# Patient Record
Sex: Female | Born: 1950
Health system: Southern US, Community
[De-identification: ages and names within clinical notes are randomized; demographics above are authoritative.]

## PROBLEM LIST (undated history)

## (undated) DIAGNOSIS — Z8679 Personal history of other diseases of the circulatory system: Secondary | ICD-10-CM

## (undated) DIAGNOSIS — S92909A Unspecified fracture of unspecified foot, initial encounter for closed fracture: Secondary | ICD-10-CM

## (undated) DIAGNOSIS — F419 Anxiety disorder, unspecified: Secondary | ICD-10-CM

## (undated) DIAGNOSIS — S42309A Unspecified fracture of shaft of humerus, unspecified arm, initial encounter for closed fracture: Secondary | ICD-10-CM

## (undated) DIAGNOSIS — S62609A Fracture of unspecified phalanx of unspecified finger, initial encounter for closed fracture: Secondary | ICD-10-CM

## (undated) DIAGNOSIS — K5792 Diverticulitis of intestine, part unspecified, without perforation or abscess without bleeding: Secondary | ICD-10-CM

## (undated) DIAGNOSIS — S065X9A Traumatic subdural hemorrhage with loss of consciousness of unspecified duration, initial encounter: Secondary | ICD-10-CM

## (undated) DIAGNOSIS — R011 Cardiac murmur, unspecified: Secondary | ICD-10-CM

## (undated) DIAGNOSIS — H269 Unspecified cataract: Secondary | ICD-10-CM

## (undated) DIAGNOSIS — I10 Essential (primary) hypertension: Secondary | ICD-10-CM

## (undated) DIAGNOSIS — M1990 Unspecified osteoarthritis, unspecified site: Secondary | ICD-10-CM

## (undated) DIAGNOSIS — G4700 Insomnia, unspecified: Secondary | ICD-10-CM

## (undated) HISTORY — PX: EYE SURGERY: SHX253

## (undated) HISTORY — DX: Insomnia, unspecified: G47.00

## (undated) HISTORY — DX: Diverticulitis of intestine, part unspecified, without perforation or abscess without bleeding: K57.92

## (undated) HISTORY — DX: Personal history of other diseases of the circulatory system: Z86.79

## (undated) HISTORY — PX: TONSILLECTOMY: SUR1361

## (undated) HISTORY — DX: Unspecified fracture of shaft of humerus, unspecified arm, initial encounter for closed fracture: S42.309A

## (undated) HISTORY — DX: Unspecified cataract: H26.9

## (undated) HISTORY — DX: Unspecified osteoarthritis, unspecified site: M19.90

## (undated) HISTORY — DX: Fracture of unspecified phalanx of unspecified finger, initial encounter for closed fracture: S62.609A

## (undated) HISTORY — DX: Essential (primary) hypertension: I10

## (undated) HISTORY — PX: COLON SURGERY: SHX602

## (undated) HISTORY — DX: Unspecified fracture of unspecified foot, initial encounter for closed fracture: S92.909A

## (undated) HISTORY — PX: COLONOSCOPY: SHX174

---

## 1998-03-30 ENCOUNTER — Emergency Department (HOSPITAL_COMMUNITY): Admission: EM | Admit: 1998-03-30 | Discharge: 1998-03-30 | Payer: Self-pay | Admitting: Emergency Medicine

## 1998-04-08 ENCOUNTER — Other Ambulatory Visit: Admission: RE | Admit: 1998-04-08 | Discharge: 1998-04-08 | Payer: Self-pay | Admitting: *Deleted

## 1999-05-06 ENCOUNTER — Other Ambulatory Visit: Admission: RE | Admit: 1999-05-06 | Discharge: 1999-05-06 | Payer: Self-pay | Admitting: *Deleted

## 2000-06-01 ENCOUNTER — Other Ambulatory Visit: Admission: RE | Admit: 2000-06-01 | Discharge: 2000-06-01 | Payer: Self-pay | Admitting: *Deleted

## 2001-05-22 ENCOUNTER — Other Ambulatory Visit: Admission: RE | Admit: 2001-05-22 | Discharge: 2001-05-22 | Payer: Self-pay | Admitting: *Deleted

## 2001-05-24 ENCOUNTER — Encounter: Payer: Self-pay | Admitting: *Deleted

## 2001-05-24 ENCOUNTER — Encounter: Admission: RE | Admit: 2001-05-24 | Discharge: 2001-05-24 | Payer: Self-pay | Admitting: *Deleted

## 2001-07-13 ENCOUNTER — Other Ambulatory Visit: Admission: RE | Admit: 2001-07-13 | Discharge: 2001-07-13 | Payer: Self-pay | Admitting: *Deleted

## 2001-11-23 ENCOUNTER — Encounter: Payer: Self-pay | Admitting: Surgery

## 2001-11-23 ENCOUNTER — Encounter: Admission: RE | Admit: 2001-11-23 | Discharge: 2001-11-23 | Payer: Self-pay | Admitting: Surgery

## 2002-01-09 ENCOUNTER — Other Ambulatory Visit: Admission: RE | Admit: 2002-01-09 | Discharge: 2002-01-09 | Payer: Self-pay | Admitting: *Deleted

## 2002-06-05 ENCOUNTER — Other Ambulatory Visit: Admission: RE | Admit: 2002-06-05 | Discharge: 2002-06-05 | Payer: Self-pay | Admitting: Obstetrics and Gynecology

## 2002-12-05 ENCOUNTER — Encounter: Admission: RE | Admit: 2002-12-05 | Discharge: 2002-12-05 | Payer: Self-pay | Admitting: Obstetrics and Gynecology

## 2002-12-05 ENCOUNTER — Encounter: Payer: Self-pay | Admitting: Obstetrics and Gynecology

## 2003-06-24 ENCOUNTER — Other Ambulatory Visit: Admission: RE | Admit: 2003-06-24 | Discharge: 2003-06-24 | Payer: Self-pay | Admitting: Obstetrics and Gynecology

## 2003-08-12 ENCOUNTER — Ambulatory Visit (HOSPITAL_COMMUNITY): Admission: RE | Admit: 2003-08-12 | Discharge: 2003-08-12 | Payer: Self-pay | Admitting: Orthopedic Surgery

## 2003-12-09 ENCOUNTER — Encounter: Admission: RE | Admit: 2003-12-09 | Discharge: 2003-12-09 | Payer: Self-pay | Admitting: Obstetrics and Gynecology

## 2004-11-27 ENCOUNTER — Ambulatory Visit: Payer: Self-pay | Admitting: Internal Medicine

## 2004-12-03 ENCOUNTER — Ambulatory Visit: Payer: Self-pay | Admitting: Internal Medicine

## 2005-01-14 ENCOUNTER — Encounter: Admission: RE | Admit: 2005-01-14 | Discharge: 2005-01-14 | Payer: Self-pay | Admitting: Internal Medicine

## 2006-01-17 ENCOUNTER — Encounter: Admission: RE | Admit: 2006-01-17 | Discharge: 2006-01-17 | Payer: Self-pay | Admitting: Obstetrics and Gynecology

## 2006-09-08 ENCOUNTER — Ambulatory Visit: Payer: Self-pay | Admitting: Internal Medicine

## 2006-09-08 LAB — CONVERTED CEMR LAB
AST: 23 units/L (ref 0–37)
Alkaline Phosphatase: 43 units/L (ref 39–117)
Chol/HDL Ratio, serum: 2.7
Creatinine, Ser: 0.9 mg/dL (ref 0.4–1.2)
Eosinophil percent: 3.4 % (ref 0.0–5.0)
Glomerular Filtration Rate, Af Am: 83 mL/min/{1.73_m2}
HCT: 42.9 % (ref 36.0–46.0)
HDL: 74.1 mg/dL (ref >39.0)
LDL DIRECT: 121.9 mg/dL
Leukocytes, UA: NEGATIVE
MCV: 90.2 fL (ref 78.0–100.0)
Neutro Abs: 2.8 10*3/uL (ref 1.4–7.7)
Neutrophils Relative %: 48.8 % (ref 43.0–77.0)
Nitrite: NEGATIVE
Potassium: 3.9 meq/L (ref 3.5–5.1)
RBC: 4.75 M/uL (ref 3.87–5.11)
RDW: 12.8 % (ref 11.5–14.6)
Sodium: 140 meq/L (ref 135–145)
Urobilinogen, UA: 0.2 (ref 0.0–1.0)
WBC: 5.8 10*3/uL (ref 4.5–10.5)

## 2006-09-15 ENCOUNTER — Ambulatory Visit: Payer: Self-pay | Admitting: Internal Medicine

## 2006-09-16 ENCOUNTER — Ambulatory Visit: Payer: Self-pay

## 2006-09-16 ENCOUNTER — Encounter: Payer: Self-pay | Admitting: Cardiology

## 2007-01-25 ENCOUNTER — Encounter: Admission: RE | Admit: 2007-01-25 | Discharge: 2007-01-25 | Payer: Self-pay | Admitting: Obstetrics and Gynecology

## 2007-11-07 ENCOUNTER — Telehealth: Payer: Self-pay | Admitting: Internal Medicine

## 2007-11-10 ENCOUNTER — Ambulatory Visit: Payer: Self-pay | Admitting: Internal Medicine

## 2007-11-10 LAB — CONVERTED CEMR LAB
ALT: 21 units/L (ref 0–35)
AST: 25 units/L (ref 0–37)
BUN: 16 mg/dL (ref 6–23)
Basophils Absolute: 0 10*3/uL (ref 0.0–0.1)
Bilirubin Urine: NEGATIVE
Bilirubin, Direct: 0.1 mg/dL (ref 0.0–0.3)
CO2: 31 meq/L (ref 19–32)
CRP, High Sensitivity: 1 (ref 0.00–5.00)
Calcium: 9.4 mg/dL (ref 8.4–10.5)
Creatinine, Ser: 0.7 mg/dL (ref 0.4–1.2)
Eosinophils Absolute: 0.3 10*3/uL (ref 0.0–0.6)
Glucose, Bld: 93 mg/dL (ref 70–99)
HDL: 72.9 mg/dL (ref >39.0)
LDL Cholesterol: 112 mg/dL — ABNORMAL HIGH (ref 0–99)
MCHC: 33.1 g/dL (ref 30.0–36.0)
Monocytes Relative: 9 % (ref 3.0–11.0)
Neutro Abs: 2.6 10*3/uL (ref 1.4–7.7)
Neutrophils Relative %: 46.6 % (ref 43.0–77.0)
Nitrite: NEGATIVE
Sodium: 141 meq/L (ref 135–145)
TSH: 2.1 microintl units/mL (ref 0.35–5.50)
Total Bilirubin: 1 mg/dL (ref 0.3–1.2)
Total Protein: 6.5 g/dL (ref 6.0–8.3)
Triglycerides: 65 mg/dL (ref 0–149)
Urobilinogen, UA: 0.2 (ref 0.0–1.0)
VLDL: 13 mg/dL (ref 0–40)
Vit D, 1,25-Dihydroxy: 41 (ref 30–89)
WBC: 5.6 10*3/uL (ref 4.5–10.5)

## 2007-11-15 ENCOUNTER — Encounter: Payer: Self-pay | Admitting: *Deleted

## 2007-11-15 DIAGNOSIS — S92909A Unspecified fracture of unspecified foot, initial encounter for closed fracture: Secondary | ICD-10-CM

## 2007-11-15 DIAGNOSIS — S42309A Unspecified fracture of shaft of humerus, unspecified arm, initial encounter for closed fracture: Secondary | ICD-10-CM | POA: Insufficient documentation

## 2007-11-15 DIAGNOSIS — M1990 Unspecified osteoarthritis, unspecified site: Secondary | ICD-10-CM | POA: Insufficient documentation

## 2007-11-15 DIAGNOSIS — S62609A Fracture of unspecified phalanx of unspecified finger, initial encounter for closed fracture: Secondary | ICD-10-CM | POA: Insufficient documentation

## 2007-11-15 DIAGNOSIS — Z9089 Acquired absence of other organs: Secondary | ICD-10-CM | POA: Insufficient documentation

## 2007-11-15 DIAGNOSIS — S2239XA Fracture of one rib, unspecified side, initial encounter for closed fracture: Secondary | ICD-10-CM | POA: Insufficient documentation

## 2007-11-15 DIAGNOSIS — Z8679 Personal history of other diseases of the circulatory system: Secondary | ICD-10-CM

## 2007-11-15 DIAGNOSIS — I10 Essential (primary) hypertension: Secondary | ICD-10-CM

## 2007-11-15 DIAGNOSIS — A389 Scarlet fever, uncomplicated: Secondary | ICD-10-CM | POA: Insufficient documentation

## 2007-11-15 HISTORY — DX: Unspecified fracture of shaft of humerus, unspecified arm, initial encounter for closed fracture: S42.309A

## 2007-11-15 HISTORY — DX: Personal history of other diseases of the circulatory system: Z86.79

## 2007-11-15 HISTORY — DX: Unspecified fracture of unspecified foot, initial encounter for closed fracture: S92.909A

## 2007-11-15 HISTORY — DX: Fracture of unspecified phalanx of unspecified finger, initial encounter for closed fracture: S62.609A

## 2007-11-15 HISTORY — DX: Essential (primary) hypertension: I10

## 2007-11-15 HISTORY — DX: Unspecified osteoarthritis, unspecified site: M19.90

## 2007-11-27 ENCOUNTER — Ambulatory Visit: Payer: Self-pay | Admitting: Internal Medicine

## 2008-01-09 ENCOUNTER — Telehealth: Payer: Self-pay | Admitting: Internal Medicine

## 2008-02-06 ENCOUNTER — Encounter: Admission: RE | Admit: 2008-02-06 | Discharge: 2008-02-06 | Payer: Self-pay | Admitting: Obstetrics and Gynecology

## 2008-06-06 ENCOUNTER — Telehealth: Payer: Self-pay | Admitting: Internal Medicine

## 2008-07-26 ENCOUNTER — Telehealth: Payer: Self-pay | Admitting: Internal Medicine

## 2008-09-13 LAB — HM DEXA SCAN

## 2008-12-10 ENCOUNTER — Encounter (INDEPENDENT_AMBULATORY_CARE_PROVIDER_SITE_OTHER): Payer: Self-pay | Admitting: *Deleted

## 2009-01-28 ENCOUNTER — Ambulatory Visit: Payer: Self-pay | Admitting: Internal Medicine

## 2009-02-06 ENCOUNTER — Encounter: Admission: RE | Admit: 2009-02-06 | Discharge: 2009-02-06 | Payer: Self-pay | Admitting: Obstetrics and Gynecology

## 2009-02-06 ENCOUNTER — Telehealth: Payer: Self-pay | Admitting: Internal Medicine

## 2009-02-11 ENCOUNTER — Ambulatory Visit: Payer: Self-pay | Admitting: Internal Medicine

## 2009-03-31 ENCOUNTER — Telehealth: Payer: Self-pay | Admitting: Internal Medicine

## 2009-05-05 ENCOUNTER — Telehealth: Payer: Self-pay | Admitting: Internal Medicine

## 2009-05-26 ENCOUNTER — Telehealth: Payer: Self-pay | Admitting: Internal Medicine

## 2009-06-10 ENCOUNTER — Ambulatory Visit: Payer: Self-pay | Admitting: Internal Medicine

## 2009-06-10 LAB — CONVERTED CEMR LAB
Bilirubin, Direct: 0 mg/dL (ref 0.0–0.3)
Cholesterol: 209 mg/dL — ABNORMAL HIGH (ref 0–200)
Eosinophils Relative: 3.6 % (ref 0.0–5.0)
Glucose, Bld: 89 mg/dL (ref 70–99)
HCT: 43.1 % (ref 36.0–46.0)
Leukocytes, UA: NEGATIVE
Lymphs Abs: 1.7 10*3/uL (ref 0.7–4.0)
MCHC: 34.3 g/dL (ref 30.0–36.0)
MCV: 92.4 fL (ref 78.0–100.0)
Monocytes Relative: 10.5 % (ref 3.0–12.0)
Nitrite: NEGATIVE
Potassium: 4.3 meq/L (ref 3.5–5.1)
RBC: 4.66 M/uL (ref 3.87–5.11)
RDW: 13 % (ref 11.5–14.6)
Sodium: 142 meq/L (ref 135–145)
Specific Gravity, Urine: 1.02 (ref 1.000–1.030)
TSH: 1.19 microintl units/mL (ref 0.35–5.50)
Triglycerides: 38 mg/dL (ref 0.0–149.0)
Urine Glucose: NEGATIVE mg/dL
Urobilinogen, UA: 0.2 (ref 0.0–1.0)
VLDL: 7.6 mg/dL (ref 0.0–40.0)
pH: 6 (ref 5.0–8.0)

## 2009-06-17 ENCOUNTER — Ambulatory Visit: Payer: Self-pay | Admitting: Internal Medicine

## 2009-06-17 DIAGNOSIS — G4700 Insomnia, unspecified: Secondary | ICD-10-CM | POA: Insufficient documentation

## 2009-06-17 HISTORY — DX: Insomnia, unspecified: G47.00

## 2009-08-21 ENCOUNTER — Telehealth: Payer: Self-pay | Admitting: Internal Medicine

## 2009-09-01 ENCOUNTER — Telehealth: Payer: Self-pay | Admitting: Internal Medicine

## 2009-10-28 ENCOUNTER — Telehealth: Payer: Self-pay | Admitting: Internal Medicine

## 2009-12-01 ENCOUNTER — Telehealth: Payer: Self-pay | Admitting: Internal Medicine

## 2010-02-18 ENCOUNTER — Encounter: Admission: RE | Admit: 2010-02-18 | Discharge: 2010-02-18 | Payer: Self-pay | Admitting: Obstetrics and Gynecology

## 2010-04-22 ENCOUNTER — Telehealth (INDEPENDENT_AMBULATORY_CARE_PROVIDER_SITE_OTHER): Payer: Self-pay | Admitting: *Deleted

## 2010-04-24 ENCOUNTER — Telehealth: Payer: Self-pay | Admitting: Internal Medicine

## 2010-04-27 ENCOUNTER — Telehealth: Payer: Self-pay | Admitting: Internal Medicine

## 2010-05-08 ENCOUNTER — Telehealth (INDEPENDENT_AMBULATORY_CARE_PROVIDER_SITE_OTHER): Payer: Self-pay | Admitting: *Deleted

## 2010-06-22 ENCOUNTER — Telehealth: Payer: Self-pay | Admitting: Internal Medicine

## 2010-10-15 NOTE — Progress Notes (Signed)
  Phone Note Refill Request Message from:  Fax from Pharmacy on June 22, 2010 10:00 AM  Refills Requested: Medication #1:  AMBIEN 10 MG TABS Take 1 tab by mouth at bedtime as needed   Last Refilled: 03/18/2010 Last ov was 06/17/2009, please Advise refill for [facility] [address]  Initial call taken by: Ami Bullins CMA,  June 22, 2010 10:01 AM  Follow-up for Phone Call        ok for refill with 5 add'l Follow-up by: Jacques Navy MD,  June 22, 2010 12:26 PM    Prescriptions: AMBIEN 10 MG TABS (ZOLPIDEM TARTRATE) Take 1 tab by mouth at bedtime as needed  #30 x 5   Entered by:   Ami Bullins CMA   Authorized by:   Jacques Navy MD   Signed by:   Bill Salinas CMA on 06/22/2010   Method used:   Telephoned to ...       [facility] [address] [phone]* (retail)       [address]       [place], [place]  [address]       Ph: [phone] or [phone]       Fax: [phone]   RxID:   [id number]

## 2010-10-15 NOTE — Progress Notes (Signed)
Summary: CALL  Phone Note Call from Patient   Caller: [phone]  Summary of Call: Patient is requesting a call back.  Initial call taken by: Lamar Sprinkles, CMA,  April 27, 2010 9:36 AM  Follow-up for Phone Call        Spoke w/pt. 1. She is [employer]. Med records needed to be sent, I see [facility] is taking care of this as of 04/22/10 phone note. Pt informed 2. She is staying out of town for short period of time. Pt will call directly if needs any refills and provide local pharm info.  Follow-up by: Lamar Sprinkles, CMA,  April 27, 2010 10:11 AM

## 2010-10-15 NOTE — Progress Notes (Signed)
Summary: Sue [name] refill  Phone Note Refill Request Message from:  Fax from Pharmacy on December 01, 2009 4:30 PM  Refills Requested: Medication #1:  AMBIEN 10 MG TABS Take 1 tab by mouth at bedtime as needed   Last Refilled: 10/28/2009 Is this ok to refill  Initial call taken by: Rock Nephew CMA,  December 01, 2009 4:30 PM  Follow-up for Phone Call        ok x 12 Follow-up by: Jacques Navy MD,  December 02, 2009 1:10 PM    Prescriptions: AMBIEN 10 MG TABS (ZOLPIDEM TARTRATE) Take 1 tab by mouth at bedtime as needed  #30 x 6   Entered by:   Lamar Sprinkles, CMA   Authorized by:   Jacques Navy MD   Signed by:   Lamar Sprinkles, CMA on 12/02/2009   Method used:   Telephoned to ...       [facility]  [address] [phone]* (retail)       [address]       [place], [place]  [address]       Ph: [phone] or [phone]       Fax: [phone]   RxID:   [id number]

## 2010-10-15 NOTE — Progress Notes (Signed)
  Phone Note Other Incoming   Request: Send information Summary of Call: Request for records received from [facility]. Request forwarded to [employer].     

## 2010-10-15 NOTE — Progress Notes (Signed)
Summary: Xanax refill  Phone Note Refill Request Message from:  Fax from Pharmacy on October 28, 2009 2:46 PM  Refills Requested: Medication #1:  XANAX 0.25 MG  TABS 1 by mouth at bedtime as needed   Dosage confirmed as above?Dosage Confirmed Next Appointment Scheduled: none Initial call taken by: Lucious Groves,  October 28, 2009 2:46 PM  Follow-up for Phone Call        OK for refill x 5 Follow-up by: Jacques Navy MD,  October 28, 2009 6:36 PM    Prescriptions: Prudy Feeler 0.25 MG  TABS (ALPRAZOLAM) 1 by mouth at bedtime as needed  #30 x 5   Entered by:   Lucious Groves   Authorized by:   Jacques Navy MD   Signed by:   Lucious Groves on 10/29/2009   Method used:   Telephoned to ...       [facility] [address] [phone]* (retail)       [address]       [place], [place]  [address]       Ph: [phone] or [phone]       Fax: [phone]   RxID:   [id number]

## 2010-10-15 NOTE — Progress Notes (Signed)
  Phone Note Other Incoming   Request: Send information Summary of Call: Request for records received from [facility]. Request forwarded to [employer].

## 2010-10-15 NOTE — Progress Notes (Signed)
Summary: Xanax refill  Phone Note Refill Request Message from:  Fax from Pharmacy on April 24, 2010 4:11 PM  Refills Requested: Medication #1:  XANAX 0.25 MG  TABS 1 by mouth at bedtime as needed Next Appointment Scheduled: none Initial call taken by: Lucious Groves CMA,  April 24, 2010 4:11 PM  Follow-up for Phone Call        ok fro refill x 5 Follow-up by: Jacques Navy MD,  April 24, 2010 4:37 PM    Prescriptions: Prudy Feeler 0.25 MG  TABS (ALPRAZOLAM) 1 by mouth at bedtime as needed  #30 x 5   Entered by:   Lucious Groves CMA   Authorized by:   Jacques Navy MD   Signed by:   Lucious Groves CMA on 04/24/2010   Method used:   Telephoned to ...       [facility] [address] [phone]* (retail)       [address]       [place], [place]  [address]       Ph: [phone] or [phone]       Fax: [phone]   RxID:   [id number]

## 2010-10-30 ENCOUNTER — Telehealth: Payer: Self-pay | Admitting: Internal Medicine

## 2010-11-02 ENCOUNTER — Telehealth: Payer: Self-pay | Admitting: Internal Medicine

## 2010-11-03 ENCOUNTER — Telehealth (INDEPENDENT_AMBULATORY_CARE_PROVIDER_SITE_OTHER): Payer: Self-pay | Admitting: *Deleted

## 2010-11-10 NOTE — Progress Notes (Signed)
Summary: PA-Celebrex  Phone Note From Pharmacy   Summary of Call: PA-Celebrex, called [facility] @ [phone], awaiting form. Dagoberto Reef  November 03, 2010 4:01 PM Form to Dr Debby Bud to complete. Dagoberto Reef  November 03, 2010 4:05 PM   Follow-up for Phone Call        PA is required on Celebrex. I do not see that pt has tried alt rx's. What do you advise I put on PA form - it is req supporting info as to why this pt needs this rx and what they have tried in the past.  Follow-up by: Lamar Sprinkles, CMA,  November 04, 2010 1:26 PM  Additional Follow-up for Phone Call Additional follow up Details #1::        [facility] drug of choice - call and offer her a trial of meloxicam 7.5mg  once daily or she may need to pay for the celebrex since we will not get PA with her current record. Additional Follow-up by: Jacques Navy MD,  November 04, 2010 5:02 PM    Additional Follow-up for Phone Call Additional follow up Details #2::    No answer.........................Marland KitchenLamar Sprinkles, CMA  November 04, 2010 5:58 PM   Additional Follow-up for Phone Call Additional follow up Details #3:: Details for Additional Follow-up Action Taken: spoke with pt and informed her. She states that she will pay out of pocket if medication is not too expensive. I informed pt that I would send in refill and if medication was too expensive she will call to see if we have samples Additional Follow-up by: Ami Bullins CMA,  November 06, 2010 8:48 AM  Prescriptions: CELEBREX 200 MG  CAPS (CELECOXIB) Take as needed and as directed.- pt overdur for office visit  #30 Capsule x 1   Entered by:   Ami Bullins CMA   Authorized by:   Jacques Navy MD   Signed by:   Bill Salinas CMA on 11/06/2010   Method used:   Electronically to        [facility] [address] [phone]* (retail)       [address]       [place], [place]  [address]       Ph: [phone] or [phone]       Fax: [phone]   RxID:    [id number]

## 2010-11-10 NOTE — Progress Notes (Signed)
Summary: Ambien  Phone Note From Pharmacy   Caller: [facility] [address] [phone]* Summary of Call: per pharm pt has been taking 1/2 and pharm is unable to get a scored tablet. They are requesting to change  to Ambien 5mg  take up to 2 at bedtime as needed # 60 with 4Rf that were left on file. Initial call taken by: Lanier Prude, [facility]),  November 02, 2010 3:09 PM    New/Updated Medications: AMBIEN 5 MG TABS (ZOLPIDEM TARTRATE) take up to 2 at bedtime as needed Prescriptions: AMBIEN 5 MG TABS (ZOLPIDEM TARTRATE) take up to 2 at bedtime as needed  #60 x 4   Entered by:   Lanier Prude, [facility])   Authorized by:   Jacques Navy MD   Signed by:   Lanier Prude, CMA(AAMA) on 11/02/2010   Method used:   Historical   RxID:   [id number]

## 2010-11-10 NOTE — Progress Notes (Signed)
Summary: RF  Phone Note Refill Request Message from:  Pharmacy  Refills Requested: Medication #1:  XANAX 0.25 MG  TABS 1 by mouth at bedtime as needed [facility] [phone]  Initial call taken by: Lamar Sprinkles, CMA,  October 30, 2010 5:53 PM  Follow-up for Phone Call        ok to refill x 5 Follow-up by: Jacques Navy MD,  October 30, 2010 5:58 PM    Prescriptions: Prudy Feeler 0.25 MG  TABS (ALPRAZOLAM) 1 by mouth at bedtime as needed  #30 x 5   Entered by:   Lamar Sprinkles, CMA   Authorized by:   Jacques Navy MD   Signed by:   Lamar Sprinkles, CMA on 11/02/2010   Method used:   Telephoned to ...       [facility]  [facility] [address] [phone]* (retail)       [address]       [place], [place]  [address]       Ph: [phone] or [phone]       Fax: [phone]   RxID:   [id number]

## 2010-11-10 NOTE — Progress Notes (Signed)
Summary: Refill request  Phone Note From Pharmacy   Caller: [facility]  [facility] [address] [phone]* Call For: Alprazolam 0.25mg   Summary of Call: Pharmacy is requesting Alprazolam 0.25mg  tablet WGN:FAOZ 1 tablet QHs  as needed Disp: 30 w/3 refills/sls,cma Initial call taken by: Burnard Leigh [facility]),  November 02, 2010 2:33 PM  Follow-up for Phone Call        DONE - see other phone note Follow-up by: Lamar Sprinkles, CMA,  November 02, 2010 6:39 PM

## 2011-01-11 ENCOUNTER — Other Ambulatory Visit: Payer: Self-pay | Admitting: Obstetrics & Gynecology

## 2011-01-11 DIAGNOSIS — Z1231 Encounter for screening mammogram for malignant neoplasm of breast: Secondary | ICD-10-CM

## 2011-01-29 NOTE — Assessment & Plan Note (Signed)
[facility]                           PRIMARY CARE OFFICE NOTE   GIFT, RUECKERT                    MRN:          [id number]  DATE:09/15/2006                            DOB:          [birth year]    Sue [name] is a delightful 60 year old woman who presents today for  followup evaluation and exam. She was last seen in the office December 03, 2004, please see that complete dictation for past medical history and  family history.   The patient reports that she is feeling well and doing well with no  active medical complaints.   INTERVAL SOCIAL HISTORY:  The patient continues in a 6-year relationship  which is rewarding and satisfying. She does report she continues to go  to [facility] [place] on an annual basis for rejuvenation, which is a  solo trip. 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   CURRENT MEDICATIONS:  1. HCTZ 12.5 mg daily.  2. Potassium daily.  3. Multivitamin.  4. Calcium.  5. Toprol XL 25 mg 1/2-tablet daily.  6. Celebrex 200 mg daily p.r.n.  7. Menest estrogen 1/2 tablet daily.  8. Glucosamine daily.  9. Xanax 0.25 1/2-tablet q.h.s. p.r.n.  10.Ambien 10 mg, a fraction of a table q.h.s. p.r.n.   HEALTH MAINTENANCE:  The patient is current and up-to-date with Dr.  Floyde Parkins with her last pelvic and Pap in May 2007, last  mammogram in May 2007 which was a normal study. The patient's last  colonoscopy was June 2000 by Dr. Lina Sar and she would be considered  due for followup study in 2010.   REVIEW OF SYSTEMS:  The patient has had no constitutional problems and  reports her sleep habit is better. She has had an eye exam in the last  12 months which was normal. No ENT or dental problems. She has  occasional mild flutter with no history of mitral valve prolapse with  her last 2-D echo from January 1992 which revealed mild redundancy of  the anterior leaflet with mild  prolapse of both the anterior and  posterior leaflets and otherwise a normal study. She has had no  respiratory, GI, GU or musculoskeletal complaints.   PHYSICAL EXAMINATION:  VITAL SIGNS:  Temperature was 98.1, blood  pressure 125/77, pulse 53, weight 132.  GENERAL:  This is a well-nourished, well-developed woman looking younger  than her stated chronologic age in no acute distress.  HEENT:  Normocephalic, atraumatic. EACs and TMs were unremarkable.  Oropharynx with native dentition in good repair. No buccal or palatal  lesions were noted. The posterior pharynx was clear. Conjunctiva and  sclera was clear. PERRLA, EOMI. Funduscopic exam was unremarkable.  NECK:  Supple without thyromegaly. No adenopathy was noted in the  cervical or supraclavicular regions.  CHEST:  No CVA tenderness.  LUNGS:  Clear with no rales, wheezes or rhonchi.  BREASTS:  Deferred to gynecology.  CARDIOVASCULAR:  2+ radial pulse, no JVD or carotid bruits. She had a  quiet precordium with a regular rate and rhythm without murmurs, rubs or  gallops.  ABDOMEN:  Soft, no guarding, no rebound, no organosplenomegaly was  noted.  PELVIC/RECTAL:  Deferred to gynecology and GI.  EXTREMITIES:  Without clubbing, cyanosis, edema or deformity.  NEUROLOGIC:  Nonfocal.   DATABASE:  Hemoglobin was 15 grams, white count was 5800 with a normal  differential. Chemistries were normal with a blood sugar of 95. Kidney  function normal with a creatinine of 0.9 and GFR of 69, liver functions  were normal. Cholesterol was 201, triglycerides 47, HDL was 74.1. LDL  was 121.9. Thyroid function was normal with a TSH of 2.01, urinalysis  was negative.   ASSESSMENT/PLAN:  1. Hypertension. Excellent control on minimal medications. The patient      will continue on this regimen.  2. Osteoarthritis with known cervical spurs. The patient is actually      doing very well, remaining very active. She has no limitations in      her daily  activities and gets good relief with Celebrex when needed      and glucosamine.  3. Health maintenance. The patient is current and up-to-date with her      gynecologist and is on hormone replacement therapy. I believe she      has had a bone density study in the last 2 years which was      excellent which is concordant with her physical appearance and      level of activity. The patient did have a stress study in 2000      which was normal and unremarkable. The patient would be a candidate      for followup 2-D echo to evaluate and follow her mitral valve      prolapse and this is scheduled for her at [facility] for      Friday January 4.   The patient is given a tetanus booster at today's visit which is  necessary and appropriate given her equestrian hobbies and the fact that  she is at risk for soil contaminated injury.   In summary, this is a delightful patient who is medically fit and in  good condition with a normal examination and normal labs. She is asked  to return to see me for a general physical examination in 2 years. She  should see me otherwise on an as needed basis.     Sue Gess Norins, MD  Electronically Signed   MEN/MedQ  DD: 09/16/2006  DT: 09/16/2006  Job #: [id number]   cc:   Elisabeth Most A. Rosalio Macadamia, M.D.

## 2011-02-04 ENCOUNTER — Telehealth: Payer: Self-pay | Admitting: *Deleted

## 2011-02-04 ENCOUNTER — Other Ambulatory Visit: Payer: Self-pay | Admitting: *Deleted

## 2011-02-04 NOTE — Telephone Encounter (Signed)
[facility]ax from [facility] [phone] Zolpidem Tartrate 5 mg tablet. SIG take up to 2 tablets at bedtime QTY 60 last fill 11/02/2010. Please Advise refills

## 2011-02-05 MED ORDER — ZOLPIDEM TARTRATE 5 MG PO TABS: 5.0000 mg | ORAL_TABLET | Freq: Two times a day (BID) | ORAL | Status: DC | PRN

## 2011-02-05 NOTE — Telephone Encounter (Signed)
Ok for refill x 5. Reminder: due for ov/cpx Oct '12

## 2011-02-23 ENCOUNTER — Ambulatory Visit: Payer: Self-pay

## 2011-02-24 ENCOUNTER — Ambulatory Visit
Admission: RE | Admit: 2011-02-24 | Discharge: 2011-02-24 | Disposition: A | Discharge status: Home / Self Care | Payer: PRIVATE HEALTH INSURANCE | Source: Ambulatory Visit | Attending: Obstetrics & Gynecology | Admitting: Obstetrics & Gynecology

## 2011-02-24 DIAGNOSIS — Z1231 Encounter for screening mammogram for malignant neoplasm of breast: Secondary | ICD-10-CM

## 2011-05-10 ENCOUNTER — Telehealth: Payer: Self-pay | Admitting: *Deleted

## 2011-05-10 MED ORDER — ALPRAZOLAM 0.25 MG PO TABS: 0.2500 mg | ORAL_TABLET | Freq: Every evening | ORAL | Status: DC | PRN

## 2011-05-10 NOTE — Telephone Encounter (Signed)
Request for Alprazolam 0.25 mg QTY 30 SIG 1 tablet at bedtime prn  Last filled 03/08/2011. Please Advise refills

## 2011-05-10 NOTE — Telephone Encounter (Signed)
Ok with 5 refills 

## 2011-07-15 ENCOUNTER — Telehealth: Payer: Self-pay | Admitting: *Deleted

## 2011-07-15 ENCOUNTER — Ambulatory Visit: Payer: PRIVATE HEALTH INSURANCE

## 2011-07-15 ENCOUNTER — Other Ambulatory Visit: Payer: Self-pay | Admitting: Internal Medicine

## 2011-07-15 ENCOUNTER — Other Ambulatory Visit: Payer: PRIVATE HEALTH INSURANCE

## 2011-07-15 DIAGNOSIS — Z202 Contact with and (suspected) exposure to infections with a predominantly sexual mode of transmission: Secondary | ICD-10-CM

## 2011-07-15 DIAGNOSIS — Z1159 Encounter for screening for other viral diseases: Secondary | ICD-10-CM

## 2011-07-15 DIAGNOSIS — Z0000 Encounter for general adult medical examination without abnormal findings: Secondary | ICD-10-CM

## 2011-07-15 LAB — HEPATIC FUNCTION PANEL
AST: 23 U/L (ref 0–37)
Alkaline Phosphatase: 47 U/L (ref 39–117)
Total Protein: 6.5 g/dL (ref 6.0–8.3)

## 2011-07-15 LAB — CBC WITH DIFFERENTIAL/PLATELET
Basophils Absolute: 0 10*3/uL (ref 0.0–0.1)
Basophils Relative: 0.4 % (ref 0.0–3.0)
Eosinophils Relative: 4.3 % (ref 0.0–5.0)
HCT: 41.2 % (ref 36.0–46.0)
Hemoglobin: 14 g/dL (ref 12.0–15.0)
Lymphocytes Relative: 38.4 % (ref 12.0–46.0)
Lymphs Abs: 2 10*3/uL (ref 0.7–4.0)
MCHC: 33.9 g/dL (ref 30.0–36.0)
Neutro Abs: 2.6 10*3/uL (ref 1.4–7.7)
RBC: 4.45 Mil/uL (ref 3.87–5.11)
WBC: 5.3 10*3/uL (ref 4.5–10.5)

## 2011-07-15 LAB — BASIC METABOLIC PANEL
CO2: 29 mEq/L (ref 19–32)
Chloride: 104 mEq/L (ref 96–112)
Creatinine, Ser: 0.9 mg/dL (ref 0.4–1.2)
Potassium: 3.9 mEq/L (ref 3.5–5.1)
Sodium: 140 mEq/L (ref 135–145)

## 2011-07-15 LAB — URINALYSIS
Leukocytes, UA: NEGATIVE
Nitrite: NEGATIVE
Specific Gravity, Urine: 1.015 (ref 1.000–1.030)

## 2011-07-15 LAB — LIPID PANEL
LDL Cholesterol: 120 mg/dL — ABNORMAL HIGH (ref 0–99)
Triglycerides: 29 mg/dL (ref 0.0–149.0)
VLDL: 5.8 mg/dL (ref 0.0–40.0)

## 2011-07-15 LAB — TSH: TSH: 2.33 u[IU]/mL (ref 0.35–5.50)

## 2011-07-15 NOTE — Telephone Encounter (Signed)
K. Order entered 

## 2011-07-15 NOTE — Telephone Encounter (Signed)
Pt had lab work drawn this morning for her physical. She came up and filled out walk in form stating that she would like to be tested for Hep C. Can I fill out add on form for this test to be ran. Please Advise

## 2011-07-20 ENCOUNTER — Ambulatory Visit (INDEPENDENT_AMBULATORY_CARE_PROVIDER_SITE_OTHER): Payer: PRIVATE HEALTH INSURANCE | Admitting: Internal Medicine

## 2011-07-20 VITALS — BP 110/70 | HR 65 | Temp 98.3°F | Wt 131.0 lb

## 2011-07-20 DIAGNOSIS — Z0000 Encounter for general adult medical examination without abnormal findings: Secondary | ICD-10-CM

## 2011-07-20 DIAGNOSIS — M1990 Unspecified osteoarthritis, unspecified site: Secondary | ICD-10-CM

## 2011-07-20 DIAGNOSIS — G4700 Insomnia, unspecified: Secondary | ICD-10-CM

## 2011-07-20 DIAGNOSIS — Z136 Encounter for screening for cardiovascular disorders: Secondary | ICD-10-CM

## 2011-07-20 DIAGNOSIS — I10 Essential (primary) hypertension: Secondary | ICD-10-CM

## 2011-07-20 MED ORDER — DESOXIMETASONE 0.25 % EX OINT: TOPICAL_OINTMENT | CUTANEOUS | Status: DC

## 2011-07-22 ENCOUNTER — Encounter: Payer: Self-pay | Admitting: Internal Medicine

## 2011-07-22 NOTE — Assessment & Plan Note (Signed)
Relatively minor symptoms well controlled with prn Celebrex

## 2011-07-22 NOTE — Assessment & Plan Note (Signed)
Interval medical history is benign. Physical exam is normal. She is current with gynecologist and mammography. She is current with colorectal cancer screening with last exam June '10. Lab results are in normal range. Immunizations: Tetanus Jan '08; declines flu and shingles vaccines.  In summary- a delightful woman who is medically stable and healthy. She is asked to return as needed or in 2 years for routine exam.

## 2011-07-22 NOTE — Assessment & Plan Note (Signed)
Continues to be a poor sleeper but she does well enough to be highly functional. Will still take the occasional small dose of alprazolam as needed.

## 2011-07-22 NOTE — Assessment & Plan Note (Signed)
BP Readings from Last 3 Encounters:  07/20/11 110/70  06/17/09 136/72  11/27/07 128/74   Good control on very low dose diuretic. Renal function and electrolytes are normal.  Plan - continue present regimen

## 2011-07-22 NOTE — Progress Notes (Signed)
Subjective:    Patient ID: Sue [name], female    DOB: 01/01/1951, 60 y.o.   MRN: [id number]  HPI Ms. Escajeda presents for routine medical exam. In the interval since her last exam she has been doing well with no major illness, no injury and no surgery. She remains a very active sportswoman. On occasion she will have sleep trouble for which she takes low dose medication. On occasion she will take NSAIDs for generalized discomfort. She is current with her gynecologist and has kept current with mammography.  Past Medical History  Diagnosis Date  . FRACTURE, ARM, LEFT 11/15/2007  . FRACTURE, FOOT 11/15/2007  . FRACTURE, INDEX FINGER, LEFT 11/15/2007  . HYPERTENSION 11/15/2007  . INSOMNIA, CHRONIC 06/17/2009  . MITRAL VALVE PROLAPSE, HX OF 11/15/2007  . OSTEOARTHRITIS 11/15/2007  . RIB FRACTURE 11/15/2007  . Scarlet fever 11/15/2007  . TONSILLECTOMY, HX OF 11/15/2007   Past Surgical History  Procedure Date  . Tonsillectomy    Family History  Problem Relation Age of Onset  . Parkinsonism Other    History   Social History  . Marital Status: Divorced    Spouse Name: N/A    Number of Children: N/A  . Years of Education: N/A   Occupational History  . Not on file.   Social History Main Topics  . Smoking status: Not on file  . Smokeless tobacco: Not on file  . Alcohol Use:   . Drug Use:   . Sexually Active:    Other Topics Concern  . Not on file   Social History Narrative   [employer] [place] then [place]; remarried '90 - 4 yrs divorced. 1 boy - '75; 1 daughter '83. Single partner since 2001. Socially active, riding horses not hunting but jumping little jumps. Lost her mother Spring '12 - doing OK with loss.       Review of Systems Constitutional:  Negative for fever, chills, activity change and unexpected weight change.  HEENT:  Negative for hearing loss, ear pain, congestion, neck stiffness and postnasal drip. Negative for sore throat or swallowing  problems. Negative for dental complaints.   Eyes: Negative for vision loss or change in visual acuity.  Respiratory: Negative for chest tightness and wheezing. Negative for DOE.   Cardiovascular: Negative for chest pain or palpitations. No decreased exercise tolerance Gastrointestinal: No change in bowel habit. No bloating or gas. No reflux or indigestion Genitourinary: Negative for urgency, frequency, flank pain and difficulty urinating.  Musculoskeletal: Negative for myalgias, back pain, arthralgias and gait problem.  Neurological: Negative for dizziness, tremors, weakness and headaches.  Hematological: Negative for adenopathy.  Psychiatric/Behavioral: Negative for behavioral problems and dysphoric mood.       Objective:   Physical Exam Vitals reviewed -  normal. Gen'l: well nourished, well developed white woman in no distress HEENT - Gays/AT, EACs/TMs normal, oropharynx with native dentition in good condition, no buccal or palatal lesions, posterior pharynx clear, mucous membranes moist. C&S clear, PERRLA, fundi - normal Neck - supple, no thyromegaly Nodes- negative submental, cervical, supraclavicular regions Chest - no deformity, no CVAT Lungs - cleat without rales, wheezes. No increased work of breathing Breast - deferred to gyn Cardiovascular - regular rate and rhythm, quiet precordium, no murmurs, rubs or gallops, 2+ radial, DP and PT pulses Abdomen - BS+ x 4, no HSM, no guarding or rebound or tenderness Pelvic - deferred to gyn Rectal - deferred to gyn Extremities - no clubbing, cyanosis, edema or deformity.  Neuro -  A&O x 3, CN II-XII normal, motor strength normal and equal, DTRs 2+ and symmetrical biceps, radial, and patellar tendons. Cerebellar - no tremor, no rigidity, fluid movement and normal gait. Derm - Head, neck, back, abdomen and extremities without suspicious lesions  Lab Results  Component Value Date   WBC 5.3 07/15/2011   HGB 14.0 07/15/2011   HCT 41.2 07/15/2011     PLT 265.0 07/15/2011   GLUCOSE 81 07/15/2011   CHOL 197 07/15/2011   TRIG 29.0 07/15/2011   HDL 71.60 07/15/2011   LDLDIRECT 131.1 06/10/2009   LDLCALC 120* 07/15/2011   ALT 19 07/15/2011   AST 23 07/15/2011   NA 140 07/15/2011   K 3.9 07/15/2011   CL 104 07/15/2011   CREATININE 0.9 07/15/2011   BUN 22 07/15/2011   CO2 29 07/15/2011   TSH 2.33 07/15/2011           Assessment & Plan:  Apthous ulcer - minor ulcer following halloween indiscretion  Plan - topicort 0.25% ointment to ulcer bid. Marland Kitchen

## 2011-07-31 ENCOUNTER — Encounter: Payer: Self-pay | Admitting: Internal Medicine

## 2011-08-12 ENCOUNTER — Telehealth: Payer: Self-pay | Admitting: *Deleted

## 2011-08-12 DIAGNOSIS — N83299 Other ovarian cyst, unspecified side: Secondary | ICD-10-CM

## 2011-08-12 NOTE — Telephone Encounter (Signed)
Pt had question on CA125 test for ovarian cancer, pt would like to speak with you directly

## 2011-08-16 NOTE — Telephone Encounter (Signed)
Patient called back & said thank-you for trying to get in touch w/her and sorry you haven't been able to reach her and could you try again on her mobile at [phone].

## 2011-08-16 NOTE — Telephone Encounter (Signed)
Called cell #. Discussed test and its value. She wishes to go ahead. Order entered.

## 2011-08-19 ENCOUNTER — Other Ambulatory Visit: Payer: PRIVATE HEALTH INSURANCE

## 2011-08-19 DIAGNOSIS — Z202 Contact with and (suspected) exposure to infections with a predominantly sexual mode of transmission: Secondary | ICD-10-CM

## 2011-08-20 LAB — HEPATITIS C ANTIBODY: HCV Ab: NEGATIVE

## 2011-08-23 ENCOUNTER — Encounter: Payer: Self-pay | Admitting: Internal Medicine

## 2011-08-26 ENCOUNTER — Other Ambulatory Visit: Payer: Self-pay | Admitting: *Deleted

## 2011-08-26 MED ORDER — METOPROLOL SUCCINATE ER 25 MG PO TB24: 25.0000 mg | ORAL_TABLET | Freq: Every day | ORAL | Status: DC

## 2011-08-26 NOTE — Telephone Encounter (Signed)
Request for Metoprolol refill. Done. LMOM to inform patient.

## 2011-09-21 ENCOUNTER — Telehealth: Payer: Self-pay | Admitting: *Deleted

## 2011-09-21 DIAGNOSIS — N83209 Unspecified ovarian cyst, unspecified side: Secondary | ICD-10-CM

## 2011-09-21 NOTE — Telephone Encounter (Signed)
Patient requesting results of CA125 labs. [facility] reports they did not receive this lab. Inquiry to [facility] lab, spoke w/Vickie; stated that lab was not drawn due to error in order entry, stated order was not placed as 'future' and that if this was corrected and the patient came back in to have lab drawn that it would be sent out to [facility]. Called patient to inform that we had a "error in transition with the lab" and we apologized to have inconvenienced her and to have not gotten this information to her earlier. Informed patient that order has been re-placed and she just needs to come back in to lab at her convenience & we will get this matter taken care of. Inform patient that this matter has been brought to the attention of our manager and that I will be sending detailed email as well; Pt request 'no charge' for this test due to our error, informed Pt that I would include this request in my email to manager.

## 2011-10-04 ENCOUNTER — Other Ambulatory Visit: Payer: PRIVATE HEALTH INSURANCE

## 2011-10-04 DIAGNOSIS — N83209 Unspecified ovarian cyst, unspecified side: Secondary | ICD-10-CM

## 2011-10-05 ENCOUNTER — Encounter: Payer: Self-pay | Admitting: Internal Medicine

## 2011-10-05 LAB — CA 125: CA 125: 6.8 U/mL (ref 0.0–30.2)

## 2011-11-30 ENCOUNTER — Other Ambulatory Visit: Payer: Self-pay | Admitting: Internal Medicine

## 2011-11-30 NOTE — Telephone Encounter (Signed)
Celebrex Prn request [last refill 02.24.12 #30x1]

## 2011-12-20 ENCOUNTER — Other Ambulatory Visit: Payer: Self-pay | Admitting: *Deleted

## 2011-12-20 MED ORDER — ZOLPIDEM TARTRATE 5 MG PO TABS: 5.0000 mg | ORAL_TABLET | Freq: Two times a day (BID) | ORAL | Status: DC | PRN

## 2011-12-20 MED ORDER — ALPRAZOLAM 0.25 MG PO TABS: 0.2500 mg | ORAL_TABLET | Freq: Every evening | ORAL | Status: DC | PRN

## 2011-12-20 NOTE — Telephone Encounter (Signed)
Alprazolam request [last refill 08.27.12 #60x5] Zolpidem request [last refill 05.25.12 #60x5]

## 2011-12-20 NOTE — Telephone Encounter (Signed)
Done

## 2011-12-20 NOTE — Telephone Encounter (Signed)
Ok to refill both zolpidem and alprazolam x 5

## 2011-12-23 ENCOUNTER — Telehealth: Payer: Self-pay | Admitting: *Deleted

## 2011-12-23 NOTE — Telephone Encounter (Signed)
Celebex is not covered under patient's insurance plan, acceptable alternatives are as follows: Mobic, Naproxen, Voltaren are covered. Please advise.

## 2011-12-31 NOTE — Telephone Encounter (Signed)
Recommend meloxicam 7.5 mg as an alternative to celebrex.  In european trials this was considered a COX II drug, as is celebrex. It is generally well tolerated and effective.

## 2012-01-04 MED ORDER — CELECOXIB 200 MG PO CAPS: 200.0000 mg | ORAL_CAPSULE | Freq: Every day | ORAL | Status: DC | PRN

## 2012-01-04 NOTE — Telephone Encounter (Signed)
Celebrex D/C per MEN; new Rx to pharmacy: After speaking with patient, I was informed that she has been paying out of pocket for Celebrex and would like to continue to; asked pt to please inform pharmacy of this, so no further prior authorizations will be requested, pt agreed. Added Celebrex back to patient's med list at patient request/SLS

## 2012-02-01 ENCOUNTER — Other Ambulatory Visit: Payer: Self-pay | Admitting: Internal Medicine

## 2012-02-01 ENCOUNTER — Other Ambulatory Visit: Payer: Self-pay | Admitting: *Deleted

## 2012-02-01 MED ORDER — HYDROCHLOROTHIAZIDE 25 MG PO TABS: 12.5000 mg | ORAL_TABLET | Freq: Every day | ORAL | Status: DC

## 2012-02-01 NOTE — Telephone Encounter (Signed)
REFILL MED CALLED TO [facility].

## 2012-02-02 NOTE — Telephone Encounter (Signed)
rX  SENT TO PHARMACY 

## 2012-02-03 ENCOUNTER — Telehealth: Payer: Self-pay | Admitting: *Deleted

## 2012-02-03 ENCOUNTER — Other Ambulatory Visit: Payer: Self-pay | Admitting: Obstetrics & Gynecology

## 2012-02-03 DIAGNOSIS — Z1231 Encounter for screening mammogram for malignant neoplasm of breast: Secondary | ICD-10-CM

## 2012-02-03 NOTE — Telephone Encounter (Signed)
Patient called to state [facility] Of   Celebrex prior authorization. .States she will pay out of pocket for this as she does not take it that often to go to the trouble of getting a PA.

## 2012-03-07 ENCOUNTER — Ambulatory Visit
Admission: RE | Admit: 2012-03-07 | Discharge: 2012-03-07 | Disposition: A | Discharge status: Home / Self Care | Payer: PRIVATE HEALTH INSURANCE | Source: Ambulatory Visit | Attending: Obstetrics & Gynecology | Admitting: Obstetrics & Gynecology

## 2012-03-07 DIAGNOSIS — Z1231 Encounter for screening mammogram for malignant neoplasm of breast: Secondary | ICD-10-CM

## 2012-07-14 ENCOUNTER — Other Ambulatory Visit: Payer: Self-pay | Admitting: Internal Medicine

## 2012-07-14 NOTE — Telephone Encounter (Signed)
Patient request refill om medication alprazolam. Dr. Debby Bud Patient. Last OV 07/20/2011. Please advise.

## 2012-07-18 ENCOUNTER — Telehealth: Payer: Self-pay | Admitting: *Deleted

## 2012-07-18 MED ORDER — ALPRAZOLAM 0.25 MG PO TABS: 0.2500 mg | ORAL_TABLET | Freq: Every evening | ORAL | Status: DC | PRN

## 2012-07-18 NOTE — Telephone Encounter (Signed)
Refill for alprazolam called to [facility] pharmacy.

## 2012-07-18 NOTE — Telephone Encounter (Signed)
OK for refill x 5. 

## 2012-07-18 NOTE — Telephone Encounter (Signed)
Patient request refill on alprazolam. Did not get response from Dr. Posey Rea on Friday. Please advise

## 2012-08-14 ENCOUNTER — Other Ambulatory Visit: Payer: Self-pay | Admitting: Internal Medicine

## 2012-10-10 ENCOUNTER — Other Ambulatory Visit: Payer: Self-pay | Admitting: Internal Medicine

## 2012-12-26 DIAGNOSIS — D171 Benign lipomatous neoplasm of skin and subcutaneous tissue of trunk: Secondary | ICD-10-CM | POA: Insufficient documentation

## 2013-01-17 ENCOUNTER — Other Ambulatory Visit: Payer: Self-pay | Admitting: Internal Medicine

## 2013-01-17 NOTE — Telephone Encounter (Signed)
Alprazolam called to pharmacy.   Lm for pt letting her know script was called in and she will need an office visit before further refills

## 2013-01-17 NOTE — Telephone Encounter (Signed)
Ok to refill 30d, no refill (per protocol covering for absent PCP) - prescription printed and signed -  Let pt know this is last rx until OV scheduled as last OV 07/2011

## 2013-01-30 ENCOUNTER — Other Ambulatory Visit: Payer: Self-pay

## 2013-01-30 DIAGNOSIS — Z1231 Encounter for screening mammogram for malignant neoplasm of breast: Secondary | ICD-10-CM

## 2013-03-11 ENCOUNTER — Other Ambulatory Visit: Payer: Self-pay | Admitting: Internal Medicine

## 2013-03-21 ENCOUNTER — Other Ambulatory Visit: Payer: Self-pay | Admitting: Internal Medicine

## 2013-03-21 NOTE — Telephone Encounter (Signed)
Alprazolam called to pharmacy  

## 2013-03-26 ENCOUNTER — Ambulatory Visit: Payer: PRIVATE HEALTH INSURANCE

## 2013-03-29 ENCOUNTER — Ambulatory Visit
Admission: RE | Admit: 2013-03-29 | Discharge: 2013-03-29 | Disposition: A | Discharge status: Home / Self Care | Payer: PRIVATE HEALTH INSURANCE | Source: Ambulatory Visit

## 2013-03-29 DIAGNOSIS — Z1231 Encounter for screening mammogram for malignant neoplasm of breast: Secondary | ICD-10-CM

## 2013-05-06 ENCOUNTER — Other Ambulatory Visit: Payer: Self-pay | Admitting: Internal Medicine

## 2013-06-25 ENCOUNTER — Other Ambulatory Visit: Payer: Self-pay | Admitting: Internal Medicine

## 2013-07-16 ENCOUNTER — Ambulatory Visit (INDEPENDENT_AMBULATORY_CARE_PROVIDER_SITE_OTHER): Payer: PRIVATE HEALTH INSURANCE | Admitting: Internal Medicine

## 2013-07-16 ENCOUNTER — Encounter: Payer: Self-pay | Admitting: Internal Medicine

## 2013-07-16 VITALS — BP 134/68 | HR 59 | Temp 97.4°F | Ht 64.5 in | Wt 132.0 lb

## 2013-07-16 DIAGNOSIS — Z0000 Encounter for general adult medical examination without abnormal findings: Secondary | ICD-10-CM

## 2013-07-16 DIAGNOSIS — G4700 Insomnia, unspecified: Secondary | ICD-10-CM

## 2013-07-16 DIAGNOSIS — I10 Essential (primary) hypertension: Secondary | ICD-10-CM

## 2013-07-16 MED ORDER — ZOLPIDEM TARTRATE 5 MG PO TABS: ORAL_TABLET | ORAL | Status: DC

## 2013-07-16 MED ORDER — METOPROLOL SUCCINATE ER 25 MG PO TB24: ORAL_TABLET | ORAL | Status: DC

## 2013-07-16 MED ORDER — DICLOFENAC SODIUM 75 MG PO TBEC: 75.0000 mg | DELAYED_RELEASE_TABLET | Freq: Two times a day (BID) | ORAL | Status: DC

## 2013-07-16 MED ORDER — HYDROCHLOROTHIAZIDE 25 MG PO TABS: ORAL_TABLET | ORAL | Status: DC

## 2013-07-16 MED ORDER — ALPRAZOLAM 0.25 MG PO TABS: ORAL_TABLET | ORAL | Status: DC

## 2013-07-16 NOTE — Progress Notes (Signed)
Subjective:    Patient ID: Sue [name], female    DOB: 28-Nov-1950, 62 y.o.   MRN: [id number]  HPI Mrs. Rakestraw presents for routine medical exam. Interval history is unremarkable except she had an episode of what sounds like muscle fasciculations of the gracilis/vastus medialis. She also had good results using voltaren for pain relief.  She is current with he gyn. She is current with colorectal cancer and breast cancer screening.   Past Medical History  Diagnosis Date  . FRACTURE, ARM, LEFT 11/15/2007  . FRACTURE, FOOT 11/15/2007  . FRACTURE, INDEX FINGER, LEFT 11/15/2007  . HYPERTENSION 11/15/2007  . INSOMNIA, CHRONIC 06/17/2009  . MITRAL VALVE PROLAPSE, HX OF 11/15/2007  . OSTEOARTHRITIS 11/15/2007  . RIB FRACTURE 11/15/2007  . Scarlet fever 11/15/2007  . TONSILLECTOMY, HX OF 11/15/2007   Past Surgical History  Procedure Laterality Date  . Tonsillectomy     Family History  Problem Relation Age of Onset  . Parkinsonism Other    History   Social History  . Marital Status: Divorced    Spouse Name: N/A    Number of Children: N/A  . Years of Education: N/A   Occupational History  . Not on file.   Social History Main Topics  . Smoking status: Never Smoker   . Smokeless tobacco: Not on file  . Alcohol Use: Yes     Comment: rare alcohol use  . Drug Use: No  . Sexual Activity: Not on file   Other Topics Concern  . Not on file   Social History Narrative   [place], [place]   Married '72-17 yrs then divorced; remarried '90 - 4 yrs divorced. 1 boy - '75; 1 daughter '83. Single partner since 2001. Socially active, riding horses not hunting but jumping little jumps. Lost her mother Spring '12 - doing OK with loss.    Current Outpatient Prescriptions on File Prior to Visit  Medication Sig Dispense Refill  . Chelated Potassium 99 MG TABS Take by mouth as needed.        . Desoximetasone (TOPICORT) 0.25 % ointment Apply bid for aphtous ulcer  15 g  2  . Esterified Estrogens  (MENEST) 0.3 MG tablet take 1/2 tablet my mouth every other day       . GLUCOSAMINE HCL PO Take by mouth daily.        . Melatonin 5 MG CAPS Take by mouth as needed.        . metoprolol succinate (TOPROL-XL) 25 MG 24 hr tablet TAKE 1 TABLET BY MOUTH EVERY DAY  30 tablet  0  . Multiple Vitamin (MULTIVITAMIN) capsule Take 1 capsule by mouth daily.         No current facility-administered medications on file prior to visit.      Review of Systems System review is negative for any constitutional, cardiac, pulmonary, GI or neuro symptoms or complaints other than as described in the HPI.     Objective:   Physical Exam Filed Vitals:   07/16/13 1341  BP: 134/68  Pulse: 59  Temp: 97.4 F (36.3 C)   Wt Readings from Last 3 Encounters:  07/16/13 132 lb (59.875 kg)  07/20/11 131 lb (59.421 kg)  06/17/09 130 lb (58.968 kg)   Gen'l: well nourished, well developed Woman in no distress HEENT - [place]/AT, EACs/TMs normal, oropharynx with native dentition in good condition, no buccal or palatal lesions, posterior pharynx clear, mucous membranes moist. C&S clear, PERRLA, fundi - normal Neck - supple, no  thyromegaly Nodes- negative submental, cervical, supraclavicular regions Chest - no deformity, no CVAT Lungs - clear without rales, wheezes. No increased work of breathing Breast - deferred Cardiovascular - regular rate and rhythm, quiet precordium, no murmurs, rubs or gallops, 2+ radial, DP and PT pulses Abdomen - BS+ x 4, no HSM, no guarding or rebound or tenderness Pelvic - deferred to gyn Rectal - deferred to gyn Extremities - no clubbing, cyanosis, edema or deformity.  Neuro - A&O x 3, CN II-XII normal, motor strength normal and equal, DTRs 2+ and symmetrical biceps, radial, and patellar tendons. Cerebellar - no tremor, no rigidity, fluid movement and normal gait. Derm - Head, neck, back, abdomen and extremities without suspicious lesions  Labs ordered and pending      Assessment & Plan:

## 2013-07-16 NOTE — Patient Instructions (Signed)
Good to see you and you look unchanged from the last visit.   Your exam is normal. We will get routine labs and the results will be posted to MyChart.  Your are current for gyn care and cancer screening. You have a current Tetanus. I understand you reluctance to have shingles vaccine but you may want to give this consideration as time goes on and there is normal immune senescence.   I will be referring you to Dr. Posey Rea as I retire.  Stay well, the best medicine.

## 2013-07-17 NOTE — Assessment & Plan Note (Signed)
BP Readings from Last 3 Encounters:  07/16/13 134/68  07/20/11 110/70  06/17/09 136/72   Very good control on low dose medication that is well tolerated  Plan Routine lab: Bmet  Rx refilled

## 2013-07-17 NOTE — Assessment & Plan Note (Signed)
Interval history is unremarkable for any serious illness, injury or surgery. Limited physical exam, sans breast and pelvic, is normal. She is current with her gynecologist, current with colorectal and breast cancer screening. Immunizations - offered Tdap and Zostavax - declined. Lab is ordered and pending: Cmet, Lipid, CBC.  In summary A very healthy and active woman who invests in her health. She is asked to return in 2 years or sooner as needed. She understands that she will be reassigned to another physician.

## 2013-07-17 NOTE — Assessment & Plan Note (Signed)
Stable sleep habit with occasional need for zolpidem.   Plan Rx- renewed

## 2013-07-19 ENCOUNTER — Other Ambulatory Visit: Payer: Self-pay

## 2013-07-19 ENCOUNTER — Other Ambulatory Visit (INDEPENDENT_AMBULATORY_CARE_PROVIDER_SITE_OTHER): Payer: PRIVATE HEALTH INSURANCE

## 2013-07-19 DIAGNOSIS — Z0000 Encounter for general adult medical examination without abnormal findings: Secondary | ICD-10-CM

## 2013-07-19 LAB — CBC WITH DIFFERENTIAL/PLATELET
Basophils Absolute: 0 10*3/uL (ref 0.0–0.1)
Eosinophils Absolute: 0.2 10*3/uL (ref 0.0–0.7)
HCT: 41.9 % (ref 36.0–46.0)
Hemoglobin: 14.3 g/dL (ref 12.0–15.0)
Lymphocytes Relative: 35.7 % (ref 12.0–46.0)
Lymphs Abs: 2.1 10*3/uL (ref 0.7–4.0)
MCHC: 34.1 g/dL (ref 30.0–36.0)
Monocytes Absolute: 0.5 10*3/uL (ref 0.1–1.0)
Neutro Abs: 3.1 10*3/uL (ref 1.4–7.7)
RDW: 14.1 % (ref 11.5–14.6)

## 2013-07-19 LAB — LIPID PANEL
Cholesterol: 209 mg/dL — ABNORMAL HIGH (ref 0–200)
HDL: 81.4 mg/dL (ref >39.00)
VLDL: 9 mg/dL (ref 0.0–40.0)

## 2013-07-19 LAB — COMPREHENSIVE METABOLIC PANEL
Albumin: 4.1 g/dL (ref 3.5–5.2)
CO2: 30 mEq/L (ref 19–32)
Creatinine, Ser: 0.7 mg/dL (ref 0.4–1.2)
GFR: 97.36 mL/min (ref >60.00)
Glucose, Bld: 92 mg/dL (ref 70–99)
Potassium: 3.8 mEq/L (ref 3.5–5.1)
Sodium: 138 mEq/L (ref 135–145)
Total Bilirubin: 0.5 mg/dL (ref 0.3–1.2)
Total Protein: 7 g/dL (ref 6.0–8.3)

## 2013-07-19 LAB — HEPATIC FUNCTION PANEL
AST: 21 U/L (ref 0–37)
Alkaline Phosphatase: 51 U/L (ref 39–117)
Total Bilirubin: 0.5 mg/dL (ref 0.3–1.2)

## 2013-09-21 ENCOUNTER — Other Ambulatory Visit: Payer: Self-pay | Admitting: Internal Medicine

## 2013-12-28 ENCOUNTER — Other Ambulatory Visit: Payer: Self-pay | Admitting: *Deleted

## 2013-12-28 MED ORDER — ALPRAZOLAM 0.25 MG PO TABS: ORAL_TABLET | ORAL | Status: DC

## 2013-12-28 NOTE — Telephone Encounter (Signed)
[facility],/lmb 

## 2013-12-31 ENCOUNTER — Other Ambulatory Visit: Payer: Self-pay | Admitting: Obstetrics & Gynecology

## 2013-12-31 DIAGNOSIS — N644 Mastodynia: Secondary | ICD-10-CM

## 2014-01-07 ENCOUNTER — Ambulatory Visit
Admission: RE | Admit: 2014-01-07 | Discharge: 2014-01-07 | Disposition: A | Discharge status: Home / Self Care | Payer: No Typology Code available for payment source | Source: Ambulatory Visit | Attending: Obstetrics & Gynecology | Admitting: Obstetrics & Gynecology

## 2014-01-07 DIAGNOSIS — N644 Mastodynia: Secondary | ICD-10-CM

## 2014-03-11 DIAGNOSIS — M6740 Ganglion, unspecified site: Secondary | ICD-10-CM | POA: Diagnosis not present

## 2014-03-29 ENCOUNTER — Telehealth: Payer: Self-pay | Admitting: *Deleted

## 2014-03-29 MED ORDER — ZOLPIDEM TARTRATE 5 MG PO TABS: ORAL_TABLET | ORAL | Status: DC

## 2014-03-29 NOTE — Telephone Encounter (Signed)
OK to fill 30 d prescription with additional refills x0 Sch OV w/a new PCP pls Thank you!

## 2014-03-29 NOTE — Telephone Encounter (Signed)
Faxed script bck to pharmacy...Sue [name]

## 2014-03-29 NOTE — Telephone Encounter (Signed)
Received fax pt requesting refills on her zolpidem. Last ov CPX 07/2013 w/Dr. Linda Hedges...Johny Chess

## 2014-04-04 DIAGNOSIS — M6740 Ganglion, unspecified site: Secondary | ICD-10-CM | POA: Diagnosis not present

## 2014-04-16 ENCOUNTER — Other Ambulatory Visit: Payer: Self-pay

## 2014-04-16 DIAGNOSIS — Z1231 Encounter for screening mammogram for malignant neoplasm of breast: Secondary | ICD-10-CM

## 2014-05-06 ENCOUNTER — Ambulatory Visit (INDEPENDENT_AMBULATORY_CARE_PROVIDER_SITE_OTHER): Payer: Medicare Other | Admitting: Podiatry

## 2014-05-06 ENCOUNTER — Ambulatory Visit (INDEPENDENT_AMBULATORY_CARE_PROVIDER_SITE_OTHER): Payer: Medicare Other

## 2014-05-06 ENCOUNTER — Encounter: Payer: Self-pay | Admitting: Podiatry

## 2014-05-06 VITALS — BP 124/74 | HR 52 | Resp 14 | Ht 64.5 in | Wt 129.0 lb

## 2014-05-06 DIAGNOSIS — M7750 Other enthesopathy of unspecified foot: Secondary | ICD-10-CM | POA: Diagnosis not present

## 2014-05-06 DIAGNOSIS — M898X9 Other specified disorders of bone, unspecified site: Secondary | ICD-10-CM | POA: Diagnosis not present

## 2014-05-06 DIAGNOSIS — M79672 Pain in left foot: Secondary | ICD-10-CM

## 2014-05-06 DIAGNOSIS — M79609 Pain in unspecified limb: Secondary | ICD-10-CM

## 2014-05-06 MED ORDER — TRIAMCINOLONE ACETONIDE 10 MG/ML IJ SUSP
10.0000 mg | Freq: Once | INTRAMUSCULAR | Status: AC
  Administered 2014-05-06: 10 mg

## 2014-05-06 NOTE — Progress Notes (Signed)
   Subjective:    Patient ID: Sue [name], female    DOB: 1951/07/17, 63 y.o.   MRN: [id number]  HPI Comments: Pt states she noticed the bone on the top of her left foot is more pronounced over 1 month.  Pt states her foot becomes painful in certain shoes.  Foot Pain      Review of Systems  All other systems reviewed and are negative.      Objective:   Physical Exam        Assessment & Plan:

## 2014-05-07 ENCOUNTER — Ambulatory Visit
Admission: RE | Admit: 2014-05-07 | Discharge: 2014-05-07 | Disposition: A | Discharge status: Home / Self Care | Payer: Medicare Other | Source: Ambulatory Visit

## 2014-05-07 DIAGNOSIS — Z1231 Encounter for screening mammogram for malignant neoplasm of breast: Secondary | ICD-10-CM | POA: Diagnosis not present

## 2014-05-07 NOTE — Progress Notes (Signed)
Subjective:     Patient ID: Sue [name], female   DOB: [birth year], 63 y.o.   MRN: [id number]  HPI patient presents with pain in the dorsum of the left foot and states that it's hard to wear certain shoes and that seems like the inflammation is gradually getting worse   Review of Systems     Objective:   Physical Exam Neurovascular status intact with muscle strength adequate and noted to have some prominence over the first metatarsocuneiform joint with fluid buildup and pain when pressed into this area    Assessment:    probable inflammatory tendinitis right mid foot with exostotic lesion that may be a part of the process along with low grade osteoarthritis    Plan:     H&P and x-ray reviewed and careful injection administered 3 mg Kenalog 5 mg Xylocaine and advised on open toed shoes for several days. If symptoms persist will reappoint and may need to consider spur resection

## 2014-06-06 DIAGNOSIS — H2510 Age-related nuclear cataract, unspecified eye: Secondary | ICD-10-CM | POA: Diagnosis not present

## 2014-06-11 ENCOUNTER — Telehealth: Payer: Self-pay | Admitting: *Deleted

## 2014-06-11 ENCOUNTER — Other Ambulatory Visit: Payer: Self-pay | Admitting: Internal Medicine

## 2014-06-11 MED ORDER — ALPRAZOLAM 0.25 MG PO TABS: ORAL_TABLET | ORAL | Status: DC

## 2014-06-11 NOTE — Telephone Encounter (Signed)
Will refill for #30 no refills.

## 2014-06-11 NOTE — Telephone Encounter (Signed)
Rf req for Alprazolam 0.25 mg 1 po qhs prn.[name] to Rf in PCP absence?

## 2014-07-11 ENCOUNTER — Other Ambulatory Visit: Payer: Self-pay | Admitting: Internal Medicine

## 2014-07-11 DIAGNOSIS — M2142 Flat foot [pes planus] (acquired), left foot: Secondary | ICD-10-CM | POA: Diagnosis not present

## 2014-07-11 DIAGNOSIS — M79672 Pain in left foot: Secondary | ICD-10-CM | POA: Diagnosis not present

## 2014-07-11 NOTE — Telephone Encounter (Signed)
Refill done. Pt has a future appt with dr. Alain Marion on 11.4.15

## 2014-07-17 ENCOUNTER — Ambulatory Visit (INDEPENDENT_AMBULATORY_CARE_PROVIDER_SITE_OTHER): Payer: Medicare Other | Admitting: Internal Medicine

## 2014-07-17 ENCOUNTER — Encounter: Payer: Self-pay | Admitting: Internal Medicine

## 2014-07-17 VITALS — BP 150/74 | HR 58 | Temp 98.1°F | Ht 64.5 in | Wt 133.0 lb

## 2014-07-17 DIAGNOSIS — Z0000 Encounter for general adult medical examination without abnormal findings: Secondary | ICD-10-CM | POA: Diagnosis not present

## 2014-07-17 DIAGNOSIS — E785 Hyperlipidemia, unspecified: Secondary | ICD-10-CM

## 2014-07-17 DIAGNOSIS — G4700 Insomnia, unspecified: Secondary | ICD-10-CM

## 2014-07-17 MED ORDER — METOPROLOL SUCCINATE ER 25 MG PO TB24: ORAL_TABLET | ORAL | Status: DC

## 2014-07-17 MED ORDER — ALPRAZOLAM 0.25 MG PO TABS: 0.2500 mg | ORAL_TABLET | Freq: Two times a day (BID) | ORAL | Status: DC | PRN

## 2014-07-17 MED ORDER — DICLOFENAC SODIUM 75 MG PO TBEC: 75.0000 mg | DELAYED_RELEASE_TABLET | Freq: Two times a day (BID) | ORAL | Status: DC

## 2014-07-17 MED ORDER — ZOLPIDEM TARTRATE 5 MG PO TABS: ORAL_TABLET | ORAL | Status: DC

## 2014-07-17 MED ORDER — HYDROCHLOROTHIAZIDE 25 MG PO TABS: ORAL_TABLET | ORAL | Status: DC

## 2014-07-17 NOTE — Progress Notes (Signed)
   Subjective:    Patient ID: Sue [name], female    DOB: [birth year], 63 y.o.   MRN: [id number]  HPI  The patient is here for a wellness exam. The patient has been doing well overall without major physical or psychological issues going on lately.  F/u HTN, insomnia. BP is ok at home.   Review of Systems  Constitutional: Negative for fever, chills, diaphoresis, activity change, appetite change, fatigue and unexpected weight change.  HENT: Negative for congestion, dental problem, ear pain, hearing loss, mouth sores, postnasal drip, sinus pressure, sneezing, sore throat and voice change.   Eyes: Negative for pain and visual disturbance.  Respiratory: Negative for cough, chest tightness, wheezing and stridor.   Cardiovascular: Negative for chest pain, palpitations and leg swelling.  Gastrointestinal: Negative for nausea, vomiting, abdominal pain, blood in stool, abdominal distention and rectal pain.  Genitourinary: Negative for dysuria, frequency, hematuria, decreased urine volume, vaginal bleeding, vaginal discharge, difficulty urinating, vaginal pain and menstrual problem.  Musculoskeletal: Positive for arthralgias. Negative for back pain, joint swelling, gait problem and neck pain.  Skin: Negative for color change, rash and wound.  Neurological: Negative for dizziness, tremors, syncope, speech difficulty, weakness and light-headedness.  Hematological: Negative for adenopathy.  Psychiatric/Behavioral: Negative for suicidal ideas, hallucinations, behavioral problems, confusion, sleep disturbance, dysphoric mood and decreased concentration. The patient is not nervous/anxious and is not hyperactive.        Objective:   Physical Exam  Constitutional: She appears well-developed. No distress.  HENT:  Head: Normocephalic.  Right Ear: External ear normal.  Left Ear: External ear normal.  Nose: Nose normal.  Mouth/Throat: Oropharynx is clear and moist.  Eyes: Conjunctivae are normal.  Pupils are equal, round, and reactive to light. Right eye exhibits no discharge. Left eye exhibits no discharge.  Neck: Normal range of motion. Neck supple. No JVD present. No tracheal deviation present. No thyromegaly present.  Cardiovascular: Normal rate, regular rhythm and normal heart sounds.   Pulmonary/Chest: No stridor. No respiratory distress. She has no wheezes.  Abdominal: Soft. Bowel sounds are normal. She exhibits no distension and no mass. There is no tenderness. There is no rebound and no guarding.  Musculoskeletal: She exhibits no edema or tenderness.  Lymphadenopathy:    She has no cervical adenopathy.  Neurological: She displays normal reflexes. No cranial nerve deficit. She exhibits normal muscle tone. Coordination normal.  Skin: No rash noted. No erythema.  Psychiatric: She has a normal mood and affect. Her behavior is normal. Judgment and thought content normal.    Lab Results  Component Value Date   WBC 6.0 07/19/2013   HGB 14.3 07/19/2013   HCT 41.9 07/19/2013   PLT 263.0 07/19/2013   GLUCOSE 92 07/19/2013   CHOL 209* 07/19/2013   TRIG 45.0 07/19/2013   HDL 81.40 07/19/2013   LDLDIRECT 118.2 07/19/2013   LDLCALC 120* 07/15/2011   ALT 24 07/19/2013   ALT 24 07/19/2013   AST 21 07/19/2013   AST 21 07/19/2013   NA 138 07/19/2013   K 3.8 07/19/2013   CL 101 07/19/2013   CREATININE 0.7 07/19/2013   BUN 17 07/19/2013   CO2 30 07/19/2013   TSH 2.33 07/15/2011         Assessment & Plan:

## 2014-07-17 NOTE — Assessment & Plan Note (Signed)
Continue with current prn prescription therapy as reflected on the Med list.  

## 2014-07-17 NOTE — Progress Notes (Signed)
Pre visit review using our clinic review tool, if applicable. No additional management support is needed unless otherwise documented below in the visit note. 

## 2014-07-17 NOTE — Assessment & Plan Note (Signed)
Here for medicare wellness/physical  Diet: heart healthy  Physical activity: not sedentary - tennis Depression/mood screen: negative  Hearing: intact to whispered voice  Visual acuity: grossly normal, performs annual eye exam  ADLs: capable  Fall risk: none  Home safety: good  Cognitive evaluation: intact to orientation, naming, recall and repetition  EOL planning: adv directives, full code/ I agree  I have personally reviewed and have noted  1. The patient's medical and social history  2. Their use of alcohol, tobacco or illicit drugs  3. Their current medications and supplements  4. The patient's functional ability including ADL's, fall risks, home safety risks and hearing or visual impairment.  5. Diet and physical activities  6. Evidence for depression or mood disorders    Today patient counseled on age appropriate routine health concerns for screening and prevention, each reviewed and up to date or declined. Immunizations reviewed and up to date or declined. Labs ordered and reviewed. Risk factors for depression reviewed and negative. Hearing function and visual acuity are intact. ADLs screened and addressed as needed. Functional ability and level of safety reviewed and appropriate. Education, counseling and referrals performed based on assessed risks today. Patient provided with a copy of personalized plan for preventive services.      

## 2014-07-18 ENCOUNTER — Encounter: Payer: Self-pay | Admitting: Internal Medicine

## 2014-07-23 ENCOUNTER — Other Ambulatory Visit (INDEPENDENT_AMBULATORY_CARE_PROVIDER_SITE_OTHER): Payer: Medicare Other

## 2014-07-23 DIAGNOSIS — I10 Essential (primary) hypertension: Secondary | ICD-10-CM | POA: Diagnosis not present

## 2014-07-23 DIAGNOSIS — Z0000 Encounter for general adult medical examination without abnormal findings: Secondary | ICD-10-CM | POA: Diagnosis not present

## 2014-07-23 DIAGNOSIS — G4700 Insomnia, unspecified: Secondary | ICD-10-CM

## 2014-07-23 DIAGNOSIS — E785 Hyperlipidemia, unspecified: Secondary | ICD-10-CM | POA: Diagnosis not present

## 2014-07-23 LAB — BASIC METABOLIC PANEL
BUN: 21 mg/dL (ref 6–23)
CALCIUM: 9.1 mg/dL (ref 8.4–10.5)
CO2: 25 mEq/L (ref 19–32)
Chloride: 105 mEq/L (ref 96–112)
Creatinine, Ser: 0.8 mg/dL (ref 0.4–1.2)
GFR: 82.28 mL/min (ref >60.00)
Glucose, Bld: 89 mg/dL (ref 70–99)
POTASSIUM: 4 meq/L (ref 3.5–5.1)
Sodium: 142 mEq/L (ref 135–145)

## 2014-07-23 LAB — LIPID PANEL
Cholesterol: 202 mg/dL — ABNORMAL HIGH (ref 0–200)
HDL: 65.5 mg/dL
LDL Cholesterol: 130 mg/dL — ABNORMAL HIGH (ref 0–99)
NonHDL: 136.5
Total CHOL/HDL Ratio: 3
Triglycerides: 34 mg/dL (ref 0.0–149.0)
VLDL: 6.8 mg/dL (ref 0.0–40.0)

## 2014-07-23 LAB — CBC WITH DIFFERENTIAL/PLATELET
BASOS ABS: 0 10*3/uL (ref 0.0–0.1)
Basophils Relative: 0.7 % (ref 0.0–3.0)
Eosinophils Absolute: 0.3 10*3/uL (ref 0.0–0.7)
Eosinophils Relative: 4.5 % (ref 0.0–5.0)
HEMATOCRIT: 41.2 % (ref 36.0–46.0)
Hemoglobin: 13.5 g/dL (ref 12.0–15.0)
LYMPHS ABS: 2.2 10*3/uL (ref 0.7–4.0)
Lymphocytes Relative: 38 % (ref 12.0–46.0)
MCHC: 32.9 g/dL (ref 30.0–36.0)
MCV: 91.4 fl (ref 78.0–100.0)
Monocytes Absolute: 0.6 10*3/uL (ref 0.1–1.0)
Monocytes Relative: 11.3 % (ref 3.0–12.0)
Neutro Abs: 2.6 10*3/uL (ref 1.4–7.7)
Neutrophils Relative %: 45.5 % (ref 43.0–77.0)
PLATELETS: 271 10*3/uL (ref 150.0–400.0)
RBC: 4.5 Mil/uL (ref 3.87–5.11)
RDW: 14.1 % (ref 11.5–15.5)
WBC: 5.7 10*3/uL (ref 4.0–10.5)

## 2014-07-23 LAB — URINALYSIS, ROUTINE W REFLEX MICROSCOPIC
Bilirubin Urine: NEGATIVE
Hgb urine dipstick: NEGATIVE
Ketones, ur: NEGATIVE
Nitrite: NEGATIVE
PH: 7 (ref 5.0–8.0)
SPECIFIC GRAVITY, URINE: 1.02 (ref 1.000–1.030)
Total Protein, Urine: NEGATIVE
Urine Glucose: NEGATIVE
Urobilinogen, UA: 0.2 (ref 0.0–1.0)

## 2014-07-23 LAB — HEPATIC FUNCTION PANEL
ALK PHOS: 51 U/L (ref 39–117)
ALT: 25 U/L (ref 0–35)
AST: 28 U/L (ref 0–37)
Albumin: 3.4 g/dL — ABNORMAL LOW (ref 3.5–5.2)
BILIRUBIN DIRECT: 0 mg/dL (ref 0.0–0.3)
BILIRUBIN TOTAL: 0.5 mg/dL (ref 0.2–1.2)
Total Protein: 6.7 g/dL (ref 6.0–8.3)

## 2014-07-23 LAB — TSH: TSH: 2.51 u[IU]/mL (ref 0.35–4.50)

## 2014-07-25 ENCOUNTER — Encounter: Payer: Self-pay | Admitting: Sports Medicine

## 2014-07-25 ENCOUNTER — Ambulatory Visit (INDEPENDENT_AMBULATORY_CARE_PROVIDER_SITE_OTHER): Payer: Medicare Other | Admitting: Sports Medicine

## 2014-07-25 VITALS — BP 134/66 | Ht 64.5 in | Wt 128.0 lb

## 2014-07-25 DIAGNOSIS — M19072 Primary osteoarthritis, left ankle and foot: Secondary | ICD-10-CM | POA: Insufficient documentation

## 2014-07-25 DIAGNOSIS — R269 Unspecified abnormalities of gait and mobility: Secondary | ICD-10-CM | POA: Insufficient documentation

## 2014-07-25 NOTE — Progress Notes (Signed)
  Sue [name] - 63 y.o. female MRN [id number]  Date of birth: 08-09-1951  SUBJECTIVE:  Including CC & ROS.  Patient is 63 year old Caucasian female is presenting today for gait analysis and consideration for orthotics for her left mid foot pain. Patient reports having midfoot pain on her left dorsum of her foot with both the protrusion in this area causing friction and discomfort with most shoes. Patient reports that this has been a discomfort for her for approximately 6 months. She was previously seen at a foot clinic in the [place] area and received a injection into her midfoot that she did not find to be beneficial. She was seen by Dr. Noemi Chapel at [facility] orthopedic group in the last 2 weeks he personally reviewed her x-rays and agreed that she had some osteoarthritis per recommended orthotics and additional padding to help support her arch. Patient reports that her pain is on the dorsum of her foot when this bony protuberance presses up against her shoes particularly worse with walking and horseback riding. She has tried some doughnut pads on the dorsum of the foot which has helped some in distributing her force when walking and riding. She has not tried any insoles. Has not tried any topical anti-inflammatories.   ROS: Review of systems otherwise negative except for information present in HPI  HISTORY: Past Medical, Surgical, Social, and Family History Reviewed & Updated per EMR. Pertinent Historical Findings include: Mitral valve prolapse Essential hypertension  DATA REVIEWED: Personally reviewed patient's recent x-rays completed in September 2015 Lateral view of her foot shows mid foot bone spurring and osteoarthritis between the proximal first metatarsal and first cuneiform. This is most consistent with patient's bony protuberance  PHYSICAL EXAM:  VS: BP:134/66 mmHg  HR: bpm  TEMP: ( )  RESP:   HT:5' 4.5" (163.8 cm)   WT:128 lb (58.06 kg)  BMI:21.7 FOOT EXAM:  General:  well nourished Skin of LE: warm; dry, no rashes, lesions, ecchymosis or erythema. Vascular: Dorsal pedal pulses 2+ bilaterally Neurologically: Sensation to light touch lower extremities equal and intact bilaterally.  Observation - no ecchymosis, no edema, or hematoma present  Palpation: TTP over the 1st Metatarsal - cuneiform joint with a more promote protrusion from OA and bone spurring. Normal ankle motion and strength bilaterally  Extension/flexion 5/5 strength bilaterally in toes Weight-bearing foot exam:  First ray: neurtral  Second ray: slightly longer than the first Longitudinal arch: pes cavus Heel position: valgus stress Gait analysis:  Striking location: midfoot Foot motion: pronation Knee motion: neutral  Hip motion: neutral   ASSESSMENT & PLAN: See problem based charting & AVS for pt instructions.

## 2014-07-25 NOTE — Assessment & Plan Note (Signed)
Based on the patient's history of a bony prominence and pain localized to the first metatarsal first cuneiform joint, with evidence of bone spurring and osteoarthritis on her x-ray. I suspect additional support for her arch will prevent any collapse of this arch with her gait as well as additional padding to the bony protuberance.  Recommendations: -Patient was placed in green insoles without scaphoid pad (too much padding) to help confirm that additional arch support will benefit her before proceeding with custom orthotics. -Provided patient with additional arch support with a arch support strap and placed a doughnut pad over her bony protuberance to distribute force. -Advised patient to make a 1-2 week follow-up after making the subtle changes to determine if this provides her any more support or pain control. If this is supportive we'll proceed with making custom orthotics at that time to neutralize her foot and provide arch support

## 2014-08-12 DIAGNOSIS — Z01419 Encounter for gynecological examination (general) (routine) without abnormal findings: Secondary | ICD-10-CM | POA: Diagnosis not present

## 2014-08-12 DIAGNOSIS — B977 Papillomavirus as the cause of diseases classified elsewhere: Secondary | ICD-10-CM | POA: Diagnosis not present

## 2014-08-12 DIAGNOSIS — Z124 Encounter for screening for malignant neoplasm of cervix: Secondary | ICD-10-CM | POA: Diagnosis not present

## 2014-08-16 DIAGNOSIS — L812 Freckles: Secondary | ICD-10-CM | POA: Diagnosis not present

## 2014-08-16 DIAGNOSIS — D1801 Hemangioma of skin and subcutaneous tissue: Secondary | ICD-10-CM | POA: Diagnosis not present

## 2014-08-16 DIAGNOSIS — L820 Inflamed seborrheic keratosis: Secondary | ICD-10-CM | POA: Diagnosis not present

## 2014-08-16 DIAGNOSIS — L821 Other seborrheic keratosis: Secondary | ICD-10-CM | POA: Diagnosis not present

## 2014-09-18 DIAGNOSIS — H04123 Dry eye syndrome of bilateral lacrimal glands: Secondary | ICD-10-CM | POA: Diagnosis not present

## 2014-09-18 DIAGNOSIS — H52223 Regular astigmatism, bilateral: Secondary | ICD-10-CM | POA: Diagnosis not present

## 2014-09-18 DIAGNOSIS — H5203 Hypermetropia, bilateral: Secondary | ICD-10-CM | POA: Diagnosis not present

## 2014-09-18 DIAGNOSIS — H524 Presbyopia: Secondary | ICD-10-CM | POA: Diagnosis not present

## 2014-09-18 DIAGNOSIS — H25813 Combined forms of age-related cataract, bilateral: Secondary | ICD-10-CM | POA: Diagnosis not present

## 2014-12-03 DIAGNOSIS — D225 Melanocytic nevi of trunk: Secondary | ICD-10-CM | POA: Diagnosis not present

## 2014-12-03 DIAGNOSIS — D1721 Benign lipomatous neoplasm of skin and subcutaneous tissue of right arm: Secondary | ICD-10-CM | POA: Diagnosis not present

## 2014-12-03 DIAGNOSIS — D171 Benign lipomatous neoplasm of skin and subcutaneous tissue of trunk: Secondary | ICD-10-CM | POA: Diagnosis not present

## 2014-12-05 DIAGNOSIS — H524 Presbyopia: Secondary | ICD-10-CM | POA: Diagnosis not present

## 2014-12-05 DIAGNOSIS — H2513 Age-related nuclear cataract, bilateral: Secondary | ICD-10-CM | POA: Diagnosis not present

## 2014-12-05 DIAGNOSIS — H04123 Dry eye syndrome of bilateral lacrimal glands: Secondary | ICD-10-CM | POA: Diagnosis not present

## 2014-12-31 DIAGNOSIS — L309 Dermatitis, unspecified: Secondary | ICD-10-CM | POA: Diagnosis not present

## 2015-01-12 HISTORY — PX: CATARACT EXTRACTION W/ INTRAOCULAR LENS IMPLANT: SHX1309

## 2015-01-14 ENCOUNTER — Telehealth: Payer: Self-pay | Admitting: Internal Medicine

## 2015-01-14 ENCOUNTER — Other Ambulatory Visit: Payer: Self-pay | Admitting: Internal Medicine

## 2015-01-14 MED ORDER — HYDROCHLOROTHIAZIDE 25 MG PO TABS: ORAL_TABLET | ORAL | Status: DC

## 2015-01-14 NOTE — Telephone Encounter (Signed)
Patient is requesting a refill on hydrochlorothiazide (HYDRODIURIL) 25 MG tablet [[id number]] with a quantity of 100. She pays this straight out of pocket. Pharmacy is [facility] [address]

## 2015-01-14 NOTE — Telephone Encounter (Signed)
Left message that rx has been called in

## 2015-01-14 NOTE — Telephone Encounter (Signed)
Rf sent. See meds.  

## 2015-01-15 DIAGNOSIS — H2512 Age-related nuclear cataract, left eye: Secondary | ICD-10-CM | POA: Diagnosis not present

## 2015-01-27 DIAGNOSIS — H25812 Combined forms of age-related cataract, left eye: Secondary | ICD-10-CM | POA: Diagnosis not present

## 2015-01-27 DIAGNOSIS — H2512 Age-related nuclear cataract, left eye: Secondary | ICD-10-CM | POA: Diagnosis not present

## 2015-02-06 DIAGNOSIS — L812 Freckles: Secondary | ICD-10-CM | POA: Diagnosis not present

## 2015-02-06 DIAGNOSIS — D1801 Hemangioma of skin and subcutaneous tissue: Secondary | ICD-10-CM | POA: Diagnosis not present

## 2015-02-06 DIAGNOSIS — L570 Actinic keratosis: Secondary | ICD-10-CM | POA: Diagnosis not present

## 2015-02-06 DIAGNOSIS — L821 Other seborrheic keratosis: Secondary | ICD-10-CM | POA: Diagnosis not present

## 2015-03-04 ENCOUNTER — Encounter: Payer: Self-pay | Admitting: Internal Medicine

## 2015-03-04 ENCOUNTER — Encounter: Payer: Self-pay | Admitting: Gastroenterology

## 2015-03-09 ENCOUNTER — Other Ambulatory Visit: Payer: Self-pay | Admitting: Internal Medicine

## 2015-03-11 NOTE — Telephone Encounter (Signed)
Please advise, thanks.

## 2015-03-13 ENCOUNTER — Other Ambulatory Visit: Payer: Self-pay

## 2015-03-13 MED ORDER — ZOLPIDEM TARTRATE 5 MG PO TABS: 5.0000 mg | ORAL_TABLET | Freq: Every evening | ORAL | Status: DC | PRN

## 2015-03-13 MED ORDER — ALPRAZOLAM 0.25 MG PO TABS: ORAL_TABLET | ORAL | Status: DC

## 2015-03-13 NOTE — Telephone Encounter (Signed)
Xanax and ambien rx sent to [facility]

## 2015-03-14 DIAGNOSIS — S065XAA Traumatic subdural hemorrhage with loss of consciousness status unknown, initial encounter: Secondary | ICD-10-CM

## 2015-03-14 DIAGNOSIS — S065X9A Traumatic subdural hemorrhage with loss of consciousness of unspecified duration, initial encounter: Secondary | ICD-10-CM

## 2015-03-14 HISTORY — DX: Traumatic subdural hemorrhage with loss of consciousness of unspecified duration, initial encounter: S06.5X9A

## 2015-03-14 HISTORY — DX: Traumatic subdural hemorrhage with loss of consciousness status unknown, initial encounter: S06.5XAA

## 2015-03-17 ENCOUNTER — Other Ambulatory Visit: Payer: Self-pay | Admitting: Internal Medicine

## 2015-03-20 NOTE — Telephone Encounter (Signed)
Script for Ambien and Xanax called in

## 2015-03-26 DIAGNOSIS — M9903 Segmental and somatic dysfunction of lumbar region: Secondary | ICD-10-CM | POA: Diagnosis not present

## 2015-03-26 DIAGNOSIS — M9904 Segmental and somatic dysfunction of sacral region: Secondary | ICD-10-CM | POA: Diagnosis not present

## 2015-03-26 DIAGNOSIS — S344XXA Injury of lumbosacral plexus, initial encounter: Secondary | ICD-10-CM | POA: Diagnosis not present

## 2015-03-26 DIAGNOSIS — M5116 Intervertebral disc disorders with radiculopathy, lumbar region: Secondary | ICD-10-CM | POA: Diagnosis not present

## 2015-03-27 DIAGNOSIS — M9903 Segmental and somatic dysfunction of lumbar region: Secondary | ICD-10-CM | POA: Diagnosis not present

## 2015-03-27 DIAGNOSIS — S344XXA Injury of lumbosacral plexus, initial encounter: Secondary | ICD-10-CM | POA: Diagnosis not present

## 2015-03-27 DIAGNOSIS — M5116 Intervertebral disc disorders with radiculopathy, lumbar region: Secondary | ICD-10-CM | POA: Diagnosis not present

## 2015-03-27 DIAGNOSIS — M9904 Segmental and somatic dysfunction of sacral region: Secondary | ICD-10-CM | POA: Diagnosis not present

## 2015-03-31 DIAGNOSIS — M9903 Segmental and somatic dysfunction of lumbar region: Secondary | ICD-10-CM | POA: Diagnosis not present

## 2015-03-31 DIAGNOSIS — M5116 Intervertebral disc disorders with radiculopathy, lumbar region: Secondary | ICD-10-CM | POA: Diagnosis not present

## 2015-03-31 DIAGNOSIS — M9904 Segmental and somatic dysfunction of sacral region: Secondary | ICD-10-CM | POA: Diagnosis not present

## 2015-03-31 DIAGNOSIS — S344XXA Injury of lumbosacral plexus, initial encounter: Secondary | ICD-10-CM | POA: Diagnosis not present

## 2015-04-02 DIAGNOSIS — M9904 Segmental and somatic dysfunction of sacral region: Secondary | ICD-10-CM | POA: Diagnosis not present

## 2015-04-02 DIAGNOSIS — M9903 Segmental and somatic dysfunction of lumbar region: Secondary | ICD-10-CM | POA: Diagnosis not present

## 2015-04-02 DIAGNOSIS — M5116 Intervertebral disc disorders with radiculopathy, lumbar region: Secondary | ICD-10-CM | POA: Diagnosis not present

## 2015-04-02 DIAGNOSIS — S344XXA Injury of lumbosacral plexus, initial encounter: Secondary | ICD-10-CM | POA: Diagnosis not present

## 2015-04-07 DIAGNOSIS — M9904 Segmental and somatic dysfunction of sacral region: Secondary | ICD-10-CM | POA: Diagnosis not present

## 2015-04-07 DIAGNOSIS — M5116 Intervertebral disc disorders with radiculopathy, lumbar region: Secondary | ICD-10-CM | POA: Diagnosis not present

## 2015-04-07 DIAGNOSIS — M9903 Segmental and somatic dysfunction of lumbar region: Secondary | ICD-10-CM | POA: Diagnosis not present

## 2015-04-07 DIAGNOSIS — S344XXA Injury of lumbosacral plexus, initial encounter: Secondary | ICD-10-CM | POA: Diagnosis not present

## 2015-04-21 DIAGNOSIS — B373 Candidiasis of vulva and vagina: Secondary | ICD-10-CM | POA: Diagnosis not present

## 2015-04-21 DIAGNOSIS — L298 Other pruritus: Secondary | ICD-10-CM | POA: Diagnosis not present

## 2015-05-05 ENCOUNTER — Other Ambulatory Visit: Payer: Self-pay

## 2015-05-05 DIAGNOSIS — Z1231 Encounter for screening mammogram for malignant neoplasm of breast: Secondary | ICD-10-CM

## 2015-05-08 ENCOUNTER — Telehealth: Payer: Self-pay | Admitting: Internal Medicine

## 2015-05-08 DIAGNOSIS — R200 Anesthesia of skin: Secondary | ICD-10-CM | POA: Diagnosis not present

## 2015-05-08 DIAGNOSIS — I6203 Nontraumatic chronic subdural hemorrhage: Secondary | ICD-10-CM | POA: Diagnosis not present

## 2015-05-08 DIAGNOSIS — I10 Essential (primary) hypertension: Secondary | ICD-10-CM | POA: Diagnosis not present

## 2015-05-08 DIAGNOSIS — R2990 Unspecified symptoms and signs involving the nervous system: Secondary | ICD-10-CM | POA: Diagnosis not present

## 2015-05-08 DIAGNOSIS — M5030 Other cervical disc degeneration, unspecified cervical region: Secondary | ICD-10-CM | POA: Diagnosis not present

## 2015-05-08 DIAGNOSIS — Z880 Allergy status to penicillin: Secondary | ICD-10-CM | POA: Diagnosis not present

## 2015-05-08 NOTE — Telephone Encounter (Signed)
[facility]  Patient Name: Sue [name]  DOB: 06/30/1951    Initial Comment Caller States she has tingling, numbness in right hand that happens about 3x day. she had speech difficulty about 2 days ago, knew what she wanted to say and recognized that she was not saying it right at the time, lasted about a min. fell off a horse and landed on her back in july. after that she had a dull pain in the back of her head, but seems to have cleared up.    Nurse Assessment  Nurse: Justine Null, RN, Rodena Piety Date/Time Eilene Ghazi Time): 05/08/2015 3:41:31 PM  Confirm and document reason for call. If symptomatic, describe symptoms. ---Caller States she has tingling, numbness in right hand that happens about 3x day. she had speech difficulty about 2 days ago, knew what she wanted to say and recognized that she was not saying it right at the time, lasted about a min. fell off a horse and landed on her back in July. after that she had a dull pain in the back of her head, but seems to have cleared up. caller state that she has had no headache at present and has been having no numbness at present and has had once today when she was holding a pitcher  Has the patient traveled out of the country within the last 30 days? ---No  Does the patient require triage? ---Yes  Related visit to physician within the last 2 weeks? ---No  Does the PT have any chronic conditions? (i.e. diabetes, asthma, etc.) ---Yes  List chronic conditions. ---HTN     Guidelines    Guideline Title Affirmed Question Affirmed Notes  Neurologic Deficit [1] Numbness (i.e., loss of sensation) of the face, arm / hand, or leg / foot on one side of the body AND [2] sudden onset AND [3] brief (now gone)    Final Disposition User   Go to ED Now (or PCP triage) Justine Null, RN, Rodena Piety    Referrals  [facility] UNDECIDED   Disagree/Comply: Leta Baptist

## 2015-05-09 DIAGNOSIS — I6200 Nontraumatic subdural hemorrhage, unspecified: Secondary | ICD-10-CM | POA: Diagnosis not present

## 2015-05-16 ENCOUNTER — Other Ambulatory Visit: Payer: Self-pay | Admitting: *Deleted

## 2015-05-16 ENCOUNTER — Other Ambulatory Visit: Payer: Self-pay | Admitting: Neurological Surgery

## 2015-05-16 DIAGNOSIS — S065X9A Traumatic subdural hemorrhage with loss of consciousness of unspecified duration, initial encounter: Secondary | ICD-10-CM

## 2015-05-16 DIAGNOSIS — S065XAA Traumatic subdural hemorrhage with loss of consciousness status unknown, initial encounter: Secondary | ICD-10-CM

## 2015-05-16 MED ORDER — DICLOFENAC SODIUM 75 MG PO TBEC: 75.0000 mg | DELAYED_RELEASE_TABLET | Freq: Two times a day (BID) | ORAL | Status: DC

## 2015-05-22 ENCOUNTER — Ambulatory Visit (INDEPENDENT_AMBULATORY_CARE_PROVIDER_SITE_OTHER): Payer: Medicare Other | Admitting: Internal Medicine

## 2015-05-22 ENCOUNTER — Encounter: Payer: Self-pay | Admitting: Internal Medicine

## 2015-05-22 VITALS — BP 145/85 | HR 62 | Wt 131.0 lb

## 2015-05-22 DIAGNOSIS — S065X0A Traumatic subdural hemorrhage without loss of consciousness, initial encounter: Secondary | ICD-10-CM | POA: Insufficient documentation

## 2015-05-22 DIAGNOSIS — S065X0D Traumatic subdural hemorrhage without loss of consciousness, subsequent encounter: Secondary | ICD-10-CM

## 2015-05-22 MED ORDER — PHOSPHATIDYLSERINE-DHA-EPA 100-19.5-6.5 MG PO CAPS: 1.0000 | ORAL_CAPSULE | Freq: Every day | ORAL | Status: DC

## 2015-05-22 NOTE — Assessment & Plan Note (Signed)
04/17/15 Dr Ronnald Ramp - f/up pending Vayacog Rx

## 2015-05-22 NOTE — Progress Notes (Signed)
Subjective:  Patient ID: Sue [name], female    DOB: 03-21-1951  Age: 64 y.o. MRN: [id number]  CC: No chief complaint on file.   HPI Sue [name] presents for a subdural hematoma (fell off her horse on Jul 4th). There was no direct head injury or LOC. It was dx'd 3 wks later after she developed R hand weakness. Pt went to ER on 8/25 and  Pt saw Dr Ronnald Ramp (NS). BP 130/70s at home.  Outpatient Prescriptions Prior to Visit  Medication Sig Dispense Refill  . ALPRAZolam (XANAX) 0.25 MG tablet TAKE 1 TABLET BY MOUTH TWICE A DAY AS NEEDED FOR ANXIETY 60 tablet 2  . Chelated Potassium 99 MG TABS Take by mouth as needed.      . clotrimazole (MYCELEX) 10 MG troche   1  . Desoximetasone (TOPICORT) 0.25 % ointment Apply bid for aphtous ulcer 15 g 2  . diclofenac (VOLTAREN) 75 MG EC tablet Take 1 tablet (75 mg total) by mouth 2 (two) times daily. 60 tablet 0  . erythromycin base (E-MYCIN) 500 MG tablet   1  . Esterified Estrogens (MENEST) 0.3 MG tablet take 1/2 tablet my mouth every other day     . GLUCOSAMINE HCL PO Take by mouth daily.      . hydrochlorothiazide (HYDRODIURIL) 25 MG tablet TAKE 1/2 TABLET BY MOUTH DAILY 100 tablet 1  . lidocaine-hydrocortisone (ANAMANTEL HC) 3-0.5 % CREA   0  . Melatonin 5 MG CAPS Take by mouth as needed.      . metoprolol succinate (TOPROL-XL) 25 MG 24 hr tablet TAKE 1 TABLET BY MOUTH EVERY DAY 90 tablet 1  . Multiple Vitamin (MULTIVITAMIN) capsule Take 1 capsule by mouth daily.      . RESTASIS 0.05 % ophthalmic emulsion   3  . zolpidem (AMBIEN) 5 MG tablet TAKE 1 TABLET BY MOUTH AT BEDTIME AS NEEDED FOR SLEEP 90 tablet 0   No facility-administered medications prior to visit.    ROS Review of Systems  Constitutional: Negative for chills, activity change, appetite change, fatigue and unexpected weight change.  HENT: Negative for congestion, mouth sores and sinus pressure.   Eyes: Negative for visual disturbance.  Respiratory: Negative for cough  and chest tightness.   Gastrointestinal: Negative for nausea and abdominal pain.  Genitourinary: Negative for frequency, difficulty urinating and vaginal pain.  Musculoskeletal: Negative for back pain and gait problem.  Skin: Negative for pallor and rash.  Neurological: Positive for weakness and numbness. Negative for dizziness, tremors, syncope and headaches.       R hand  Psychiatric/Behavioral: Negative for suicidal ideas, confusion and sleep disturbance. The patient is not nervous/anxious.     Objective:  BP 176/100 mmHg  Pulse 62  Wt 131 lb (59.421 kg)  SpO2 97%  BP Readings from Last 3 Encounters:  05/22/15 176/100  07/25/14 134/66  07/17/14 150/74    Wt Readings from Last 3 Encounters:  05/22/15 131 lb (59.421 kg)  07/25/14 128 lb (58.06 kg)  07/17/14 133 lb (60.328 kg)    Physical Exam  Constitutional: She appears well-developed. No distress.  HENT:  Head: Normocephalic.  Right Ear: External ear normal.  Left Ear: External ear normal.  Nose: Nose normal.  Mouth/Throat: Oropharynx is clear and moist.  Eyes: Conjunctivae are normal. Pupils are equal, round, and reactive to light. Right eye exhibits no discharge. Left eye exhibits no discharge.  Neck: Normal range of motion. Neck supple. No JVD present. No tracheal deviation present.  No thyromegaly present.  Cardiovascular: Normal rate, regular rhythm and normal heart sounds.   Pulmonary/Chest: No stridor. No respiratory distress. She has no wheezes.  Abdominal: Soft. Bowel sounds are normal. She exhibits no distension and no mass. There is no tenderness. There is no rebound and no guarding.  Musculoskeletal: She exhibits no edema or tenderness.  Lymphadenopathy:    She has no cervical adenopathy.  Neurological: She displays normal reflexes. No cranial nerve deficit. She exhibits normal muscle tone. Coordination normal.  Skin: No rash noted. No erythema.  Psychiatric: She has a normal mood and affect. Her behavior  is normal. Judgment and thought content normal.    Lab Results  Component Value Date   WBC 5.7 07/23/2014   HGB 13.5 07/23/2014   HCT 41.2 07/23/2014   PLT 271.0 07/23/2014   GLUCOSE 89 07/23/2014   CHOL 202* 07/23/2014   TRIG 34.0 07/23/2014   HDL 65.50 07/23/2014   LDLDIRECT 118.2 07/19/2013   LDLCALC 130* 07/23/2014   ALT 25 07/23/2014   AST 28 07/23/2014   NA 142 07/23/2014   K 4.0 07/23/2014   CL 105 07/23/2014   CREATININE 0.8 07/23/2014   BUN 21 07/23/2014   CO2 25 07/23/2014   TSH 2.51 07/23/2014    Mm Screening Breast Tomo Bilateral  05/09/2014   CLINICAL DATA:  Screening. Family history of breast cancer in her mother at age 54.  EXAM: DIGITAL SCREENING BILATERAL MAMMOGRAM WITH 3D TOMO WITH CAD  COMPARISON:  Previous exam(s).  ACR Breast Density Category c: The breast tissue is heterogeneously dense, which may obscure small masses.  FINDINGS: There are no findings suspicious for malignancy. Images were processed with CAD.  IMPRESSION: No mammographic evidence of malignancy. A result letter of this screening mammogram will be mailed directly to the patient.  RECOMMENDATION: Screening mammogram in one year. (Code:SM-B-01Y)  BI-RADS CATEGORY  1: Negative.   Electronically Signed   By: Willadean Carol M.D.   On: 05/09/2014 19:47    Assessment & Plan:   There are no diagnoses linked to this encounter. I am having Ms. Cajamarca maintain her Chelated Potassium, GLUCOSAMINE HCL PO, Melatonin, Esterified Estrogens, multivitamin, Desoximetasone, erythromycin base, lidocaine-hydrocortisone, RESTASIS, clotrimazole, metoprolol succinate, hydrochlorothiazide, ALPRAZolam, zolpidem, diclofenac, and ESTRACE VAGINAL.  Meds ordered this encounter  Medications  . ESTRACE VAGINAL 0.1 MG/GM vaginal cream    Sig:      Follow-up: No Follow-up on file.  Walker Kehr, MD

## 2015-05-22 NOTE — Addendum Note (Signed)
Addended by: Cassandria Anger on: 05/22/2015 11:32 AM   Modules accepted: Level of Service

## 2015-05-22 NOTE — Progress Notes (Signed)
Pre visit review using our clinic review tool, if applicable. No additional management support is needed unless otherwise documented below in the visit note. 

## 2015-06-03 ENCOUNTER — Ambulatory Visit
Admission: RE | Admit: 2015-06-03 | Discharge: 2015-06-03 | Disposition: A | Discharge status: Home / Self Care | Payer: Medicare Other | Source: Ambulatory Visit | Attending: Neurological Surgery | Admitting: Neurological Surgery

## 2015-06-03 DIAGNOSIS — S065XAA Traumatic subdural hemorrhage with loss of consciousness status unknown, initial encounter: Secondary | ICD-10-CM

## 2015-06-03 DIAGNOSIS — I6200 Nontraumatic subdural hemorrhage, unspecified: Secondary | ICD-10-CM | POA: Diagnosis not present

## 2015-06-03 DIAGNOSIS — S065X9A Traumatic subdural hemorrhage with loss of consciousness of unspecified duration, initial encounter: Secondary | ICD-10-CM

## 2015-06-10 DIAGNOSIS — M9903 Segmental and somatic dysfunction of lumbar region: Secondary | ICD-10-CM | POA: Diagnosis not present

## 2015-06-10 DIAGNOSIS — M791 Myalgia: Secondary | ICD-10-CM | POA: Diagnosis not present

## 2015-06-10 DIAGNOSIS — M9901 Segmental and somatic dysfunction of cervical region: Secondary | ICD-10-CM | POA: Diagnosis not present

## 2015-06-10 DIAGNOSIS — M5413 Radiculopathy, cervicothoracic region: Secondary | ICD-10-CM | POA: Diagnosis not present

## 2015-06-12 ENCOUNTER — Ambulatory Visit
Admission: RE | Admit: 2015-06-12 | Discharge: 2015-06-12 | Disposition: A | Discharge status: Home / Self Care | Payer: Medicare Other | Source: Ambulatory Visit

## 2015-06-12 DIAGNOSIS — Z1231 Encounter for screening mammogram for malignant neoplasm of breast: Secondary | ICD-10-CM | POA: Diagnosis not present

## 2015-07-01 DIAGNOSIS — M791 Myalgia: Secondary | ICD-10-CM | POA: Diagnosis not present

## 2015-07-01 DIAGNOSIS — M9901 Segmental and somatic dysfunction of cervical region: Secondary | ICD-10-CM | POA: Diagnosis not present

## 2015-07-01 DIAGNOSIS — M9903 Segmental and somatic dysfunction of lumbar region: Secondary | ICD-10-CM | POA: Diagnosis not present

## 2015-07-01 DIAGNOSIS — M5413 Radiculopathy, cervicothoracic region: Secondary | ICD-10-CM | POA: Diagnosis not present

## 2015-07-06 ENCOUNTER — Other Ambulatory Visit: Payer: Self-pay | Admitting: Internal Medicine

## 2015-07-16 ENCOUNTER — Telehealth: Payer: Self-pay | Admitting: *Deleted

## 2015-07-16 MED ORDER — HYDROCHLOROTHIAZIDE 25 MG PO TABS: 25.0000 mg | ORAL_TABLET | Freq: Every day | ORAL | Status: DC

## 2015-07-16 NOTE — Telephone Encounter (Signed)
Left msg on triage stating needing to let md know that she is taking a whole Metoprolol & HCTZ pill now, and the pharmacy need updated scripts. Called pt back no answer LMOM md has already sent updated script for metoprolol will send [facility] new script for HCTZ...Sue [name]

## 2015-07-17 DIAGNOSIS — L292 Pruritus vulvae: Secondary | ICD-10-CM | POA: Diagnosis not present

## 2015-07-30 ENCOUNTER — Other Ambulatory Visit: Payer: Self-pay | Admitting: Neurological Surgery

## 2015-07-30 DIAGNOSIS — S065X9A Traumatic subdural hemorrhage with loss of consciousness of unspecified duration, initial encounter: Secondary | ICD-10-CM

## 2015-07-30 DIAGNOSIS — S065XAA Traumatic subdural hemorrhage with loss of consciousness status unknown, initial encounter: Secondary | ICD-10-CM

## 2015-07-31 ENCOUNTER — Ambulatory Visit
Admission: RE | Admit: 2015-07-31 | Discharge: 2015-07-31 | Disposition: A | Discharge status: Home / Self Care | Payer: Medicare Other | Source: Ambulatory Visit | Attending: Neurological Surgery | Admitting: Neurological Surgery

## 2015-07-31 DIAGNOSIS — S065XAA Traumatic subdural hemorrhage with loss of consciousness status unknown, initial encounter: Secondary | ICD-10-CM

## 2015-07-31 DIAGNOSIS — D1801 Hemangioma of skin and subcutaneous tissue: Secondary | ICD-10-CM | POA: Diagnosis not present

## 2015-07-31 DIAGNOSIS — I6200 Nontraumatic subdural hemorrhage, unspecified: Secondary | ICD-10-CM | POA: Diagnosis not present

## 2015-07-31 DIAGNOSIS — L570 Actinic keratosis: Secondary | ICD-10-CM | POA: Diagnosis not present

## 2015-07-31 DIAGNOSIS — D485 Neoplasm of uncertain behavior of skin: Secondary | ICD-10-CM | POA: Diagnosis not present

## 2015-07-31 DIAGNOSIS — L812 Freckles: Secondary | ICD-10-CM | POA: Diagnosis not present

## 2015-07-31 DIAGNOSIS — L821 Other seborrheic keratosis: Secondary | ICD-10-CM | POA: Diagnosis not present

## 2015-07-31 DIAGNOSIS — S065X9A Traumatic subdural hemorrhage with loss of consciousness of unspecified duration, initial encounter: Secondary | ICD-10-CM

## 2015-08-04 DIAGNOSIS — I10 Essential (primary) hypertension: Secondary | ICD-10-CM | POA: Diagnosis not present

## 2015-08-04 DIAGNOSIS — I6200 Nontraumatic subdural hemorrhage, unspecified: Secondary | ICD-10-CM | POA: Diagnosis not present

## 2015-08-14 ENCOUNTER — Encounter: Payer: Self-pay | Admitting: Internal Medicine

## 2015-08-14 ENCOUNTER — Other Ambulatory Visit: Payer: Self-pay | Admitting: Internal Medicine

## 2015-08-14 DIAGNOSIS — I10 Essential (primary) hypertension: Secondary | ICD-10-CM | POA: Insufficient documentation

## 2015-08-14 DIAGNOSIS — H524 Presbyopia: Secondary | ICD-10-CM | POA: Diagnosis not present

## 2015-08-14 DIAGNOSIS — H26492 Other secondary cataract, left eye: Secondary | ICD-10-CM | POA: Diagnosis not present

## 2015-08-14 DIAGNOSIS — H25811 Combined forms of age-related cataract, right eye: Secondary | ICD-10-CM | POA: Diagnosis not present

## 2015-08-14 DIAGNOSIS — Z0000 Encounter for general adult medical examination without abnormal findings: Secondary | ICD-10-CM

## 2015-08-14 DIAGNOSIS — Z124 Encounter for screening for malignant neoplasm of cervix: Secondary | ICD-10-CM | POA: Diagnosis not present

## 2015-08-14 DIAGNOSIS — Z01419 Encounter for gynecological examination (general) (routine) without abnormal findings: Secondary | ICD-10-CM | POA: Diagnosis not present

## 2015-08-14 DIAGNOSIS — H04123 Dry eye syndrome of bilateral lacrimal glands: Secondary | ICD-10-CM | POA: Diagnosis not present

## 2015-08-14 DIAGNOSIS — E785 Hyperlipidemia, unspecified: Secondary | ICD-10-CM

## 2015-08-14 DIAGNOSIS — D069 Carcinoma in situ of cervix, unspecified: Secondary | ICD-10-CM | POA: Diagnosis not present

## 2015-08-16 ENCOUNTER — Encounter (HOSPITAL_COMMUNITY): Payer: Self-pay | Admitting: Emergency Medicine

## 2015-08-16 ENCOUNTER — Emergency Department (HOSPITAL_COMMUNITY)
Admission: EM | Admit: 2015-08-16 | Discharge: 2015-08-16 | Disposition: A | Discharge status: Home / Self Care | Payer: Medicare Other | Attending: Emergency Medicine | Admitting: Emergency Medicine

## 2015-08-16 ENCOUNTER — Emergency Department (HOSPITAL_COMMUNITY): Payer: Medicare Other

## 2015-08-16 DIAGNOSIS — Z91040 Latex allergy status: Secondary | ICD-10-CM | POA: Insufficient documentation

## 2015-08-16 DIAGNOSIS — S0990XA Unspecified injury of head, initial encounter: Secondary | ICD-10-CM | POA: Diagnosis present

## 2015-08-16 DIAGNOSIS — S060X0A Concussion without loss of consciousness, initial encounter: Secondary | ICD-10-CM

## 2015-08-16 DIAGNOSIS — Z8619 Personal history of other infectious and parasitic diseases: Secondary | ICD-10-CM | POA: Insufficient documentation

## 2015-08-16 DIAGNOSIS — M1990 Unspecified osteoarthritis, unspecified site: Secondary | ICD-10-CM | POA: Diagnosis not present

## 2015-08-16 DIAGNOSIS — Z79899 Other long term (current) drug therapy: Secondary | ICD-10-CM | POA: Insufficient documentation

## 2015-08-16 DIAGNOSIS — Y9352 Activity, horseback riding: Secondary | ICD-10-CM | POA: Insufficient documentation

## 2015-08-16 DIAGNOSIS — G4700 Insomnia, unspecified: Secondary | ICD-10-CM | POA: Diagnosis not present

## 2015-08-16 DIAGNOSIS — Y998 Other external cause status: Secondary | ICD-10-CM | POA: Diagnosis not present

## 2015-08-16 DIAGNOSIS — I10 Essential (primary) hypertension: Secondary | ICD-10-CM | POA: Insufficient documentation

## 2015-08-16 DIAGNOSIS — Y9289 Other specified places as the place of occurrence of the external cause: Secondary | ICD-10-CM | POA: Diagnosis not present

## 2015-08-16 DIAGNOSIS — Z8781 Personal history of (healed) traumatic fracture: Secondary | ICD-10-CM | POA: Insufficient documentation

## 2015-08-16 DIAGNOSIS — R51 Headache: Secondary | ICD-10-CM | POA: Diagnosis not present

## 2015-08-16 DIAGNOSIS — S098XXA Other specified injuries of head, initial encounter: Secondary | ICD-10-CM | POA: Diagnosis not present

## 2015-08-16 DIAGNOSIS — Z880 Allergy status to penicillin: Secondary | ICD-10-CM | POA: Insufficient documentation

## 2015-08-16 DIAGNOSIS — S060X9A Concussion with loss of consciousness of unspecified duration, initial encounter: Secondary | ICD-10-CM | POA: Insufficient documentation

## 2015-08-16 DIAGNOSIS — Z9089 Acquired absence of other organs: Secondary | ICD-10-CM | POA: Insufficient documentation

## 2015-08-16 HISTORY — DX: Traumatic subdural hemorrhage with loss of consciousness of unspecified duration, initial encounter: S06.5X9A

## 2015-08-16 NOTE — ED Notes (Signed)
Pt sts fell off horse today hitting the back of her head; no LOC: pt had recent subdural from previous fall and wants to be checked; pt sts left buttocks also hit from fall

## 2015-08-16 NOTE — Discharge Instructions (Signed)
Head Injury, Adult You have a head injury. Headaches and throwing up (vomiting) are common after a head injury. It should be easy to wake up from sleeping. Sometimes you must stay in the hospital. Most problems happen within the first 24 hours. Side effects may occur up to 7-10 days after the injury.  WHAT ARE THE TYPES OF HEAD INJURIES? Head injuries can be as minor as a bump. Some head injuries can be more severe. More severe head injuries include:  A jarring injury to the brain (concussion).  A bruise of the brain (contusion). This mean there is bleeding in the brain that can cause swelling.  A cracked skull (skull fracture).  Bleeding in the brain that collects, clots, and forms a bump (hematoma). WHEN SHOULD I GET HELP RIGHT AWAY?   You are confused or sleepy.  You cannot be woken up.  You feel sick to your stomach (nauseous) or keep throwing up (vomiting).  Your dizziness or unsteadiness is getting worse.  You have very bad, lasting headaches that are not helped by medicine. Take medicines only as told by your doctor.  You cannot use your arms or legs like normal.  You cannot walk.  You notice changes in the black spots in the center of the colored part of your eye (pupil).  You have clear or bloody fluid coming from your nose or ears.  You have trouble seeing. During the next 24 hours after the injury, you must stay with someone who can watch you. This person should get help right away (call 911 in the [place] if you start to shake and are not able to control it (have seizures), you pass out, or you are unable to wake up. HOW CAN I PREVENT A HEAD INJURY IN THE FUTURE?  Wear seat belts.  Wear a helmet while bike riding and playing sports like football.  Stay away from dangerous activities around the house. WHEN CAN I RETURN TO NORMAL ACTIVITIES AND ATHLETICS? See your doctor before doing these activities. You should not do normal activities or play contact sports until 1  week after the following symptoms have stopped:  Headache that does not go away.  Dizziness.  Poor attention.  Confusion.  Memory problems.  Sickness to your stomach or throwing up.  Tiredness.  Fussiness.  Bothered by bright lights or loud noises.  Anxiousness or depression.  Restless sleep. MAKE SURE YOU:   Understand these instructions.  Will watch your condition.  Will get help right away if you are not doing well or get worse.   This information is not intended to replace advice given to you by your health care provider. Make sure you discuss any questions you have with your health care provider.   Document Released: 08/12/2008 Document Revised: 09/20/2014 Document Reviewed: 05/07/2013 Elsevier Interactive Patient Education 2016 [place], Adult A concussion, or closed-head injury, is a brain injury caused by a direct blow to the head or by a quick and sudden movement (jolt) of the head or neck. Concussions are usually not life-threatening. Even so, the effects of a concussion can be serious. If you have had a concussion before, you are more likely to experience concussion-like symptoms after a direct blow to the head.  CAUSES  Direct blow to the head, such as from running into another player during a soccer game, being hit in a fight, or hitting your head on a hard surface.  A jolt of the head or neck that causes the  brain to move back and forth inside the skull, such as in a car crash. SIGNS AND SYMPTOMS The signs of a concussion can be hard to notice. Early on, they may be missed by you, family members, and health care providers. You may look fine but act or feel differently. Symptoms are usually temporary, but they may last for days, weeks, or even longer. Some symptoms may appear right away while others may not show up for hours or days. Every head injury is different. Symptoms include:  Mild to moderate headaches that will not go away.  A  feeling of pressure inside your head.  Having more trouble than usual:  Learning or remembering things you have heard.  Answering questions.  Paying attention or concentrating.  Organizing daily tasks.  Making decisions and solving problems.  Slowness in thinking, acting or reacting, speaking, or reading.  Getting lost or being easily confused.  Feeling tired all the time or lacking energy (fatigued).  Feeling drowsy.  Sleep disturbances.  Sleeping more than usual.  Sleeping less than usual.  Trouble falling asleep.  Trouble sleeping (insomnia).  Loss of balance or feeling lightheaded or dizzy.  Nausea or vomiting.  Numbness or tingling.  Increased sensitivity to:  Sounds.  Lights.  Distractions.  Vision problems or eyes that tire easily.  Diminished sense of taste or smell.  Ringing in the ears.  Mood changes such as feeling sad or anxious.  Becoming easily irritated or angry for little or no reason.  Lack of motivation.  Seeing or hearing things other people do not see or hear (hallucinations). DIAGNOSIS Your health care provider can usually diagnose a concussion based on a description of your injury and symptoms. He or she will ask whether you passed out (lost consciousness) and whether you are having trouble remembering events that happened right before and during your injury. Your evaluation might include:  A brain scan to look for signs of injury to the brain. Even if the test shows no injury, you may still have a concussion.  Blood tests to be sure other problems are not present. TREATMENT  Concussions are usually treated in an emergency department, in urgent care, or at a clinic. You may need to stay in the hospital overnight for further treatment.  Tell your health care provider if you are taking any medicines, including prescription medicines, over-the-counter medicines, and natural remedies. Some medicines, such as blood thinners  (anticoagulants) and aspirin, may increase the chance of complications. Also tell your health care provider whether you have had alcohol or are taking illegal drugs. This information may affect treatment.  Your health care provider will send you home with important instructions to follow.  How fast you will recover from a concussion depends on many factors. These factors include how severe your concussion is, what part of your brain was injured, your age, and how healthy you were before the concussion.  Most people with mild injuries recover fully. Recovery can take time. In general, recovery is slower in older persons. Also, persons who have had a concussion in the past or have other medical problems may find that it takes longer to recover from their current injury. HOME CARE INSTRUCTIONS General Instructions  Carefully follow the directions your health care provider gave you.  Only take over-the-counter or prescription medicines for pain, discomfort, or fever as directed by your health care provider.  Take only those medicines that your health care provider has approved.  Do not drink alcohol until your  health care provider says you are well enough to do so. Alcohol and certain other drugs may slow your recovery and can put you at risk of further injury.  If it is harder than usual to remember things, write them down.  If you are easily distracted, try to do one thing at a time. For example, do not try to watch TV while fixing dinner.  Talk with family members or close friends when making important decisions.  Keep all follow-up appointments. Repeated evaluation of your symptoms is recommended for your recovery.  Watch your symptoms and tell others to do the same. Complications sometimes occur after a concussion. Older adults with a brain injury may have a higher risk of serious complications, such as a blood clot on the brain.  Tell your teachers, school nurse, school counselor,  coach, athletic trainer, or work Freight forwarder about your injury, symptoms, and restrictions. Tell them about what you can or cannot do. They should watch for:  Increased problems with attention or concentration.  Increased difficulty remembering or learning new information.  Increased time needed to complete tasks or assignments.  Increased irritability or decreased ability to cope with stress.  Increased symptoms.  Rest. Rest helps the brain to heal. Make sure you:  Get plenty of sleep at night. Avoid staying up late at night.  Keep the same bedtime hours on weekends and weekdays.  Rest during the day. Take daytime naps or rest breaks when you feel tired.  Limit activities that require a lot of thought or concentration. These include:  Doing homework or job-related work.  Watching TV.  Working on the computer.  Avoid any situation where there is potential for another head injury (football, hockey, soccer, basketball, martial arts, downhill snow sports and horseback riding). Your condition will get worse every time you experience a concussion. You should avoid these activities until you are evaluated by the appropriate follow-up health care providers. Returning To Your Regular Activities You will need to return to your normal activities slowly, not all at once. You must give your body and brain enough time for recovery.  Do not return to sports or other athletic activities until your health care provider tells you it is safe to do so.  Ask your health care provider when you can drive, ride a bicycle, or operate heavy machinery. Your ability to react may be slower after a brain injury. Never do these activities if you are dizzy.  Ask your health care provider about when you can return to work or school. Preventing Another Concussion It is very important to avoid another brain injury, especially before you have recovered. In rare cases, another injury can lead to permanent brain  damage, brain swelling, or death. The risk of this is greatest during the first 7-10 days after a head injury. Avoid injuries by:  Wearing a seat belt when riding in a car.  Drinking alcohol only in moderation.  Wearing a helmet when biking, skiing, skateboarding, skating, or doing similar activities.  Avoiding activities that could lead to a second concussion, such as contact or recreational sports, until your health care provider says it is okay.  Taking safety measures in your home.  Remove clutter and tripping hazards from floors and stairways.  Use grab bars in bathrooms and handrails by stairs.  Place non-slip mats on floors and in bathtubs.  Improve lighting in dim areas. SEEK MEDICAL CARE IF:  You have increased problems paying attention or concentrating.  You have increased difficulty  remembering or learning new information.  You need more time to complete tasks or assignments than before.  You have increased irritability or decreased ability to cope with stress.  You have more symptoms than before. Seek medical care if you have any of the following symptoms for more than 2 weeks after your injury:  Lasting (chronic) headaches.  Dizziness or balance problems.  Nausea.  Vision problems.  Increased sensitivity to noise or light.  Depression or mood swings.  Anxiety or irritability.  Memory problems.  Difficulty concentrating or paying attention.  Sleep problems.  Feeling tired all the time. SEEK IMMEDIATE MEDICAL CARE IF:  You have severe or worsening headaches. These may be a sign of a blood clot in the brain.  You have weakness (even if only in one hand, leg, or part of the face).  You have numbness.  You have decreased coordination.  You vomit repeatedly.  You have increased sleepiness.  One pupil is larger than the other.  You have convulsions.  You have slurred speech.  You have increased confusion. This may be a sign of a blood  clot in the brain.  You have increased restlessness, agitation, or irritability.  You are unable to recognize people or places.  You have neck pain.  It is difficult to wake you up.  You have unusual behavior changes.  You lose consciousness. MAKE SURE YOU:  Understand these instructions.  Will watch your condition.  Will get help right away if you are not doing well or get worse.   This information is not intended to replace advice given to you by your health care provider. Make sure you discuss any questions you have with your health care provider.   Document Released: 11/20/2003 Document Revised: 09/20/2014 Document Reviewed: 03/22/2013 Elsevier Interactive Patient Education [employer].

## 2015-08-16 NOTE — ED Provider Notes (Signed)
CSN: [id number]     Arrival date & time 08/16/15  1618 History   First MD Initiated Contact with Patient 08/16/15 1812     Chief Complaint  Patient presents with  . Fall     (Consider location/radiation/quality/duration/timing/severity/associated sxs/prior Treatment) HPI  Sue [name] is a 64 y.o. female who presents for evaluation of injury to head and back from fall, while riding a horse. She states that her horse stumbled, and as it stood back up, she fell from the horses back. She struck her head and back as she fell. She was able walk afterwards, did not continue riding, and decided to come in later to get her head checked. She was subdural hematoma in July of this year, which was treated with expectant management. At this time. She denies headache, blurred vision, nausea, vomiting, weakness or dizziness. She is not taking anticoagulants. There are no other no modifying factors.   Past Medical History  Diagnosis Date  . FRACTURE, ARM, LEFT 11/15/2007  . FRACTURE, FOOT 11/15/2007  . FRACTURE, INDEX FINGER, LEFT 11/15/2007  . HYPERTENSION 11/15/2007  . INSOMNIA, CHRONIC 06/17/2009  . MITRAL VALVE PROLAPSE, HX OF 11/15/2007  . OSTEOARTHRITIS 11/15/2007  . RIB FRACTURE 11/15/2007  . Scarlet fever 11/15/2007  . TONSILLECTOMY, HX OF 11/15/2007  . Subdural hematoma [facility])    Past Surgical History  Procedure Laterality Date  . Tonsillectomy     Family History  Problem Relation Age of Onset  . Parkinsonism Other   . Cancer Mother   . Hypertension Father   . Cancer Father    Social History  Substance Use Topics  . Smoking status: Never Smoker   . Smokeless tobacco: None  . Alcohol Use: Yes     Comment: rare alcohol use   OB History    No data available     Review of Systems  All other systems reviewed and are negative.     Allergies  Latex and Penicillins  Home Medications   Prior to Admission medications   Medication Sig Start Date End Date Taking? Authorizing Provider   ALPRAZolam (XANAX) 0.25 MG tablet TAKE 1 TABLET BY MOUTH TWICE A DAY AS NEEDED FOR ANXIETY 03/19/15   Aleksei Plotnikov V, MD  Chelated Potassium 99 MG TABS Take by mouth as needed.      Historical Provider, MD  clotrimazole [facility]) 10 MG troche  04/29/14   Historical Provider, MD  Desoximetasone (TOPICORT) 0.25 % ointment Apply bid for aphtous ulcer 07/20/11   Neena Rhymes, MD  diclofenac (VOLTAREN) 75 MG EC tablet Take 1 tablet (75 mg total) by mouth 2 (two) times daily. 05/16/15   Cassandria Anger, MD  erythromycin base (E-MYCIN) 500 MG tablet  06/28/14   Historical Provider, MD  Esterified Estrogens (MENEST) 0.3 MG tablet take 1/2 tablet my mouth every other day     Historical Provider, MD  ESTRACE VAGINAL 0.1 MG/GM vaginal cream  05/20/15   Historical Provider, MD  GLUCOSAMINE HCL PO Take by mouth daily.      Historical Provider, MD  hydrochlorothiazide (HYDRODIURIL) 25 MG tablet Take 1 tablet (25 mg total) by mouth daily. 07/16/15   Aleksei Plotnikov V, MD  lidocaine-hydrocortisone (ANAMANTEL HC) 3-0.5 % CREA  05/03/14   Historical Provider, MD  Melatonin 5 MG CAPS Take by mouth as needed.      Historical Provider, MD  metoprolol succinate (TOPROL-XL) 25 MG 24 hr tablet TAKE 1 TABLET BY MOUTH EVERY DAY 07/07/15   Tyrone Apple  Plotnikov V, MD  Multiple Vitamin (MULTIVITAMIN) capsule Take 1 capsule by mouth daily.      Historical Provider, MD  Phosphatidylserine-DHA-EPA (VAYACOG) 100-19.5-6.5 MG CAPS Take 1 capsule by mouth daily. 05/22/15   Aleksei Plotnikov V, MD  RESTASIS 0.05 % ophthalmic emulsion  05/02/14   Historical Provider, MD  zolpidem (AMBIEN) 5 MG tablet TAKE 1 TABLET BY MOUTH AT BEDTIME AS NEEDED FOR SLEEP 03/19/15   Aleksei Plotnikov V, MD   BP 187/99 mmHg  Pulse 74  Temp(Src) 98.2 F (36.8 C) (Oral)  Resp 20  SpO2 97% Physical Exam  Constitutional: She is oriented to person, place, and time. She appears well-developed and well-nourished.  HENT:  Head: Normocephalic and  atraumatic.  Right Ear: External ear normal.  Left Ear: External ear normal.  No contusions or abrasions  Eyes: Conjunctivae and EOM are normal. Pupils are equal, round, and reactive to light.  Neck: Normal range of motion and phonation normal. Neck supple.  Cardiovascular: Normal rate, regular rhythm and normal heart sounds.   Pulmonary/Chest: Effort normal and breath sounds normal. She exhibits no bony tenderness.  Abdominal: Soft. There is no tenderness.  Musculoskeletal: Normal range of motion.  Neurological: She is alert and oriented to person, place, and time. No cranial nerve deficit or sensory deficit. She exhibits normal muscle tone. Coordination normal.  No dysarthria and aphasia or nystagmus. Normal gait. Normal heel to toe.  Skin: Skin is warm, dry and intact.  Psychiatric: She has a normal mood and affect. Her behavior is normal. Judgment and thought content normal.  Nursing note and vitals reviewed.   ED Course  Procedures (including critical care time) Medications - No data to display  Patient Vitals for the past 24 hrs:  BP Temp Temp src Pulse Resp SpO2  08/16/15 1638 187/99 mmHg 98.2 F (36.8 C) Oral 74 20 97 %    6:41 PM Reevaluation with update and discussion. After initial assessment and treatment, an updated evaluation reveals no further complaints. Findings discussed with patient, all questions answered. [place] Review Labs Reviewed - No data to display  Imaging Review Ct Head Wo Contrast  08/16/2015  CLINICAL DATA:  Thrown from horse today.  LEFT-sided headache. EXAM: CT HEAD WITHOUT CONTRAST TECHNIQUE: Contiguous axial images were obtained from the base of the skull through the vertex without intravenous contrast. COMPARISON:  Multiple prior CT scans of the head most recent 07/31/2015. FINDINGS: No evidence for acute infarction, hemorrhage, mass lesion, hydrocephalus, or extra-axial fluid. Normal for age cerebral volume. No visible white  matter disease. Minor calcific change in the cavernous carotids. Previous LEFT right-sided hypodense subdural hematoma has resolved. No skull fracture. No cortical contusion. No visible sinus air-fluid level. No scalp soft tissue swelling or foreign body. Negative mastoids. IMPRESSION: Negative exam. No acute intracranial findings. Resolution of previously noted subdural hematoma. Electronically Signed   By: Staci Righter M.D.   On: 08/16/2015 18:24   I have personally reviewed and evaluated these images and lab results as part of my medical decision-making.   EKG Interpretation None      MDM   Final diagnoses:  Head injury, initial encounter  Concussion, without loss of consciousness, initial encounter    Head injury with suspected mild concussion. Indication for bleeding, and doubt that she will have that happen later. Doubt cervical, or back fracture.  Nursing Notes Reviewed/ Care Coordinated Applicable Imaging Reviewed Interpretation of Laboratory Data incorporated into ED treatment  The patient appears  reasonably screened and/or stabilized for discharge and I doubt any other medical condition or other [facility] requiring further screening, evaluation, or treatment in the ED at this time prior to discharge.  Plan: Home Medications- APAP prnreatments- restturn here if the recommended treatment, does not improve the symptoms; Recommended follow up- PCP prn     Daleen Bo, MD 08/16/15 1842

## 2015-08-19 ENCOUNTER — Other Ambulatory Visit (INDEPENDENT_AMBULATORY_CARE_PROVIDER_SITE_OTHER): Payer: Medicare Other

## 2015-08-19 DIAGNOSIS — E785 Hyperlipidemia, unspecified: Secondary | ICD-10-CM | POA: Diagnosis not present

## 2015-08-19 DIAGNOSIS — I10 Essential (primary) hypertension: Secondary | ICD-10-CM

## 2015-08-19 DIAGNOSIS — Z0000 Encounter for general adult medical examination without abnormal findings: Secondary | ICD-10-CM | POA: Diagnosis not present

## 2015-08-19 LAB — CBC WITH DIFFERENTIAL/PLATELET
BASOS ABS: 0 10*3/uL (ref 0.0–0.1)
BASOS PCT: 0.3 % (ref 0.0–3.0)
Eosinophils Absolute: 0.2 10*3/uL (ref 0.0–0.7)
Eosinophils Relative: 2.8 % (ref 0.0–5.0)
HCT: 42.2 % (ref 36.0–46.0)
Hemoglobin: 13.9 g/dL (ref 12.0–15.0)
LYMPHS ABS: 2.7 10*3/uL (ref 0.7–4.0)
Lymphocytes Relative: 30.2 % (ref 12.0–46.0)
MCHC: 33.1 g/dL (ref 30.0–36.0)
MCV: 90.4 fl (ref 78.0–100.0)
MONOS PCT: 8.2 % (ref 3.0–12.0)
Monocytes Absolute: 0.7 10*3/uL (ref 0.1–1.0)
NEUTROS ABS: 5.2 10*3/uL (ref 1.4–7.7)
NEUTROS PCT: 58.5 % (ref 43.0–77.0)
PLATELETS: 277 10*3/uL (ref 150.0–400.0)
RBC: 4.66 Mil/uL (ref 3.87–5.11)
RDW: 14.4 % (ref 11.5–15.5)
WBC: 8.9 10*3/uL (ref 4.0–10.5)

## 2015-08-19 LAB — URINALYSIS, ROUTINE W REFLEX MICROSCOPIC
Bilirubin Urine: NEGATIVE
HGB URINE DIPSTICK: NEGATIVE
KETONES UR: NEGATIVE
NITRITE: NEGATIVE
RBC / HPF: NONE SEEN (ref 0–?)
SPECIFIC GRAVITY, URINE: 1.015 (ref 1.000–1.030)
Total Protein, Urine: NEGATIVE
URINE GLUCOSE: NEGATIVE
Urobilinogen, UA: 0.2 (ref 0.0–1.0)
pH: 7 (ref 5.0–8.0)

## 2015-08-19 LAB — LIPID PANEL
CHOL/HDL RATIO: 3
CHOLESTEROL: 200 mg/dL (ref 0–200)
HDL: 60.6 mg/dL (ref >39.00)
LDL CALC: 123 mg/dL — AB (ref 0–99)
NonHDL: 139.61
TRIGLYCERIDES: 81 mg/dL (ref 0.0–149.0)
VLDL: 16.2 mg/dL (ref 0.0–40.0)

## 2015-08-19 LAB — BASIC METABOLIC PANEL
BUN: 19 mg/dL (ref 6–23)
CALCIUM: 9.4 mg/dL (ref 8.4–10.5)
CHLORIDE: 101 meq/L (ref 96–112)
CO2: 32 mEq/L (ref 19–32)
CREATININE: 0.75 mg/dL (ref 0.40–1.20)
GFR: 82.01 mL/min (ref >60.00)
Glucose, Bld: 95 mg/dL (ref 70–99)
Potassium: 4 mEq/L (ref 3.5–5.1)
Sodium: 140 mEq/L (ref 135–145)

## 2015-08-19 LAB — HEPATIC FUNCTION PANEL
ALT: 21 U/L (ref 0–35)
AST: 20 U/L (ref 0–37)
Albumin: 4.2 g/dL (ref 3.5–5.2)
Alkaline Phosphatase: 52 U/L (ref 39–117)
BILIRUBIN DIRECT: 0.1 mg/dL (ref 0.0–0.3)
BILIRUBIN TOTAL: 0.5 mg/dL (ref 0.2–1.2)
Total Protein: 7 g/dL (ref 6.0–8.3)

## 2015-08-19 LAB — TSH: TSH: 2.69 u[IU]/mL (ref 0.35–4.50)

## 2015-08-22 ENCOUNTER — Encounter: Payer: Self-pay | Admitting: Internal Medicine

## 2015-08-22 ENCOUNTER — Ambulatory Visit (INDEPENDENT_AMBULATORY_CARE_PROVIDER_SITE_OTHER): Payer: Medicare Other | Admitting: Internal Medicine

## 2015-08-22 VITALS — BP 150/82 | HR 69 | Wt 134.0 lb

## 2015-08-22 DIAGNOSIS — S065X0A Traumatic subdural hemorrhage without loss of consciousness, initial encounter: Secondary | ICD-10-CM | POA: Diagnosis not present

## 2015-08-22 DIAGNOSIS — I10 Essential (primary) hypertension: Secondary | ICD-10-CM | POA: Diagnosis not present

## 2015-08-22 DIAGNOSIS — G4700 Insomnia, unspecified: Secondary | ICD-10-CM | POA: Diagnosis not present

## 2015-08-22 MED ORDER — ZOLPIDEM TARTRATE 5 MG PO TABS: 5.0000 mg | ORAL_TABLET | Freq: Every evening | ORAL | Status: DC | PRN

## 2015-08-22 MED ORDER — ALPRAZOLAM 0.25 MG PO TABS: ORAL_TABLET | ORAL | Status: DC

## 2015-08-22 MED ORDER — TRIAMTERENE-HCTZ 37.5-25 MG PO TABS: 1.0000 | ORAL_TABLET | Freq: Every day | ORAL | Status: DC

## 2015-08-22 MED ORDER — DICLOFENAC SODIUM 75 MG PO TBEC: 75.0000 mg | DELAYED_RELEASE_TABLET | Freq: Two times a day (BID) | ORAL | Status: DC | PRN

## 2015-08-22 NOTE — Progress Notes (Signed)
Pre visit review using our clinic review tool, if applicable. No additional management support is needed unless otherwise documented below in the visit note. 

## 2015-08-22 NOTE — Progress Notes (Signed)
Subjective:  Patient ID: Sue [name], female    DOB: 06-10-1951  Age: 64 y.o. MRN: [id number]  CC: No chief complaint on file.   HPI KATHERN CHESTNUT presents for post-subdural hematoma and HTN f/u. Better on Vayacog. SBP 112-150 at home  Outpatient Prescriptions Prior to Visit  Medication Sig Dispense Refill  . Chelated Potassium 99 MG TABS Take by mouth as needed.      . clotrimazole (MYCELEX) 10 MG troche   1  . Desoximetasone (TOPICORT) 0.25 % ointment Apply bid for aphtous ulcer 15 g 2  . erythromycin base (E-MYCIN) 500 MG tablet   1  . Esterified Estrogens (MENEST) 0.3 MG tablet take 1/2 tablet my mouth every other day     . ESTRACE VAGINAL 0.1 MG/GM vaginal cream     . GLUCOSAMINE HCL PO Take by mouth daily.      Marland Kitchen lidocaine-hydrocortisone (ANAMANTEL HC) 3-0.5 % CREA   0  . Melatonin 5 MG CAPS Take by mouth as needed.      . metoprolol succinate (TOPROL-XL) 25 MG 24 hr tablet TAKE 1 TABLET BY MOUTH EVERY DAY 90 tablet 3  . Multiple Vitamin (MULTIVITAMIN) capsule Take 1 capsule by mouth daily.      . Phosphatidylserine-DHA-EPA (VAYACOG) 100-19.5-6.5 MG CAPS Take 1 capsule by mouth daily. 30 capsule 11  . RESTASIS 0.05 % ophthalmic emulsion   3  . ALPRAZolam (XANAX) 0.25 MG tablet TAKE 1 TABLET BY MOUTH TWICE A DAY AS NEEDED FOR ANXIETY 60 tablet 2  . diclofenac (VOLTAREN) 75 MG EC tablet Take 1 tablet (75 mg total) by mouth 2 (two) times daily. 60 tablet 0  . hydrochlorothiazide (HYDRODIURIL) 25 MG tablet Take 1 tablet (25 mg total) by mouth daily. 90 tablet 3  . zolpidem (AMBIEN) 5 MG tablet TAKE 1 TABLET BY MOUTH AT BEDTIME AS NEEDED FOR SLEEP 90 tablet 0   No facility-administered medications prior to visit.    ROS Review of Systems  Constitutional: Negative for chills, activity change, appetite change, fatigue and unexpected weight change.  HENT: Negative for congestion, mouth sores and sinus pressure.   Eyes: Negative for visual disturbance.  Respiratory:  Negative for cough and chest tightness.   Gastrointestinal: Negative for nausea and abdominal pain.  Genitourinary: Negative for frequency, difficulty urinating and vaginal pain.  Musculoskeletal: Negative for back pain and gait problem.  Skin: Negative for pallor and rash.  Neurological: Negative for dizziness, tremors, weakness, numbness and headaches.  Psychiatric/Behavioral: Negative for confusion and sleep disturbance.    Objective:  BP 150/82 mmHg  Pulse 69  Wt 134 lb (60.782 kg)  SpO2 98%  BP Readings from Last 3 Encounters:  08/22/15 150/82  08/16/15 132/74  05/22/15 145/85    Wt Readings from Last 3 Encounters:  08/22/15 134 lb (60.782 kg)  05/22/15 131 lb (59.421 kg)  07/25/14 128 lb (58.06 kg)    Physical Exam  Constitutional: She appears well-developed. No distress.  HENT:  Head: Normocephalic.  Right Ear: External ear normal.  Left Ear: External ear normal.  Nose: Nose normal.  Mouth/Throat: Oropharynx is clear and moist.  Eyes: Conjunctivae are normal. Pupils are equal, round, and reactive to light. Right eye exhibits no discharge. Left eye exhibits no discharge.  Neck: Normal range of motion. Neck supple. No JVD present. No tracheal deviation present. No thyromegaly present.  Cardiovascular: Normal rate, regular rhythm and normal heart sounds.   Pulmonary/Chest: No stridor. No respiratory distress. She has no wheezes.  Abdominal: Soft. Bowel sounds are normal. She exhibits no distension and no mass. There is no tenderness. There is no rebound and no guarding.  Musculoskeletal: She exhibits no edema or tenderness.  Lymphadenopathy:    She has no cervical adenopathy.  Neurological: She displays normal reflexes. No cranial nerve deficit. She exhibits normal muscle tone. Coordination normal.  Skin: No rash noted. No erythema.  Psychiatric: She has a normal mood and affect. Her behavior is normal. Judgment and thought content normal.    Lab Results    Component Value Date   WBC 8.9 08/19/2015   HGB 13.9 08/19/2015   HCT 42.2 08/19/2015   PLT 277.0 08/19/2015   GLUCOSE 95 08/19/2015   CHOL 200 08/19/2015   TRIG 81.0 08/19/2015   HDL 60.60 08/19/2015   LDLDIRECT 118.2 07/19/2013   LDLCALC 123* 08/19/2015   ALT 21 08/19/2015   AST 20 08/19/2015   NA 140 08/19/2015   K 4.0 08/19/2015   CL 101 08/19/2015   CREATININE 0.75 08/19/2015   BUN 19 08/19/2015   CO2 32 08/19/2015   TSH 2.69 08/19/2015    Ct Head Wo Contrast  08/16/2015  CLINICAL DATA:  Thrown from horse today.  LEFT-sided headache. EXAM: CT HEAD WITHOUT CONTRAST TECHNIQUE: Contiguous axial images were obtained from the base of the skull through the vertex without intravenous contrast. COMPARISON:  Multiple prior CT scans of the head most recent 07/31/2015. FINDINGS: No evidence for acute infarction, hemorrhage, mass lesion, hydrocephalus, or extra-axial fluid. Normal for age cerebral volume. No visible white matter disease. Minor calcific change in the cavernous carotids. Previous LEFT right-sided hypodense subdural hematoma has resolved. No skull fracture. No cortical contusion. No visible sinus air-fluid level. No scalp soft tissue swelling or foreign body. Negative mastoids. IMPRESSION: Negative exam. No acute intracranial findings. Resolution of previously noted subdural hematoma. Electronically Signed   By: Staci Righter M.D.   On: 08/16/2015 18:24    Assessment & Plan:   There are no diagnoses linked to this encounter. I have discontinued Ms. Gandolfo's hydrochlorothiazide. I have also changed her diclofenac. Additionally, I am having her start on triamterene-hydrochlorothiazide. Lastly, I am having her maintain her Chelated Potassium, GLUCOSAMINE HCL PO, Melatonin, Esterified Estrogens, multivitamin, Desoximetasone, erythromycin base, lidocaine-hydrocortisone, RESTASIS, clotrimazole, ESTRACE VAGINAL, Phosphatidylserine-DHA-EPA, metoprolol succinate, calcium-vitamin D,  LATISSE, ALPRAZolam, and zolpidem.  Meds ordered this encounter  Medications  . calcium-vitamin D ([employer] OYSTER SHELL CALCIUM-VIT D) 500-200 MG-UNIT tablet    Sig: Take 2 tablets by mouth daily.  Marland Kitchen LATISSE 0.03 % ophthalmic solution    Sig: as needed.    Refill:  6  . diclofenac (VOLTAREN) 75 MG EC tablet    Sig: Take 1 tablet (75 mg total) by mouth 2 (two) times daily as needed.    Dispense:  60 tablet    Refill:  3  . triamterene-hydrochlorothiazide (MAXZIDE-25) 37.5-25 MG tablet    Sig: Take 1 tablet by mouth daily.    Dispense:  90 tablet    Refill:  3  . DISCONTD: zolpidem (AMBIEN) 5 MG tablet    Sig: Take 1 tablet (5 mg total) by mouth at bedtime as needed. for sleep    Dispense:  90 tablet    Refill:  0  . ALPRAZolam (XANAX) 0.25 MG tablet    Sig: TAKE 1 TABLET BY MOUTH TWICE A DAY AS NEEDED FOR ANXIETY    Dispense:  60 tablet    Refill:  2  . zolpidem (AMBIEN) 5 MG tablet  Sig: Take 1 tablet (5 mg total) by mouth at bedtime as needed. for sleep    Dispense:  90 tablet    Refill:  0     Follow-up: Return in about 6 months (around 02/20/2016) for Wellness Exam.  Walker Kehr, MD

## 2015-08-23 ENCOUNTER — Encounter: Payer: Self-pay | Admitting: Internal Medicine

## 2015-08-23 NOTE — Assessment & Plan Note (Addendum)
Toprol 12/16 added Maxzide

## 2015-08-23 NOTE — Assessment & Plan Note (Signed)
Zolpidem taken as needed

## 2015-08-23 NOTE — Assessment & Plan Note (Signed)
Memory is better on Vayacog Recent head CT ok

## 2015-08-28 DIAGNOSIS — H26492 Other secondary cataract, left eye: Secondary | ICD-10-CM | POA: Diagnosis not present

## 2015-09-17 ENCOUNTER — Encounter: Payer: Self-pay | Admitting: Podiatry

## 2015-09-17 ENCOUNTER — Ambulatory Visit (INDEPENDENT_AMBULATORY_CARE_PROVIDER_SITE_OTHER): Payer: Medicare Other | Admitting: Podiatry

## 2015-09-17 VITALS — BP 131/75 | HR 59 | Resp 16

## 2015-09-17 DIAGNOSIS — L84 Corns and callosities: Secondary | ICD-10-CM

## 2015-09-22 NOTE — Progress Notes (Signed)
Subjective:     Patient ID: Sue [name], female   DOB: 08/13/1951, 65 y.o.   MRN: [id number]  HPI patient presents with a lesion plantar aspect left foot that's been painful and making it hard to walk   Review of Systems     Objective:   Physical Exam Neurovascular status unchanged with thick keratotic lesion submetatarsal left that Richard hard to bear weight in a comfortable fashion    Assessment:     Plantarflexed metatarsal with lesion formation    Plan:     Debride lesion with no iatrogenic bleeding noted

## 2015-11-05 ENCOUNTER — Encounter: Payer: Self-pay | Admitting: Internal Medicine

## 2015-12-11 DIAGNOSIS — H25811 Combined forms of age-related cataract, right eye: Secondary | ICD-10-CM | POA: Diagnosis not present

## 2015-12-11 DIAGNOSIS — H04123 Dry eye syndrome of bilateral lacrimal glands: Secondary | ICD-10-CM | POA: Diagnosis not present

## 2015-12-12 DIAGNOSIS — L308 Other specified dermatitis: Secondary | ICD-10-CM | POA: Diagnosis not present

## 2015-12-12 DIAGNOSIS — L300 Nummular dermatitis: Secondary | ICD-10-CM | POA: Diagnosis not present

## 2015-12-18 ENCOUNTER — Telehealth: Payer: Self-pay | Admitting: Internal Medicine

## 2015-12-18 NOTE — Telephone Encounter (Signed)
Patient has cancelled six month fu for June because she will be out of town that month.  Patient states she is doing fine and would rather wait to do her 12 month fu.  Patient wanted to make sure this was ok with Dr. Alain Marion.

## 2015-12-18 NOTE — Telephone Encounter (Signed)
Ok Thx 

## 2015-12-22 NOTE — Telephone Encounter (Signed)
Pt informed and will call in September to schedule annual.

## 2016-01-21 ENCOUNTER — Ambulatory Visit (INDEPENDENT_AMBULATORY_CARE_PROVIDER_SITE_OTHER): Payer: Medicare Other | Admitting: Podiatry

## 2016-01-21 ENCOUNTER — Encounter: Payer: Self-pay | Admitting: Podiatry

## 2016-01-21 VITALS — BP 137/73 | HR 63 | Resp 16

## 2016-01-21 DIAGNOSIS — L84 Corns and callosities: Secondary | ICD-10-CM

## 2016-01-21 NOTE — Progress Notes (Signed)
Subjective:     Patient ID: Sue [name], female   DOB: Nov 01, 1950, 65 y.o.   MRN: [id number]  HPI patient presents with painful lesion underneath the left foot   Review of Systems     Objective:   Physical Exam Neurovascular status intact with keratotic lesion sub-left foot that's painful    Assessment:     Painful lesion left    Plan:     Debride lesion left with no iatrogenic bleeding noted

## 2016-01-29 DIAGNOSIS — L812 Freckles: Secondary | ICD-10-CM | POA: Diagnosis not present

## 2016-01-29 DIAGNOSIS — L570 Actinic keratosis: Secondary | ICD-10-CM | POA: Diagnosis not present

## 2016-01-29 DIAGNOSIS — L438 Other lichen planus: Secondary | ICD-10-CM | POA: Diagnosis not present

## 2016-01-29 DIAGNOSIS — L308 Other specified dermatitis: Secondary | ICD-10-CM | POA: Diagnosis not present

## 2016-01-29 DIAGNOSIS — L821 Other seborrheic keratosis: Secondary | ICD-10-CM | POA: Diagnosis not present

## 2016-01-30 ENCOUNTER — Encounter: Payer: Self-pay | Admitting: Internal Medicine

## 2016-01-30 ENCOUNTER — Other Ambulatory Visit (INDEPENDENT_AMBULATORY_CARE_PROVIDER_SITE_OTHER): Payer: Medicare Other

## 2016-01-30 ENCOUNTER — Ambulatory Visit (INDEPENDENT_AMBULATORY_CARE_PROVIDER_SITE_OTHER): Payer: Medicare Other | Admitting: Internal Medicine

## 2016-01-30 VITALS — BP 160/82 | HR 59 | Temp 97.9°F | Wt 132.0 lb

## 2016-01-30 DIAGNOSIS — R1013 Epigastric pain: Secondary | ICD-10-CM

## 2016-01-30 LAB — HEPATIC FUNCTION PANEL
ALBUMIN: 4.1 g/dL (ref 3.5–5.2)
ALK PHOS: 49 U/L (ref 39–117)
ALT: 15 U/L (ref 0–35)
AST: 16 U/L (ref 0–37)
Bilirubin, Direct: 0 mg/dL (ref 0.0–0.3)
TOTAL PROTEIN: 6.5 g/dL (ref 6.0–8.3)
Total Bilirubin: 0.3 mg/dL (ref 0.2–1.2)

## 2016-01-30 LAB — BASIC METABOLIC PANEL
BUN: 17 mg/dL (ref 6–23)
CO2: 30 mEq/L (ref 19–32)
Calcium: 9.2 mg/dL (ref 8.4–10.5)
Chloride: 102 mEq/L (ref 96–112)
Creatinine, Ser: 0.75 mg/dL (ref 0.40–1.20)
GFR: 81.9 mL/min (ref >60.00)
GLUCOSE: 104 mg/dL — AB (ref 70–99)
POTASSIUM: 3.8 meq/L (ref 3.5–5.1)
SODIUM: 139 meq/L (ref 135–145)

## 2016-01-30 LAB — LIPASE: LIPASE: 22 U/L (ref 11.0–59.0)

## 2016-01-30 LAB — CBC WITH DIFFERENTIAL/PLATELET
BASOS ABS: 0 10*3/uL (ref 0.0–0.1)
Basophils Relative: 0.5 % (ref 0.0–3.0)
EOS PCT: 3 % (ref 0.0–5.0)
Eosinophils Absolute: 0.2 10*3/uL (ref 0.0–0.7)
HEMATOCRIT: 38.9 % (ref 36.0–46.0)
HEMOGLOBIN: 13.1 g/dL (ref 12.0–15.0)
LYMPHS ABS: 2.5 10*3/uL (ref 0.7–4.0)
LYMPHS PCT: 31.3 % (ref 12.0–46.0)
MCHC: 33.7 g/dL (ref 30.0–36.0)
MCV: 90 fl (ref 78.0–100.0)
MONOS PCT: 8.3 % (ref 3.0–12.0)
Monocytes Absolute: 0.6 10*3/uL (ref 0.1–1.0)
Neutro Abs: 4.5 10*3/uL (ref 1.4–7.7)
Neutrophils Relative %: 56.9 % (ref 43.0–77.0)
Platelets: 307 10*3/uL (ref 150.0–400.0)
RBC: 4.33 Mil/uL (ref 3.87–5.11)
RDW: 13.7 % (ref 11.5–15.5)
WBC: 7.8 10*3/uL (ref 4.0–10.5)

## 2016-01-30 LAB — H. PYLORI ANTIBODY, IGG: H Pylori IgG: NEGATIVE

## 2016-01-30 NOTE — Progress Notes (Signed)
Subjective:  Patient ID: Sue [name], female    DOB: October 20, 1950  Age: 65 y.o. MRN: [id number]  CC: No chief complaint on file.   HPI Sue [name] presents for abd pain and bloating  x1 week off and on in the upper stomach area. There was no n/v. It was moderate. Pain has resolved in 1 week.  Outpatient Prescriptions Prior to Visit  Medication Sig Dispense Refill  . ALPRAZolam (XANAX) 0.25 MG tablet TAKE 1 TABLET BY MOUTH TWICE A DAY AS NEEDED FOR ANXIETY 60 tablet 2  . calcium-vitamin D ([employer] OYSTER SHELL CALCIUM-VIT D) 500-200 MG-UNIT tablet Take 2 tablets by mouth daily.    . Chelated Potassium 99 MG TABS Take by mouth as needed.      . clotrimazole (MYCELEX) 10 MG troche   1  . Desoximetasone (TOPICORT) 0.25 % ointment Apply bid for aphtous ulcer 15 g 2  . diclofenac (VOLTAREN) 75 MG EC tablet Take 1 tablet (75 mg total) by mouth 2 (two) times daily as needed. 60 tablet 3  . erythromycin base (E-MYCIN) 500 MG tablet   1  . Esterified Estrogens (MENEST) 0.3 MG tablet take 1/2 tablet my mouth every other day     . ESTRACE VAGINAL 0.1 MG/GM vaginal cream     . GLUCOSAMINE HCL PO Take by mouth daily.      Marland Kitchen LATISSE 0.03 % ophthalmic solution as needed.  6  . lidocaine-hydrocortisone (ANAMANTEL HC) 3-0.5 % CREA   0  . Melatonin 5 MG CAPS Take by mouth as needed.      . metoprolol succinate (TOPROL-XL) 25 MG 24 hr tablet TAKE 1 TABLET BY MOUTH EVERY DAY 90 tablet 3  . Multiple Vitamin (MULTIVITAMIN) capsule Take 1 capsule by mouth daily.      . Phosphatidylserine-DHA-EPA (VAYACOG) 100-19.5-6.5 MG CAPS Take 1 capsule by mouth daily. 30 capsule 11  . RESTASIS 0.05 % ophthalmic emulsion   3  . triamterene-hydrochlorothiazide (MAXZIDE-25) 37.5-25 MG tablet Take 1 tablet by mouth daily. 90 tablet 3  . zolpidem (AMBIEN) 5 MG tablet Take 1 tablet (5 mg total) by mouth at bedtime as needed. for sleep 90 tablet 0   No facility-administered medications prior to visit.     ROS Review of Systems  Constitutional: Negative for chills, activity change, appetite change, fatigue and unexpected weight change.  HENT: Negative for congestion, mouth sores and sinus pressure.   Eyes: Negative for visual disturbance.  Respiratory: Negative for cough and chest tightness.   Gastrointestinal: Negative for nausea and abdominal pain.  Genitourinary: Negative for frequency, difficulty urinating and vaginal pain.  Musculoskeletal: Negative for back pain and gait problem.  Skin: Negative for pallor and rash.  Neurological: Negative for dizziness, tremors, weakness, numbness and headaches.  Psychiatric/Behavioral: Negative for confusion and sleep disturbance.    Objective:  BP 160/82 mmHg  Pulse 59  Temp(Src) 97.9 F (36.6 C) (Oral)  Wt 132 lb (59.875 kg)  SpO2 98%  BP Readings from Last 3 Encounters:  01/30/16 160/82  01/21/16 137/73  09/17/15 131/75    Wt Readings from Last 3 Encounters:  01/30/16 132 lb (59.875 kg)  08/22/15 134 lb (60.782 kg)  05/22/15 131 lb (59.421 kg)    Physical Exam  Constitutional: She appears well-developed. No distress.  HENT:  Head: Normocephalic.  Right Ear: External ear normal.  Left Ear: External ear normal.  Nose: Nose normal.  Mouth/Throat: Oropharynx is clear and moist.  Eyes: Conjunctivae are normal. Pupils are  equal, round, and reactive to light. Right eye exhibits no discharge. Left eye exhibits no discharge.  Neck: Normal range of motion. Neck supple. No JVD present. No tracheal deviation present. No thyromegaly present.  Cardiovascular: Normal rate, regular rhythm and normal heart sounds.   Pulmonary/Chest: No stridor. No respiratory distress. She has no wheezes.  Abdominal: Soft. Bowel sounds are normal. She exhibits no distension and no mass. There is no tenderness. There is no rebound and no guarding.  Musculoskeletal: She exhibits no edema or tenderness.  Lymphadenopathy:    She has no cervical adenopathy.   Neurological: She displays normal reflexes. No cranial nerve deficit. She exhibits normal muscle tone. Coordination normal.  Skin: No rash noted. No erythema.  Psychiatric: She has a normal mood and affect. Her behavior is normal. Judgment and thought content normal.    Lab Results  Component Value Date   WBC 8.9 08/19/2015   HGB 13.9 08/19/2015   HCT 42.2 08/19/2015   PLT 277.0 08/19/2015   GLUCOSE 95 08/19/2015   CHOL 200 08/19/2015   TRIG 81.0 08/19/2015   HDL 60.60 08/19/2015   LDLDIRECT 118.2 07/19/2013   LDLCALC 123* 08/19/2015   ALT 21 08/19/2015   AST 20 08/19/2015   NA 140 08/19/2015   K 4.0 08/19/2015   CL 101 08/19/2015   CREATININE 0.75 08/19/2015   BUN 19 08/19/2015   CO2 32 08/19/2015   TSH 2.69 08/19/2015    Ct Head Wo Contrast  08/16/2015  CLINICAL DATA:  Thrown from horse today.  LEFT-sided headache. EXAM: CT HEAD WITHOUT CONTRAST TECHNIQUE: Contiguous axial images were obtained from the base of the skull through the vertex without intravenous contrast. COMPARISON:  Multiple prior CT scans of the head most recent 07/31/2015. FINDINGS: No evidence for acute infarction, hemorrhage, mass lesion, hydrocephalus, or extra-axial fluid. Normal for age cerebral volume. No visible white matter disease. Minor calcific change in the cavernous carotids. Previous LEFT right-sided hypodense subdural hematoma has resolved. No skull fracture. No cortical contusion. No visible sinus air-fluid level. No scalp soft tissue swelling or foreign body. Negative mastoids. IMPRESSION: Negative exam. No acute intracranial findings. Resolution of previously noted subdural hematoma. Electronically Signed   By: Staci Righter M.D.   On: 08/16/2015 18:24    Assessment & Plan:   There are no diagnoses linked to this encounter. I am having [name] maintain her Chelated Potassium, GLUCOSAMINE HCL PO, Melatonin, Esterified Estrogens, multivitamin, Desoximetasone, erythromycin base,  lidocaine-hydrocortisone, RESTASIS, clotrimazole, ESTRACE VAGINAL, Phosphatidylserine-DHA-EPA, metoprolol succinate, calcium-vitamin D, LATISSE, diclofenac, triamterene-hydrochlorothiazide, ALPRAZolam, and zolpidem.  No orders of the defined types were placed in this encounter.     Follow-up: No Follow-up on file.  Walker Kehr, MD

## 2016-01-30 NOTE — Progress Notes (Signed)
Pre visit review using our clinic review tool, if applicable. No additional management support is needed unless otherwise documented below in the visit note. 

## 2016-01-30 NOTE — Assessment & Plan Note (Signed)
Labs incl H pilori CT abd offered - declined Nexium OTC PRN

## 2016-02-01 ENCOUNTER — Encounter: Payer: Self-pay | Admitting: Internal Medicine

## 2016-02-02 ENCOUNTER — Other Ambulatory Visit (INDEPENDENT_AMBULATORY_CARE_PROVIDER_SITE_OTHER): Payer: Medicare Other

## 2016-02-02 DIAGNOSIS — R1013 Epigastric pain: Secondary | ICD-10-CM

## 2016-02-02 LAB — URINALYSIS
Bilirubin Urine: NEGATIVE
HGB URINE DIPSTICK: NEGATIVE
Ketones, ur: NEGATIVE
Leukocytes, UA: NEGATIVE
NITRITE: NEGATIVE
Specific Gravity, Urine: 1.015 (ref 1.000–1.030)
TOTAL PROTEIN, URINE-UPE24: NEGATIVE
UROBILINOGEN UA: 0.2 (ref 0.0–1.0)
Urine Glucose: NEGATIVE
pH: 6.5 (ref 5.0–8.0)

## 2016-02-04 ENCOUNTER — Encounter: Payer: Self-pay | Admitting: Internal Medicine

## 2016-02-05 ENCOUNTER — Ambulatory Visit (INDEPENDENT_AMBULATORY_CARE_PROVIDER_SITE_OTHER): Payer: Medicare Other | Admitting: Family

## 2016-02-05 ENCOUNTER — Encounter: Payer: Self-pay | Admitting: Family

## 2016-02-05 VITALS — BP 118/60 | HR 79 | Temp 98.3°F | Ht 64.5 in | Wt 130.0 lb

## 2016-02-05 DIAGNOSIS — R1030 Lower abdominal pain, unspecified: Secondary | ICD-10-CM

## 2016-02-05 NOTE — Progress Notes (Signed)
Subjective:    Patient ID: Sue [name], female    DOB: 09/21/1950, 65 y.o.   MRN: [id number]   Sue [name] is a 65 y.o. female who presents today for an acute visit.    HPI Comments: Seen 5/19 for upper abdominal pain and bloating which was resolved during visit. I reviewed lab work from that day, CBC, CMP, lipase were normal. H. pylori was negative.  Pain recurred 4 days ago in lower abdomen, is significantly improved. Drinking. Notes loss of appetite.   Per chart review, colonoscopy 2010 shows diverticulosis. No History of gallstones, pancreatitis, colon cancer.  Abdominal Pain This is a recurrent problem. The current episode started in the past 7 days. The onset quality is sudden. The problem occurs intermittently. The problem has been gradually improving. The pain is located in the LLQ and RLQ. The pain is mild. The quality of the pain is cramping. The abdominal pain does not radiate. Associated symptoms include constipation (chronic; takes metamucil ), a fever (102, resolved) and flatus. Pertinent negatives include no belching, diarrhea, dysuria, frequency, nausea or vomiting. Nothing aggravates the pain. The pain is relieved by bowel movements. Treatments tried: ducolax. The treatment provided mild relief. There is no history of abdominal surgery, colon cancer, gallstones or pancreatitis.   Past Medical History  Diagnosis Date  . FRACTURE, ARM, LEFT 11/15/2007  . FRACTURE, FOOT 11/15/2007  . FRACTURE, INDEX FINGER, LEFT 11/15/2007  . HYPERTENSION 11/15/2007  . INSOMNIA, CHRONIC 06/17/2009  . MITRAL VALVE PROLAPSE, HX OF 11/15/2007  . OSTEOARTHRITIS 11/15/2007  . RIB FRACTURE 11/15/2007  . Scarlet fever 11/15/2007  . TONSILLECTOMY, HX OF 11/15/2007  . Subdural hematoma (HCC)    Allergies: Clindamycin; Latex; and Penicillins Current Outpatient Prescriptions on File Prior to Visit  Medication Sig Dispense Refill  . ALPRAZolam (XANAX) 0.25 MG tablet TAKE 1 TABLET BY MOUTH TWICE A  DAY AS NEEDED FOR ANXIETY 60 tablet 2  . calcium-vitamin D ([facility] OYSTER SHELL CALCIUM-VIT D) 500-200 MG-UNIT tablet Take 2 tablets by mouth daily.    . Chelated Potassium 99 MG TABS Take by mouth as needed.      . clotrimazole (MYCELEX) 10 MG troche   1  . Desoximetasone (TOPICORT) 0.25 % ointment Apply bid for aphtous ulcer 15 g 2  . diclofenac (VOLTAREN) 75 MG EC tablet Take 1 tablet (75 mg total) by mouth 2 (two) times daily as needed. 60 tablet 3  . erythromycin base (E-MYCIN) 500 MG tablet   1  . Esterified Estrogens (MENEST) 0.3 MG tablet take 1/2 tablet my mouth every other day     . ESTRACE VAGINAL 0.1 MG/GM vaginal cream     . GLUCOSAMINE HCL PO Take by mouth daily.      Marland Kitchen LATISSE 0.03 % ophthalmic solution as needed.  6  . lidocaine-hydrocortisone (ANAMANTEL HC) 3-0.5 % CREA   0  . Melatonin 5 MG CAPS Take by mouth as needed.      . metoprolol succinate (TOPROL-XL) 25 MG 24 hr tablet TAKE 1 TABLET BY MOUTH EVERY DAY 90 tablet 3  . Multiple Vitamin (MULTIVITAMIN) capsule Take 1 capsule by mouth daily.      . Phosphatidylserine-DHA-EPA (VAYACOG) 100-19.5-6.5 MG CAPS Take 1 capsule by mouth daily. 30 capsule 11  . RESTASIS 0.05 % ophthalmic emulsion   3  . triamterene-hydrochlorothiazide (MAXZIDE-25) 37.5-25 MG tablet Take 1 tablet by mouth daily. 90 tablet 3  . zolpidem (AMBIEN) 5 MG tablet Take 1 tablet (5 mg total)  by mouth at bedtime as needed. for sleep 90 tablet 0   No current facility-administered medications on file prior to visit.    Social History  Substance Use Topics  . Smoking status: Never Smoker   . Smokeless tobacco: None  . Alcohol Use: Yes     Comment: rare alcohol use    Review of Systems  Constitutional: Positive for fever (102, resolved) and chills.  Respiratory: Negative for cough.   Cardiovascular: Negative for chest pain and palpitations.  Gastrointestinal: Positive for abdominal pain, constipation (chronic; takes metamucil ) and flatus. Negative for  nausea, vomiting, diarrhea and abdominal distention.  Genitourinary: Negative for dysuria and frequency.       No low back or flank pain.       Objective:    BP 118/60 mmHg  Pulse 79  Temp(Src) 98.3 F (36.8 C) (Oral)  Ht 5' 4.5" (1.638 m)  Wt 130 lb (58.968 kg)  BMI 21.98 kg/m2  SpO2 98%   Physical Exam  Constitutional: She appears well-developed and well-nourished.  Eyes: Conjunctivae are normal.  Cardiovascular: Normal rate, regular rhythm, normal heart sounds and normal pulses.   Pulmonary/Chest: Effort normal and breath sounds normal. She has no wheezes. She has no rhonchi. She has no rales.  Abdominal: Soft. Normal appearance and bowel sounds are normal. She exhibits no distension, no fluid wave, no ascites and no mass. There is tenderness in the right lower quadrant and left lower quadrant. There is no rigidity, no rebound, no guarding, no CVA tenderness, no tenderness at McBurney's point and negative Murphy's sign.    Neurological: She is alert.  Skin: Skin is warm and dry.  Psychiatric: She has a normal mood and affect. Her speech is normal and behavior is normal. Thought content normal.  Vitals reviewed.      Assessment & Plan:   1. Lower abdominal pain Patient has generalized tenderness across lower abdomen. No focal tenderness, particularly left lower quadrant to suggest diverticulitis. No dysuria or low back pain to suggest UTI. Sue [name] is well-appearing and I am very reassured that today her abdominal pain has largely resolved. Her fever last night had also gone down from the night before. We jointly agreed that in the absence of an acute abdomen or worsening symptoms, we would maintain with close observation. If the pain recurs or her fever returns, patient will let us know, and we will order a stat CT abdomen and pelvis   I am having Sue [name] maintain her Chelated Potassium, GLUCOSAMINE HCL PO, Melatonin, Esterified Estrogens, multivitamin,  Desoximetasone, erythromycin base, lidocaine-hydrocortisone, RESTASIS, clotrimazole, ESTRACE VAGINAL, Phosphatidylserine-DHA-EPA, metoprolol succinate, calcium-vitamin D, LATISSE, diclofenac, triamterene-hydrochlorothiazide, ALPRAZolam, and zolpidem.   No orders of the defined types were placed in this encounter.     Start medications as prescribed and explained to patient on After Visit Summary ( AVS). Risks, benefits, and alternatives of the medications and treatment plan prescribed today were discussed, and patient expressed understanding.   Education regarding symptom management and diagnosis given to patient.   Follow-up:Plan follow-up and return precautions given if any worsening symptoms or change in condition.   Continue to follow with Walker Kehr, MD for routine health maintenance.   Sue [name] and I agreed with plan.   Mable Paris, FNP

## 2016-02-05 NOTE — Progress Notes (Signed)
Pre visit review using our clinic review tool, if applicable. No additional management support is needed unless otherwise documented below in the visit note. 

## 2016-02-05 NOTE — Patient Instructions (Signed)
Suspect viral gastritis. Possibly constipation.   Your last colonoscopy does show diverticulosis. If pain recurs, we will do a CT abdomen.   If there is no improvement in your symptoms, or if there is any worsening of symptoms, or if you have any additional concerns, please return for re-evaluation; or, if we are closed, consider going to the Emergency Room for evaluation if symptoms urgent.  Gastritis, Adult Gastritis is soreness and swelling (inflammation) of the lining of the stomach. Gastritis can develop as a sudden onset (acute) or long-term (chronic) condition. If gastritis is not treated, it can lead to stomach bleeding and ulcers. CAUSES  Gastritis occurs when the stomach lining is weak or damaged. Digestive juices from the stomach then inflame the weakened stomach lining. The stomach lining may be weak or damaged due to viral or bacterial infections. One common bacterial infection is the Helicobacter pylori infection. Gastritis can also result from excessive alcohol consumption, taking certain medicines, or having too much acid in the stomach.  SYMPTOMS  In some cases, there are no symptoms. When symptoms are present, they may include:  Pain or a burning sensation in the upper abdomen.  Nausea.  Vomiting.  An uncomfortable feeling of fullness after eating. DIAGNOSIS  Your caregiver may suspect you have gastritis based on your symptoms and a physical exam. To determine the cause of your gastritis, your caregiver may perform the following:  Blood or stool tests to check for the H pylori bacterium.  Gastroscopy. A thin, flexible tube (endoscope) is passed down the esophagus and into the stomach. The endoscope has a light and camera on the end. Your caregiver uses the endoscope to view the inside of the stomach.  Taking a tissue sample (biopsy) from the stomach to examine under a microscope. TREATMENT  Depending on the cause of your gastritis, medicines may be prescribed. If you  have a bacterial infection, such as an H pylori infection, antibiotics may be given. If your gastritis is caused by too much acid in the stomach, H2 blockers or antacids may be given. Your caregiver may recommend that you stop taking aspirin, ibuprofen, or other nonsteroidal anti-inflammatory drugs (NSAIDs). HOME CARE INSTRUCTIONS  Only take over-the-counter or prescription medicines as directed by your caregiver.  If you were given antibiotic medicines, take them as directed. Finish them even if you start to feel better.  Drink enough fluids to keep your urine clear or pale yellow.  Avoid foods and drinks that make your symptoms worse, such as:  Caffeine or alcoholic drinks.  Chocolate.  Peppermint or mint flavorings.  Garlic and onions.  Spicy foods.  Citrus fruits, such as oranges, lemons, or limes.  Tomato-based foods such as sauce, chili, salsa, and pizza.  Fried and fatty foods.  Eat small, frequent meals instead of large meals. SEEK IMMEDIATE MEDICAL CARE IF:   You have black or dark red stools.  You vomit blood or material that looks like coffee grounds.  You are unable to keep fluids down.  Your abdominal pain gets worse.  You have a fever.  You do not feel better after 1 week.  You have any other questions or concerns. MAKE SURE YOU:  Understand these instructions.  Will watch your condition.  Will get help right away if you are not doing well or get worse.   This information is not intended to replace advice given to you by your health care provider. Make sure you discuss any questions you have with your health care provider.  Document Released: 08/24/2001 Document Revised: 02/29/2012 Document Reviewed: 10/13/2011 Elsevier Interactive Patient Education [employer].

## 2016-02-10 ENCOUNTER — Telehealth: Payer: Self-pay | Admitting: Internal Medicine

## 2016-02-10 DIAGNOSIS — R1030 Lower abdominal pain, unspecified: Secondary | ICD-10-CM

## 2016-02-10 NOTE — Telephone Encounter (Signed)
Ordered.  Thx

## 2016-02-10 NOTE — Telephone Encounter (Signed)
Patient called in to advise that she continues to have stomach pain. She states that she was told that if it persisted through the holiday weekend, then we would order a CT scan. Please advise patient.

## 2016-02-11 ENCOUNTER — Encounter (HOSPITAL_COMMUNITY): Payer: Self-pay | Admitting: *Deleted

## 2016-02-11 ENCOUNTER — Emergency Department (HOSPITAL_COMMUNITY): Payer: Medicare Other | Admitting: Certified Registered Nurse Anesthetist

## 2016-02-11 ENCOUNTER — Ambulatory Visit (INDEPENDENT_AMBULATORY_CARE_PROVIDER_SITE_OTHER)
Admission: RE | Admit: 2016-02-11 | Discharge: 2016-02-11 | Disposition: A | Discharge status: Home / Self Care | Payer: Medicare Other | Source: Ambulatory Visit | Attending: Internal Medicine | Admitting: Internal Medicine

## 2016-02-11 ENCOUNTER — Telehealth: Payer: Self-pay | Admitting: Internal Medicine

## 2016-02-11 ENCOUNTER — Encounter (HOSPITAL_COMMUNITY): Admission: EM | Disposition: A | Discharge status: Home Health | Payer: Self-pay | Source: Home / Self Care

## 2016-02-11 ENCOUNTER — Inpatient Hospital Stay (HOSPITAL_COMMUNITY)
Admission: EM | Admit: 2016-02-11 | Discharge: 2016-02-18 | DRG: 330 | Disposition: A | Discharge status: Home Health | Payer: Medicare Other | Attending: Surgery | Admitting: Surgery

## 2016-02-11 DIAGNOSIS — E876 Hypokalemia: Secondary | ICD-10-CM | POA: Diagnosis not present

## 2016-02-11 DIAGNOSIS — D72829 Elevated white blood cell count, unspecified: Secondary | ICD-10-CM | POA: Diagnosis present

## 2016-02-11 DIAGNOSIS — D649 Anemia, unspecified: Secondary | ICD-10-CM | POA: Diagnosis not present

## 2016-02-11 DIAGNOSIS — K567 Ileus, unspecified: Secondary | ICD-10-CM | POA: Diagnosis not present

## 2016-02-11 DIAGNOSIS — R1030 Lower abdominal pain, unspecified: Secondary | ICD-10-CM

## 2016-02-11 DIAGNOSIS — I10 Essential (primary) hypertension: Secondary | ICD-10-CM | POA: Diagnosis not present

## 2016-02-11 DIAGNOSIS — K5732 Diverticulitis of large intestine without perforation or abscess without bleeding: Secondary | ICD-10-CM | POA: Diagnosis not present

## 2016-02-11 DIAGNOSIS — R109 Unspecified abdominal pain: Secondary | ICD-10-CM | POA: Diagnosis not present

## 2016-02-11 DIAGNOSIS — E875 Hyperkalemia: Secondary | ICD-10-CM | POA: Diagnosis not present

## 2016-02-11 DIAGNOSIS — K5730 Diverticulosis of large intestine without perforation or abscess without bleeding: Secondary | ICD-10-CM | POA: Diagnosis not present

## 2016-02-11 DIAGNOSIS — K5720 Diverticulitis of large intestine with perforation and abscess without bleeding: Principal | ICD-10-CM | POA: Diagnosis present

## 2016-02-11 DIAGNOSIS — Z79899 Other long term (current) drug therapy: Secondary | ICD-10-CM

## 2016-02-11 DIAGNOSIS — K5780 Diverticulitis of intestine, part unspecified, with perforation and abscess without bleeding: Secondary | ICD-10-CM | POA: Diagnosis not present

## 2016-02-11 DIAGNOSIS — K635 Polyp of colon: Secondary | ICD-10-CM | POA: Diagnosis not present

## 2016-02-11 HISTORY — PX: PARTIAL COLECTOMY: SHX5273

## 2016-02-11 LAB — HEPATIC FUNCTION PANEL
ALK PHOS: 158 U/L — AB (ref 38–126)
ALT: 76 U/L — AB (ref 14–54)
AST: 44 U/L — ABNORMAL HIGH (ref 15–41)
Albumin: 2.8 g/dL — ABNORMAL LOW (ref 3.5–5.0)
BILIRUBIN DIRECT: 0.2 mg/dL (ref 0.1–0.5)
BILIRUBIN INDIRECT: 0.2 mg/dL — AB (ref 0.3–0.9)
Total Bilirubin: 0.4 mg/dL (ref 0.3–1.2)
Total Protein: 7 g/dL (ref 6.5–8.1)

## 2016-02-11 LAB — CBC WITH DIFFERENTIAL/PLATELET
BASOS ABS: 0 10*3/uL (ref 0.0–0.1)
Basophils Relative: 0 %
EOS ABS: 0 10*3/uL (ref 0.0–0.7)
Eosinophils Relative: 0 %
HCT: 36.8 % (ref 36.0–46.0)
HEMOGLOBIN: 12.2 g/dL (ref 12.0–15.0)
LYMPHS PCT: 7 %
Lymphs Abs: 1.5 10*3/uL (ref 0.7–4.0)
MCH: 29.4 pg (ref 26.0–34.0)
MCHC: 33.2 g/dL (ref 30.0–36.0)
MCV: 88.7 fL (ref 78.0–100.0)
Monocytes Absolute: 1.2 10*3/uL — ABNORMAL HIGH (ref 0.1–1.0)
Monocytes Relative: 5 %
NEUTROS PCT: 88 %
Neutro Abs: 19.4 10*3/uL — ABNORMAL HIGH (ref 1.7–7.7)
PLATELETS: 453 10*3/uL — AB (ref 150–400)
RBC: 4.15 MIL/uL (ref 3.87–5.11)
RDW: 13.7 % (ref 11.5–15.5)
WBC: 22.1 10*3/uL — AB (ref 4.0–10.5)

## 2016-02-11 LAB — BASIC METABOLIC PANEL
ANION GAP: 13 (ref 5–15)
BUN: 10 mg/dL (ref 6–20)
CO2: 26 mmol/L (ref 22–32)
Calcium: 8.9 mg/dL (ref 8.9–10.3)
Chloride: 94 mmol/L — ABNORMAL LOW (ref 101–111)
Creatinine, Ser: 0.79 mg/dL (ref 0.44–1.00)
GFR calc Af Amer: >60 mL/min (ref >60)
Glucose, Bld: 132 mg/dL — ABNORMAL HIGH (ref 65–99)
POTASSIUM: 3.2 mmol/L — AB (ref 3.5–5.1)
SODIUM: 133 mmol/L — AB (ref 135–145)

## 2016-02-11 LAB — LIPASE, BLOOD: LIPASE: 28 U/L (ref 11–51)

## 2016-02-11 LAB — I-STAT CG4 LACTIC ACID, ED: LACTIC ACID, VENOUS: 1.16 mmol/L (ref 0.5–2.0)

## 2016-02-11 SURGERY — COLECTOMY, PARTIAL
Anesthesia: General | Site: Abdomen

## 2016-02-11 MED ORDER — DIPHENHYDRAMINE HCL 25 MG PO CAPS: 25.0000 mg | ORAL_CAPSULE | Freq: Four times a day (QID) | ORAL | Status: DC | PRN

## 2016-02-11 MED ORDER — SUCCINYLCHOLINE CHLORIDE 20 MG/ML IJ SOLN
INTRAMUSCULAR | Status: DC | PRN
  Administered 2016-02-11: 120 mg via INTRAVENOUS

## 2016-02-11 MED ORDER — MORPHINE SULFATE (PF) 2 MG/ML IV SOLN
2.0000 mg | INTRAVENOUS | Status: DC | PRN
  Administered 2016-02-11 – 2016-02-12 (×2): 4 mg via INTRAVENOUS
  Filled 2016-02-11 (×2): qty 2

## 2016-02-11 MED ORDER — SODIUM CHLORIDE 0.9 % IV BOLUS (SEPSIS)
1000.0000 mL | Freq: Once | INTRAVENOUS | Status: AC
  Administered 2016-02-11: 1000 mL via INTRAVENOUS

## 2016-02-11 MED ORDER — FENTANYL CITRATE (PF) 250 MCG/5ML IJ SOLN
INTRAMUSCULAR | Status: AC
  Filled 2016-02-11: qty 5

## 2016-02-11 MED ORDER — DEXTROSE 5 % IV SOLN
2.0000 g | Freq: Three times a day (TID) | INTRAVENOUS | Status: DC
  Administered 2016-02-11 – 2016-02-18 (×20): 2 g via INTRAVENOUS
  Filled 2016-02-11 (×24): qty 2

## 2016-02-11 MED ORDER — SUGAMMADEX SODIUM 200 MG/2ML IV SOLN
INTRAVENOUS | Status: DC | PRN
  Administered 2016-02-11: 200 mg via INTRAVENOUS

## 2016-02-11 MED ORDER — OXYCODONE HCL 5 MG PO TABS: 5.0000 mg | ORAL_TABLET | ORAL | Status: DC | PRN

## 2016-02-11 MED ORDER — PROPOFOL 10 MG/ML IV BOLUS
INTRAVENOUS | Status: AC
  Filled 2016-02-11: qty 20

## 2016-02-11 MED ORDER — ENOXAPARIN SODIUM 40 MG/0.4ML ~~LOC~~ SOLN
40.0000 mg | SUBCUTANEOUS | Status: DC
  Administered 2016-02-12 – 2016-02-18 (×7): 40 mg via SUBCUTANEOUS
  Filled 2016-02-11 (×7): qty 0.4

## 2016-02-11 MED ORDER — ROCURONIUM BROMIDE 100 MG/10ML IV SOLN
INTRAVENOUS | Status: DC | PRN
  Administered 2016-02-11: 30 mg via INTRAVENOUS

## 2016-02-11 MED ORDER — ONDANSETRON HCL 4 MG/2ML IJ SOLN
INTRAMUSCULAR | Status: DC | PRN
  Administered 2016-02-11: 4 mg via INTRAVENOUS

## 2016-02-11 MED ORDER — METRONIDAZOLE IN NACL 5-0.79 MG/ML-% IV SOLN
500.0000 mg | Freq: Once | INTRAVENOUS | Status: AC
  Administered 2016-02-11: 500 mg via INTRAVENOUS
  Filled 2016-02-11: qty 100

## 2016-02-11 MED ORDER — ACETAMINOPHEN 325 MG PO TABS
650.0000 mg | ORAL_TABLET | Freq: Four times a day (QID) | ORAL | Status: DC | PRN
  Administered 2016-02-18: 650 mg via ORAL
  Filled 2016-02-11 (×2): qty 2

## 2016-02-11 MED ORDER — METRONIDAZOLE IN NACL 5-0.79 MG/ML-% IV SOLN
500.0000 mg | Freq: Three times a day (TID) | INTRAVENOUS | Status: DC
  Administered 2016-02-12 – 2016-02-18 (×20): 500 mg via INTRAVENOUS
  Filled 2016-02-11 (×22): qty 100

## 2016-02-11 MED ORDER — ONDANSETRON HCL 4 MG/2ML IJ SOLN
4.0000 mg | Freq: Four times a day (QID) | INTRAMUSCULAR | Status: DC | PRN
  Administered 2016-02-15: 4 mg via INTRAVENOUS
  Filled 2016-02-11: qty 2

## 2016-02-11 MED ORDER — DEXTROSE 5 % IV SOLN
2.0000 g | Freq: Once | INTRAVENOUS | Status: AC
  Administered 2016-02-11: 2 g via INTRAVENOUS
  Filled 2016-02-11: qty 2

## 2016-02-11 MED ORDER — SUGAMMADEX SODIUM 200 MG/2ML IV SOLN
INTRAVENOUS | Status: AC
  Filled 2016-02-11: qty 2

## 2016-02-11 MED ORDER — ONDANSETRON 4 MG PO TBDP: 4.0000 mg | ORAL_TABLET | Freq: Four times a day (QID) | ORAL | Status: DC | PRN

## 2016-02-11 MED ORDER — MORPHINE SULFATE (PF) 4 MG/ML IV SOLN
4.0000 mg | INTRAVENOUS | Status: AC | PRN
Start: 2016-02-11 — End: 2016-02-12
  Administered 2016-02-11 – 2016-02-12 (×2): 4 mg via INTRAVENOUS
  Filled 2016-02-11 (×2): qty 1

## 2016-02-11 MED ORDER — 0.9 % SODIUM CHLORIDE (POUR BTL) OPTIME
TOPICAL | Status: DC | PRN
  Administered 2016-02-11 (×2): 1000 mL
  Administered 2016-02-11: 2000 mL

## 2016-02-11 MED ORDER — DIPHENHYDRAMINE HCL 50 MG/ML IJ SOLN: 25.0000 mg | Freq: Four times a day (QID) | INTRAMUSCULAR | Status: DC | PRN

## 2016-02-11 MED ORDER — ACETAMINOPHEN 650 MG RE SUPP: 650.0000 mg | Freq: Four times a day (QID) | RECTAL | Status: DC | PRN

## 2016-02-11 MED ORDER — LACTATED RINGERS IV SOLN
INTRAVENOUS | Status: DC | PRN
  Administered 2016-02-11: 18:00:00 via INTRAVENOUS

## 2016-02-11 MED ORDER — FENTANYL CITRATE (PF) 100 MCG/2ML IJ SOLN
INTRAMUSCULAR | Status: DC | PRN
  Administered 2016-02-11: 50 ug via INTRAVENOUS
  Administered 2016-02-11 (×2): 100 ug via INTRAVENOUS

## 2016-02-11 MED ORDER — ARTIFICIAL TEARS OP OINT
TOPICAL_OINTMENT | OPHTHALMIC | Status: DC | PRN
  Administered 2016-02-11: 1 via OPHTHALMIC

## 2016-02-11 MED ORDER — ONDANSETRON HCL 4 MG/2ML IJ SOLN
4.0000 mg | Freq: Once | INTRAMUSCULAR | Status: AC
  Administered 2016-02-11: 4 mg via INTRAVENOUS
  Filled 2016-02-11: qty 2

## 2016-02-11 MED ORDER — KCL IN DEXTROSE-NACL 20-5-0.45 MEQ/L-%-% IV SOLN
INTRAVENOUS | Status: DC
  Administered 2016-02-11 – 2016-02-16 (×5): via INTRAVENOUS
  Filled 2016-02-11 (×11): qty 1000

## 2016-02-11 MED ORDER — DEXAMETHASONE SODIUM PHOSPHATE 10 MG/ML IJ SOLN
INTRAMUSCULAR | Status: DC | PRN
  Administered 2016-02-11: 10 mg via INTRAVENOUS

## 2016-02-11 MED ORDER — ONDANSETRON HCL 4 MG/2ML IJ SOLN: 4.0000 mg | Freq: Once | INTRAMUSCULAR | Status: DC | PRN

## 2016-02-11 MED ORDER — LIDOCAINE HCL (CARDIAC) 20 MG/ML IV SOLN
INTRAVENOUS | Status: DC | PRN
  Administered 2016-02-11: 40 mg via INTRAVENOUS
  Administered 2016-02-11: 60 mg via INTRAVENOUS

## 2016-02-11 MED ORDER — HYDROMORPHONE HCL 1 MG/ML IJ SOLN
INTRAMUSCULAR | Status: AC
  Filled 2016-02-11: qty 1

## 2016-02-11 MED ORDER — IOPAMIDOL (ISOVUE-300) INJECTION 61%
100.0000 mL | Freq: Once | INTRAVENOUS | Status: AC | PRN
  Administered 2016-02-11: 100 mL via INTRAVENOUS

## 2016-02-11 MED ORDER — DEXAMETHASONE SODIUM PHOSPHATE 10 MG/ML IJ SOLN
INTRAMUSCULAR | Status: AC
  Filled 2016-02-11: qty 1

## 2016-02-11 MED ORDER — PROPOFOL 10 MG/ML IV BOLUS
INTRAVENOUS | Status: DC | PRN
  Administered 2016-02-11: 150 mg via INTRAVENOUS

## 2016-02-11 MED ORDER — HYDROMORPHONE HCL 1 MG/ML IJ SOLN
0.2500 mg | INTRAMUSCULAR | Status: DC | PRN
  Administered 2016-02-11 (×2): 0.5 mg via INTRAVENOUS

## 2016-02-11 SURGICAL SUPPLY — 58 items
BLADE SURG ROTATE 9660 (MISCELLANEOUS) IMPLANT
BNDG GAUZE ELAST 4 BULKY (GAUZE/BANDAGES/DRESSINGS) ×2 IMPLANT
CANISTER SUCTION 2500CC (MISCELLANEOUS) ×2 IMPLANT
CHLORAPREP W/TINT 26ML (MISCELLANEOUS) ×2 IMPLANT
COVER MAYO STAND STRL (DRAPES) ×2 IMPLANT
COVER SURGICAL LIGHT HANDLE (MISCELLANEOUS) ×4 IMPLANT
DRAIN CHANNEL 19F RND (DRAIN) ×2 IMPLANT
DRAPE LAPAROSCOPIC ABDOMINAL (DRAPES) ×2 IMPLANT
DRAPE PROXIMA HALF (DRAPES) ×2 IMPLANT
DRAPE UTILITY XL STRL (DRAPES) ×2 IMPLANT
DRAPE WARM FLUID 44X44 (DRAPE) ×2 IMPLANT
DRSG OPSITE POSTOP 4X10 (GAUZE/BANDAGES/DRESSINGS) IMPLANT
DRSG OPSITE POSTOP 4X8 (GAUZE/BANDAGES/DRESSINGS) IMPLANT
DRSG PAD ABDOMINAL 8X10 ST (GAUZE/BANDAGES/DRESSINGS) ×2 IMPLANT
ELECT BLADE 6.5 EXT (BLADE) ×2 IMPLANT
ELECT CAUTERY BLADE 6.4 (BLADE) ×4 IMPLANT
ELECT REM PT RETURN 9FT ADLT (ELECTROSURGICAL) ×2
ELECTRODE REM PT RTRN 9FT ADLT (ELECTROSURGICAL) ×1 IMPLANT
EVACUATOR SILICONE 100CC (DRAIN) ×2 IMPLANT
GAUZE SPONGE 4X4 12PLY STRL (GAUZE/BANDAGES/DRESSINGS) ×2 IMPLANT
GLOVE BIOGEL PI IND STRL 7.5 (GLOVE) ×4 IMPLANT
GLOVE BIOGEL PI INDICATOR 7.5 (GLOVE) ×4
GLOVE SURG SS PI 7.0 STRL IVOR (GLOVE) ×4 IMPLANT
GLOVE SURG SS PI 8.0 STRL IVOR (GLOVE) ×2 IMPLANT
GOWN STRL REUS W/ TWL LRG LVL3 (GOWN DISPOSABLE) ×6 IMPLANT
GOWN STRL REUS W/TWL LRG LVL3 (GOWN DISPOSABLE) ×6
KIT BASIN OR (CUSTOM PROCEDURE TRAY) ×2 IMPLANT
KIT ROOM TURNOVER OR (KITS) ×2 IMPLANT
LEGGING LITHOTOMY PAIR STRL (DRAPES) IMPLANT
LIGASURE IMPACT 36 18CM CVD LR (INSTRUMENTS) ×2 IMPLANT
NS IRRIG 1000ML POUR BTL (IV SOLUTION) ×4 IMPLANT
PACK GENERAL/GYN (CUSTOM PROCEDURE TRAY) ×2 IMPLANT
PAD ARMBOARD 7.5X6 YLW CONV (MISCELLANEOUS) ×2 IMPLANT
PENCIL BUTTON HOLSTER BLD 10FT (ELECTRODE) ×2 IMPLANT
RELOAD PROXIMATE 75MM BLUE (ENDOMECHANICALS) ×4 IMPLANT
SPONGE LAP 18X18 X RAY DECT (DISPOSABLE) IMPLANT
STAPLER PROXIMATE 75MM BLUE (STAPLE) ×2 IMPLANT
STAPLER VISISTAT 35W (STAPLE) ×2 IMPLANT
SUCTION POOLE TIP (SUCTIONS) ×2 IMPLANT
SURGILUBE 2OZ TUBE FLIPTOP (MISCELLANEOUS) IMPLANT
SUT ETHILON 3 0 FSL (SUTURE) ×2 IMPLANT
SUT PDS AB 1 TP1 96 (SUTURE) ×4 IMPLANT
SUT PROLENE 2 0 CT2 30 (SUTURE) IMPLANT
SUT PROLENE 2 0 KS (SUTURE) IMPLANT
SUT SILK 2 0 SH CR/8 (SUTURE) ×2 IMPLANT
SUT SILK 2 0 TIES 10X30 (SUTURE) ×2 IMPLANT
SUT SILK 3 0 SH CR/8 (SUTURE) ×2 IMPLANT
SUT SILK 3 0 TIES 10X30 (SUTURE) ×2 IMPLANT
SUT VIC AB 3-0 SH 18 (SUTURE) IMPLANT
SUT VIC AB 3-0 SH 8-18 (SUTURE) ×2 IMPLANT
SYR BULB IRRIGATION 50ML (SYRINGE) ×2 IMPLANT
TAPE CLOTH SURG 6X10 WHT LF (GAUZE/BANDAGES/DRESSINGS) ×2 IMPLANT
TOWEL OR 17X26 10 PK STRL BLUE (TOWEL DISPOSABLE) ×4 IMPLANT
TRAY FOLEY CATH 16FRSI W/METER (SET/KITS/TRAYS/PACK) IMPLANT
TRAY FOLEY CATH SILVER 16FR LF (SET/KITS/TRAYS/PACK) ×2 IMPLANT
TRAY PROCTOSCOPIC FIBER OPTIC (SET/KITS/TRAYS/PACK) IMPLANT
TUBE CONNECTING 12X1/4 (SUCTIONS) ×2 IMPLANT
YANKAUER SUCT BULB TIP NO VENT (SUCTIONS) ×2 IMPLANT

## 2016-02-11 NOTE — Anesthesia Postprocedure Evaluation (Incomplete)
Anesthesia Post Note  Patient: Sue [name]  Procedure(s) Performed: Procedure(s) (LRB): PARTIAL COLECTOMY with colostomy (N/A)  Anesthesia Post Evaluation  Last Vitals:  Filed Vitals:   02/11/16 1800 02/11/16 2104  BP: 176/92   Pulse: 90   Temp:  36.3 C  Resp:      Last Pain:  Filed Vitals:   02/11/16 2105  PainSc: 5                  Sanae Willetts A

## 2016-02-11 NOTE — H&P (Signed)
Sue [name] is an 65 y.o. female.   Chief Complaint: abdominal pain HPI: 65 yo female with 3 week history of intermittent abdominal pain. The pain began in the epigastrium, led to anorexia and some nausea. It was thought to be gastroenteritis however failed to improve. 5 days ago she began having much worse abdominal pain now in the lower abdomen, had a fever of 102, constipation, nausea. The pain is constant, not radiating, worse with any movement. She has had no previous pains similar to this.  Past Medical History  Diagnosis Date  . FRACTURE, ARM, LEFT 11/15/2007  . FRACTURE, FOOT 11/15/2007  . FRACTURE, INDEX FINGER, LEFT 11/15/2007  . HYPERTENSION 11/15/2007  . INSOMNIA, CHRONIC 06/17/2009  . MITRAL VALVE PROLAPSE, HX OF 11/15/2007  . OSTEOARTHRITIS 11/15/2007  . RIB FRACTURE 11/15/2007  . Scarlet fever 11/15/2007  . TONSILLECTOMY, HX OF 11/15/2007  . Subdural hematoma [facility])     Past Surgical History  Procedure Laterality Date  . Tonsillectomy      Family History  Problem Relation Age of Onset  . Parkinsonism Other   . Cancer Mother   . Hypertension Father   . Cancer Father    Social History:  reports that she has never smoked. She does not have any smokeless tobacco history on file. She reports that she drinks alcohol. She reports that she does not use illicit drugs.  Allergies:  Allergies  Allergen Reactions  . Clindamycin Anaphylaxis  . Latex     REACTION: sores in mouth  . Penicillins      (Not in a hospital admission)  Results for orders placed or performed during the hospital encounter of 02/11/16 (from the past 48 hour(s))  CBC with Differential     Status: Abnormal   Collection Time: 02/11/16  4:39 PM  Result Value Ref Range   WBC 22.1 (H) 4.0 - 10.5 K/uL   RBC 4.15 3.87 - 5.11 MIL/uL   Hemoglobin 12.2 12.0 - 15.0 g/dL   HCT 36.8 36.0 - 46.0 %   MCV 88.7 78.0 - 100.0 fL   MCH 29.4 26.0 - 34.0 pg   MCHC 33.2 30.0 - 36.0 g/dL   RDW 13.7 11.5 - 15.5 %    Platelets 453 (H) 150 - 400 K/uL   Neutrophils Relative % 88 %   Neutro Abs 19.4 (H) 1.7 - 7.7 K/uL   Lymphocytes Relative 7 %   Lymphs Abs 1.5 0.7 - 4.0 K/uL   Monocytes Relative 5 %   Monocytes Absolute 1.2 (H) 0.1 - 1.0 K/uL   Eosinophils Relative 0 %   Eosinophils Absolute 0.0 0.0 - 0.7 K/uL   Basophils Relative 0 %   Basophils Absolute 0.0 0.0 - 0.1 K/uL  Basic metabolic panel     Status: Abnormal   Collection Time: 02/11/16  4:39 PM  Result Value Ref Range   Sodium 133 (L) 135 - 145 mmol/L   Potassium 3.2 (L) 3.5 - 5.1 mmol/L   Chloride 94 (L) 101 - 111 mmol/L   CO2 26 22 - 32 mmol/L   Glucose, Bld 132 (H) 65 - 99 mg/dL   BUN 10 6 - 20 mg/dL   Creatinine, Ser 0.79 0.44 - 1.00 mg/dL   Calcium 8.9 8.9 - 10.3 mg/dL   GFR calc non Af Amer >60 >60 mL/min   GFR calc Af Amer >60 >60 mL/min    Comment: (NOTE) The eGFR has been calculated using the CKD EPI equation. This calculation has not been  validated in all clinical situations. eGFR's persistently <60 mL/min signify possible Chronic Kidney Disease.    Anion gap 13 5 - 15  Lipase, blood     Status: None   Collection Time: 02/11/16  4:39 PM  Result Value Ref Range   Lipase 28 11 - 51 U/L  Hepatic function panel     Status: Abnormal   Collection Time: 02/11/16  4:39 PM  Result Value Ref Range   Total Protein 7.0 6.5 - 8.1 g/dL   Albumin 2.8 (L) 3.5 - 5.0 g/dL   AST 44 (H) 15 - 41 U/L   ALT 76 (H) 14 - 54 U/L   Alkaline Phosphatase 158 (H) 38 - 126 U/L   Total Bilirubin 0.4 0.3 - 1.2 mg/dL   Bilirubin, Direct 0.2 0.1 - 0.5 mg/dL   Indirect Bilirubin 0.2 (L) 0.3 - 0.9 mg/dL  I-Stat CG4 Lactic Acid, ED     Status: None   Collection Time: 02/11/16  4:54 PM  Result Value Ref Range   Lactic Acid, Venous 1.16 0.5 - 2.0 mmol/L   Ct Abdomen Pelvis W Contrast  02/11/2016  CLINICAL DATA:  Lower abdominal pain for several weeks. EXAM: CT ABDOMEN AND PELVIS WITH CONTRAST TECHNIQUE: Multidetector CT imaging of the abdomen and  pelvis was performed using the standard protocol following bolus administration of intravenous contrast. CONTRAST:  148m ISOVUE-300 IOPAMIDOL (ISOVUE-300) INJECTION 61% COMPARISON:  None. FINDINGS: Moderate degenerative disc disease is noted at L4-5. Visualized lung bases are unremarkable. No gallstones are noted. Small hepatic cysts are noted. The spleen and pancreas appear normal. Adrenal glands and kidneys appear normal. No hydronephrosis or renal obstruction is noted. Atherosclerosis of abdominal aorta is noted without aneurysm formation. Urinary bladder is decompressed. Uterus and left ovary appear normal. 3.9 cm right ovarian cyst is noted. Severe sigmoid diverticulitis is noted with 7.7 x 3.4 cm abscess in the pre rectal space of the pelvis. Large area of free gas is noted in the right pelvis which communicates with the abscess. Pneumoperitoneum is also noted in the epigastric region, consistent with rupture of hollow viscus. Mildly dilated small bowel loops are noted which most likely is inflammatory in etiology. IMPRESSION: Atherosclerosis of abdominal aorta without aneurysm formation. 3.9 cm right ovarian cyst is noted. Pelvic ultrasound is recommended for further evaluation. Severe sigmoid diverticulitis is noted with large abscess in the pre rectal space of the pelvis. Large area of pneumoperitoneum is noted in right pelvis which communicates with abscess, and mild pneumoperitoneum is noted in epigastric region is well. This is consistent with rupture of hollow viscus. Critical Value/emergent results were called by telephone at the time of interpretation on 02/11/2016 at 3:50 pm to Dr. ALew Dawes, who verbally acknowledged these results. Electronically Signed   By: JMarijo Conception M.D.   On: 02/11/2016 15:50    Review of Systems  Constitutional: Positive for weight loss. Negative for fever and chills.  HENT: Negative for hearing loss.   Eyes: Negative for blurred vision and double vision.   Respiratory: Negative for cough and hemoptysis.   Cardiovascular: Negative for chest pain and palpitations.  Gastrointestinal: Positive for nausea and abdominal pain. Negative for vomiting.  Genitourinary: Negative for dysuria and urgency.  Musculoskeletal: Negative for myalgias and neck pain.  Skin: Negative for itching and rash.  Neurological: Negative for dizziness, tingling and headaches.  Endo/Heme/Allergies: Does not bruise/bleed easily.  Psychiatric/Behavioral: Negative for depression and suicidal ideas.    Blood pressure 176/92, pulse 90, temperature  99.4 F (37.4 C), temperature source Oral, resp. rate 18, height 5' 4"  (1.626 m), weight 57.153 kg (126 lb), SpO2 98 %. Physical Exam  Vitals reviewed. Constitutional: She is oriented to person, place, and time. She appears well-developed and well-nourished.  HENT:  Head: Normocephalic and atraumatic.  Eyes: Conjunctivae and EOM are normal. Pupils are equal, round, and reactive to light.  Neck: Normal range of motion. Neck supple.  Cardiovascular: Normal rate and regular rhythm.   Respiratory: Effort normal and breath sounds normal.  GI: Soft. Bowel sounds are normal. She exhibits no distension. There is tenderness in the left lower quadrant. There is guarding.  Musculoskeletal: Normal range of motion.  Neurological: She is alert and oriented to person, place, and time.  Skin: Skin is warm and dry.  Psychiatric: She has a normal mood and affect. Her behavior is normal.     Assessment/Plan 65 yo female with perforated diverticulitis. Based on her exam, labs, and CT findings discussed options and I recommended proceeding with emergent partial colectomy with end colostomy. We discussed risks of infection, bleeding, intraabdominal organ injury, hernia formation. We discussed at length the colostomy and the plan for reversal around 3 months as long as no other complications make that issue more difficult. She showed good understanding  and wanted to proceed. -partial colectomy -IV abx -foley placement -expected LOS 7 days  Mickeal Skinner, MD 02/11/2016, 6:20 PM

## 2016-02-11 NOTE — ED Notes (Signed)
pts last food/drink was 0800 beside CT contrast at 1400.

## 2016-02-11 NOTE — Anesthesia Procedure Notes (Signed)
Procedure Name: Intubation Date/Time: 02/11/2016 7:21 PM Performed by: Jacquiline Doe A Pre-anesthesia Checklist: Patient identified, Emergency Drugs available, Suction available, Patient being monitored and Timeout performed Patient Re-evaluated:Patient Re-evaluated prior to inductionOxygen Delivery Method: Circle system utilized Preoxygenation: Pre-oxygenation with 100% oxygen Intubation Type: IV induction, Cricoid Pressure applied and Rapid sequence Laryngoscope Size: Mac and 4 Grade View: Grade I Tube type: Oral Tube size: 7.0 mm Number of attempts: 1 Airway Equipment and Method: Stylet Placement Confirmation: ETT inserted through vocal cords under direct vision,  positive ETCO2 and breath sounds checked- equal and bilateral Secured at: 21 cm Tube secured with: Tape Dental Injury: Teeth and Oropharynx as per pre-operative assessment

## 2016-02-11 NOTE — Op Note (Signed)
Preoperative diagnosis: perforated diverticulitis  Postoperative diagnosis: same   Procedure: Hartman's Procedure Surgeon: Gurney Maxin, M.D.  Asst: Serita Grammes, M.D.  Anesthesia: General  Indications for procedure: Sue [name] is a 65 y.o. year old female with symptoms of abdominal pain for 3 weeks worsening over the last 5 days area on further workup she was found to have perforated diverticulitis multiple abscesses of free air. After discussing the patient versus alternatives benefits she was consented for partial colectomy with end colostomy and brought through surgery.  Description of procedure: The patient was brought into the operative suite. Anesthesia was administered with General endotracheal anesthesia. WHO checklist was applied. The patient was then placed in supine. The area was prepped and draped in the usual sterile fashion.  Next a lower midline incision was made and cautery was used to dissect down to its obtains tissues and fascia. Further blunt dissection allowed [place] to enter into the peritoneal cavity. First evaluation the sigmoid colon was very inflamed throughout and there was a abscess drained through blunt dissection in the pelvis. Multiple loops of small bowel were adhered up into the inflammatory process of the sigmoid colon. These were removed with blunt dissection. The proximal portion sigmoid colon and minimal disease and there is no further diverticula proximal to this area therefore we created a window through the mesentery and used a 75 mm GIA to transect sigmoid colon this position. Next reason LigaSure device to ligate and transect the mesentery moving distally. We stayed directly next to the colon to avoid injury to any retroperitoneal structures. In doing this we came across the superior rectal vessels which we took with 3-0 silk stick ties. This allowed [place] to reach the mesentery aspect of the rectosigmoid junction. We then placed a green load contour  stapler and transected at the rectosigmoid junction as far below as we could to get below the inflammatory tissue. On further examination of the specimen there multiple areas of full-thickness perforation largest of which was approximately 1 cm on the sigmoid aspect. There was still some inflammation of the tissues transected. Therefore placed a 39 [place] Blake drain in this area coming out the right lower quadrant, sutured in place with a 3-0 nylon. The knee irrigated out the abdomen with 2 L of warm saline. Ran the bowel from the ileocecal valve back to the ligament of Treitz there multiple areas of inflammation again been no full-thickness perforation or injury of small bowel. Next a circular incision was made to the left of the umbilicus cautery was used to dissect down through subcutaneous anus tissues cruciate incision was made in the anterior rectus sheath and then a second cruciate incision was made in the posterior rectus sheath and the distal end of the colon was placed through this. In main incision was then closed with a 0 PDS in running fashion with 1 cm advancement between suture. The staple line was then removed from the end of the sigmoid colon and the colostomy was matured with 3-0 Vicryl sutures suturing the end of the colon approximately 5 cm proximal on the colon and into the subcutaneous changes tissue in the superior left right and inferior aspects of the circular incision. These were tied in place and then additional sutures were used to approximate the colonic and 2 saphenous tissue. Osteophytes put in place. Skin was left open and packed with saline and a Kerlix. All counts were correct. Patient tolerated the procedure well.  Findings: Refractory diverticulitis with abscess.  Specimen: Sigmoid  colon  Implant: 35 [place] Blake drain overlying the rectal stump  Blood loss: 200 mL  Local anesthesia:   Complications: none  Gurney Maxin, M.D. General, Bariatric, & Minimally  Invasive Surgery [facility] [place]

## 2016-02-11 NOTE — Transfer of Care (Signed)
Immediate Anesthesia Transfer of Care Note  Patient: Sue [name]  Procedure(s) Performed: Procedure(s): PARTIAL COLECTOMY with colostomy (N/A)  Patient Location: PACU  Anesthesia Type:General  Level of Consciousness: awake, oriented, sedated, patient cooperative and responds to stimulation  Airway & Oxygen Therapy: Patient Spontanous Breathing  Post-op Assessment: Report given to RN and Post -op Vital signs reviewed and stable  Post vital signs: Reviewed and stable  Last Vitals:  Filed Vitals:   02/11/16 1745 02/11/16 1800  BP: 183/91 176/92  Pulse: 91 90  Temp:    Resp:      Last Pain:  Filed Vitals:   02/11/16 1809  PainSc: 5          Complications: No apparent anesthesia complications

## 2016-02-11 NOTE — Telephone Encounter (Signed)
Pt informed

## 2016-02-11 NOTE — Anesthesia Preprocedure Evaluation (Signed)
Anesthesia Evaluation  Patient identified by MRN, date of birth, ID band Patient awake    Reviewed: Allergy & Precautions, NPO status , Patient's Chart, lab work & pertinent test results  Airway Mallampati: II  TM Distance: >3 FB Neck ROM: Full    Dental  (+) Teeth Intact, Dental Advisory Given   Pulmonary    breath sounds clear to auscultation       Cardiovascular hypertension,  Rhythm:Regular     Neuro/Psych    GI/Hepatic   Endo/Other    Renal/GU      Musculoskeletal   Abdominal   Peds  Hematology   Anesthesia Other Findings   Reproductive/Obstetrics                             Anesthesia Physical Anesthesia Plan  ASA: IV and emergent  Anesthesia Plan: General   Post-op Pain Management:    Induction: Intravenous, Cricoid pressure planned and Rapid sequence  Airway Management Planned: Oral ETT  Additional Equipment:   Intra-op Plan:   Post-operative Plan: Extubation in OR  Informed Consent: I have reviewed the patients History and Physical, chart, labs and discussed the procedure including the risks, benefits and alternatives for the proposed anesthesia with the patient or authorized representative who has indicated his/her understanding and acceptance.   Dental advisory given  Plan Discussed with: CRNA and Anesthesiologist  Anesthesia Plan Comments:         Anesthesia Quick Evaluation

## 2016-02-11 NOTE — ED Notes (Signed)
MD at bedside. 

## 2016-02-11 NOTE — ED Notes (Signed)
Pt arrives to ED. PCP sent pt to get a CT scan after c/o abd pain x2 weeks. After CT results came in pt was instructed to come to ED. CT results in epic.

## 2016-02-11 NOTE — Telephone Encounter (Signed)
Spoke w/pt of CT report (per dr Nyoka Cowden). Pt's friend will take her to ER at [facility] now. We are calling [facility] triage - surg consult STAT. Spoke w/Vickie

## 2016-02-11 NOTE — ED Provider Notes (Signed)
CSN: [id number]     Arrival date & time 02/11/16  1619 History   First MD Initiated Contact with Patient 02/11/16 1630     Chief Complaint  Patient presents with  . Abdominal Pain     (Consider location/radiation/quality/duration/timing/severity/associated sxs/prior Treatment) HPI 65 year old female with history of hypertension and mitral valve prolapse who presents with abdominal pain. States that symptoms began about 2 weeks ago when she was out of town and developed abdominal bloating. She saw her primary care doctor when she returned and had blood work that was unremarkable. States that the bloating improved, but the next week developed severe low abdominal cramping worsened especially over the past several days. She has had associated fever of 102-103 Fahrenheit with nausea but no vomiting. Has felt very constipated during this time and denies any diarrhea. Denies any urinary complaints. No prior abdominal surgeries.  She was scheduled for outpatient CT scan by her primary care doctor today with concerning findings of severe diverticulitis with perforation, abscess, and pneumoperitoneum. She was sent to the ED from radiology. Past Medical History  Diagnosis Date  . FRACTURE, ARM, LEFT 11/15/2007  . FRACTURE, FOOT 11/15/2007  . FRACTURE, INDEX FINGER, LEFT 11/15/2007  . HYPERTENSION 11/15/2007  . INSOMNIA, CHRONIC 06/17/2009  . MITRAL VALVE PROLAPSE, HX OF 11/15/2007  . OSTEOARTHRITIS 11/15/2007  . RIB FRACTURE 11/15/2007  . Scarlet fever 11/15/2007  . TONSILLECTOMY, HX OF 11/15/2007  . Subdural hematoma [facility])    Past Surgical History  Procedure Laterality Date  . Tonsillectomy     Family History  Problem Relation Age of Onset  . Parkinsonism Other   . Cancer Mother   . Hypertension Father   . Cancer Father    Social History  Substance Use Topics  . Smoking status: Never Smoker   . Smokeless tobacco: None  . Alcohol Use: Yes     Comment: rare alcohol use   OB History    No data  available     Review of Systems 10/14 systems reviewed and are negative other than those stated in the HPI   Allergies  Clindamycin; Latex; and Penicillins  Home Medications   Prior to Admission medications   Medication Sig Start Date End Date Taking? Authorizing Provider  ALPRAZolam (XANAX) 0.25 MG tablet TAKE 1 TABLET BY MOUTH TWICE A DAY AS NEEDED FOR ANXIETY 08/22/15   Aleksei Plotnikov V, MD  calcium-vitamin D ([employer] OYSTER SHELL CALCIUM-VIT D) 500-200 MG-UNIT tablet Take 2 tablets by mouth daily.    Historical Provider, MD  Chelated Potassium 99 MG TABS Take by mouth as needed.      Historical Provider, MD  clotrimazole [facility]) 10 MG troche  04/29/14   Historical Provider, MD  Desoximetasone (TOPICORT) 0.25 % ointment Apply bid for aphtous ulcer 07/20/11   Neena Rhymes, MD  diclofenac (VOLTAREN) 75 MG EC tablet Take 1 tablet (75 mg total) by mouth 2 (two) times daily as needed. 08/22/15   Cassandria Anger, MD  erythromycin base (E-MYCIN) 500 MG tablet  06/28/14   Historical Provider, MD  Esterified Estrogens (MENEST) 0.3 MG tablet take 1/2 tablet my mouth every other day     Historical Provider, MD  ESTRACE VAGINAL 0.1 MG/GM vaginal cream  05/20/15   Historical Provider, MD  GLUCOSAMINE HCL PO Take by mouth daily.      Historical Provider, MD  LATISSE 0.03 % ophthalmic solution as needed. 08/18/15   Historical Provider, MD  lidocaine-hydrocortisone [facility] HC) 3-0.5 % CREA  05/03/14  Historical Provider, MD  Melatonin 5 MG CAPS Take by mouth as needed.      Historical Provider, MD  metoprolol succinate (TOPROL-XL) 25 MG 24 hr tablet TAKE 1 TABLET BY MOUTH EVERY DAY 07/07/15   Aleksei Plotnikov V, MD  Multiple Vitamin (MULTIVITAMIN) capsule Take 1 capsule by mouth daily.      Historical Provider, MD  Phosphatidylserine-DHA-EPA (VAYACOG) 100-19.5-6.5 MG CAPS Take 1 capsule by mouth daily. 05/22/15   Aleksei Plotnikov V, MD  RESTASIS 0.05 % ophthalmic emulsion  05/02/14   Historical  Provider, MD  triamterene-hydrochlorothiazide (MAXZIDE-25) 37.5-25 MG tablet Take 1 tablet by mouth daily. 08/22/15   Aleksei Plotnikov V, MD  zolpidem (AMBIEN) 5 MG tablet Take 1 tablet (5 mg total) by mouth at bedtime as needed. for sleep 08/22/15   Aleksei Plotnikov V, MD   BP 176/92 mmHg  Pulse 90  Temp(Src) 99.4 F (37.4 C) (Oral)  Resp 18  Ht 5\' 4"  (1.626 m)  Wt 126 lb (57.153 kg)  BMI 21.62 kg/m2  SpO2 98% Physical Exam Physical Exam  Nursing note and vitals reviewed. Constitutional: Well developed, well nourished, non-toxic, and in no acute distress Head: Normocephalic and atraumatic.  Mouth/Throat: Oropharynx is clear and moist.  Neck: Normal range of motion. Neck supple.  Cardiovascular: Normal rate and regular rhythm.   Pulmonary/Chest: Effort normal and breath sounds normal.  Abdominal: Mild distension. With diffuse tenderness, rebound and guarding. Worst in low abdomen bilaterally Musculoskeletal: Normal range of motion.  Neurological: Alert, no facial droop, fluent speech, moves all extremities symmetrically Skin: Skin is warm and dry.  Psychiatric: Cooperative  ED Course  Procedures (including critical care time) Labs Review Labs Reviewed  CBC WITH DIFFERENTIAL/PLATELET - Abnormal; Notable for the following:    WBC 22.1 (*)    Platelets 453 (*)    Neutro Abs 19.4 (*)    Monocytes Absolute 1.2 (*)    All other components within normal limits  BASIC METABOLIC PANEL - Abnormal; Notable for the following:    Sodium 133 (*)    Potassium 3.2 (*)    Chloride 94 (*)    Glucose, Bld 132 (*)    All other components within normal limits  HEPATIC FUNCTION PANEL - Abnormal; Notable for the following:    Albumin 2.8 (*)    AST 44 (*)    ALT 76 (*)    Alkaline Phosphatase 158 (*)    Indirect Bilirubin 0.2 (*)    All other components within normal limits  LIPASE, BLOOD  I-STAT CG4 LACTIC ACID, ED  I-STAT CG4 LACTIC ACID, ED  SURGICAL PATHOLOGY    Imaging  Review Ct Abdomen Pelvis W Contrast  02/11/2016  CLINICAL DATA:  Lower abdominal pain for several weeks. EXAM: CT ABDOMEN AND PELVIS WITH CONTRAST TECHNIQUE: Multidetector CT imaging of the abdomen and pelvis was performed using the standard protocol following bolus administration of intravenous contrast. CONTRAST:  141mL ISOVUE-300 IOPAMIDOL (ISOVUE-300) INJECTION 61% COMPARISON:  None. FINDINGS: Moderate degenerative disc disease is noted at L4-5. Visualized lung bases are unremarkable. No gallstones are noted. Small hepatic cysts are noted. The spleen and pancreas appear normal. Adrenal glands and kidneys appear normal. No hydronephrosis or renal obstruction is noted. Atherosclerosis of abdominal aorta is noted without aneurysm formation. Urinary bladder is decompressed. Uterus and left ovary appear normal. 3.9 cm right ovarian cyst is noted. Severe sigmoid diverticulitis is noted with 7.7 x 3.4 cm abscess in the pre rectal space of the pelvis. Large area of free  gas is noted in the right pelvis which communicates with the abscess. Pneumoperitoneum is also noted in the epigastric region, consistent with rupture of hollow viscus. Mildly dilated small bowel loops are noted which most likely is inflammatory in etiology. IMPRESSION: Atherosclerosis of abdominal aorta without aneurysm formation. 3.9 cm right ovarian cyst is noted. Pelvic ultrasound is recommended for further evaluation. Severe sigmoid diverticulitis is noted with large abscess in the pre rectal space of the pelvis. Large area of pneumoperitoneum is noted in right pelvis which communicates with abscess, and mild pneumoperitoneum is noted in epigastric region is well. This is consistent with rupture of hollow viscus. Critical Value/emergent results were called by telephone at the time of interpretation on 02/11/2016 at 3:50 pm to Dr. Lew Dawes , who verbally acknowledged these results. Electronically Signed   By: Marijo Conception, M.D.   On:  02/11/2016 15:50   I have personally reviewed and evaluated these images and lab results as part of my medical decision-making.   EKG Interpretation None      CRITICAL CARE Performed by: Forde Dandy   Total critical care time: 35 minutes  Critical care time was exclusive of separately billable procedures and treating other patients.  Critical care was necessary to treat or prevent imminent or life-threatening deterioration.  Critical care was time spent personally by me on the following activities: development of treatment plan with patient and/or surrogate as well as nursing, discussions with consultants, evaluation of patient's response to treatment, examination of patient, obtaining history from patient or surrogate, ordering and performing treatments and interventions, ordering and review of laboratory studies, ordering and review of radiographic studies, pulse oximetry and re-evaluation of patient's condition.   MDM   Final diagnoses:  Diverticulitis of large intestine with perforation and abscess without bleeding    CT visualized, and with Severe sigmoid diverticulitis with large abscess in the pre-rectal space and large area of pneumoperitoneum which communicates with this abscess. Concern for ruptured hollow viscus.  She is nontoxic in no acute distress. Hemodynamically stable. Normal lactate with leukocytosis of 22. I made nothing by mouth, started on IV fluids, given analgesics, antibiotics, and cefepime and Flagyl. Surgery consulted. Dr Will to admit for operative management.    Forde Dandy, MD 02/11/16 2011

## 2016-02-12 ENCOUNTER — Encounter (HOSPITAL_COMMUNITY): Payer: Self-pay | Admitting: General Surgery

## 2016-02-12 LAB — BASIC METABOLIC PANEL
ANION GAP: 10 (ref 5–15)
BUN: 10 mg/dL (ref 6–20)
CALCIUM: 8.2 mg/dL — AB (ref 8.9–10.3)
CO2: 25 mmol/L (ref 22–32)
CREATININE: 0.74 mg/dL (ref 0.44–1.00)
Chloride: 100 mmol/L — ABNORMAL LOW (ref 101–111)
Glucose, Bld: 184 mg/dL — ABNORMAL HIGH (ref 65–99)
Potassium: 3.8 mmol/L (ref 3.5–5.1)
SODIUM: 135 mmol/L (ref 135–145)

## 2016-02-12 LAB — CBC
HCT: 38.2 % (ref 36.0–46.0)
Hemoglobin: 12.7 g/dL (ref 12.0–15.0)
MCH: 30.2 pg (ref 26.0–34.0)
MCHC: 33.2 g/dL (ref 30.0–36.0)
MCV: 90.7 fL (ref 78.0–100.0)
PLATELETS: 477 10*3/uL — AB (ref 150–400)
RBC: 4.21 MIL/uL (ref 3.87–5.11)
RDW: 14.1 % (ref 11.5–15.5)
WBC: 29.7 10*3/uL — AB (ref 4.0–10.5)

## 2016-02-12 MED ORDER — DIPHENHYDRAMINE HCL 50 MG/ML IJ SOLN: 12.5000 mg | Freq: Four times a day (QID) | INTRAMUSCULAR | Status: DC | PRN

## 2016-02-12 MED ORDER — SODIUM CHLORIDE 0.9% FLUSH: 9.0000 mL | INTRAVENOUS | Status: DC | PRN

## 2016-02-12 MED ORDER — PHENOL 1.4 % MT LIQD
1.0000 | OROMUCOSAL | Status: DC | PRN
  Administered 2016-02-12: 1 via OROMUCOSAL
  Filled 2016-02-12: qty 177

## 2016-02-12 MED ORDER — ONDANSETRON HCL 4 MG/2ML IJ SOLN: 4.0000 mg | Freq: Four times a day (QID) | INTRAMUSCULAR | Status: DC | PRN

## 2016-02-12 MED ORDER — HYDRALAZINE HCL 20 MG/ML IJ SOLN: 10.0000 mg | Freq: Four times a day (QID) | INTRAMUSCULAR | Status: DC | PRN

## 2016-02-12 MED ORDER — METOPROLOL TARTRATE 5 MG/5ML IV SOLN: 2.5000 mg | Freq: Four times a day (QID) | INTRAVENOUS | Status: DC

## 2016-02-12 MED ORDER — DIPHENHYDRAMINE HCL 12.5 MG/5ML PO ELIX: 12.5000 mg | ORAL_SOLUTION | Freq: Four times a day (QID) | ORAL | Status: DC | PRN

## 2016-02-12 MED ORDER — NALOXONE HCL 0.4 MG/ML IJ SOLN: 0.4000 mg | INTRAMUSCULAR | Status: DC | PRN

## 2016-02-12 MED ORDER — METOPROLOL TARTRATE 5 MG/5ML IV SOLN
2.5000 mg | Freq: Four times a day (QID) | INTRAVENOUS | Status: DC
  Administered 2016-02-12: 2.5 mg via INTRAVENOUS
  Filled 2016-02-12: qty 5

## 2016-02-12 MED ORDER — METOPROLOL TARTRATE 5 MG/5ML IV SOLN
5.0000 mg | Freq: Four times a day (QID) | INTRAVENOUS | Status: DC
  Administered 2016-02-12 – 2016-02-14 (×7): 5 mg via INTRAVENOUS
  Filled 2016-02-12 (×7): qty 5

## 2016-02-12 MED ORDER — MORPHINE SULFATE 2 MG/ML IV SOLN
INTRAVENOUS | Status: DC
  Administered 2016-02-12: 13:00:00 via INTRAVENOUS
  Administered 2016-02-12: 1.5 mg via INTRAVENOUS
  Administered 2016-02-12: 3 mg via INTRAVENOUS
  Administered 2016-02-13: 0.3 mg via INTRAVENOUS
  Administered 2016-02-13: 3 mg via INTRAVENOUS
  Administered 2016-02-13 – 2016-02-14 (×5): 1.5 mg via INTRAVENOUS
  Administered 2016-02-14: 3 mg via INTRAVENOUS
  Administered 2016-02-14: 0 mg via INTRAVENOUS
  Administered 2016-02-15 (×2): 1.5 mg via INTRAVENOUS
  Administered 2016-02-15: 3 mg via INTRAVENOUS
  Filled 2016-02-12 (×2): qty 25

## 2016-02-12 NOTE — Care Management Note (Signed)
Case Management Note  Patient Details  Name: Sue [name] MRN: [id number] Date of Birth: 1950-10-16  Subjective/Objective:                    Action/Plan:  Hartman's procedure , will need home health RN order with wound care instructions . Expected Discharge Date:                  Expected Discharge Plan:  [place]  In-House Referral:     Discharge planning Services     Post Acute Care Choice:    Choice offered to:     DME Arranged:    DME Agency:     HH Arranged:    [place] Agency:     Status of Service:  In process, will continue to follow  Medicare Important Message Given:    Date Medicare IM Given:    Medicare IM give by:    Date Additional Medicare IM Given:    Additional Medicare Important Message give by:     If discussed at [place] of Stay Meetings, dates discussed:    Additional Comments:  Marilu Favre, RN 02/12/2016, 11:19 AM

## 2016-02-12 NOTE — Progress Notes (Signed)
1 Day Post-Op  Subjective: She looks good, WBC is elevated and NG not draining much.  It may not be in far enough.  No bowel sounds, Open wound and ostomy look good.    Objective: Vital signs in last 24 hours: Temp:  [97.3 F (36.3 C)-99.4 F (37.4 C)] 97.7 F (36.5 C) (06/01 0940) Pulse Rate:  [83-93] 87 (06/01 0940) Resp:  [16-18] 16 (06/01 0940) BP: (158-195)/(74-113) 178/74 mmHg (06/01 0940) SpO2:  [96 %-100 %] 96 % (06/01 0940) Weight:  [57.153 kg (126 lb)] 57.153 kg (126 lb) (05/31 2203)   950 IV 625 urine so far Colostomy 0 Afebrile, VSS BP up some, HR 80-90 range WBC 29K Intake/Output from previous day: 05/31 0701 - 06/01 0700 In: 950 [I.V.:950] Out: 1040 [Urine:625; Drains:315; Blood:100] Intake/Output this shift:    General appearance: alert, cooperative and no distress Resp: clear to auscultation bilaterally GI: soft, very tender, but does not have a peritionitis, nothing in the ostomy bag,   Lab Results:   Recent Labs  02/11/16 1639 02/12/16 0320  WBC 22.1* 29.7*  HGB 12.2 12.7  HCT 36.8 38.2  PLT 453* 477*    BMET  Recent Labs  02/11/16 1639 02/12/16 0320  NA 133* 135  K 3.2* 3.8  CL 94* 100*  CO2 26 25  GLUCOSE 132* 184*  BUN 10 10  CREATININE 0.79 0.74  CALCIUM 8.9 8.2*   PT/INR No results for input(s): LABPROT, INR in the last 72 hours.   Recent Labs Lab 02/11/16 1639  AST 44*  ALT 76*  ALKPHOS 158*  BILITOT 0.4  PROT 7.0  ALBUMIN 2.8*     Lipase     Component Value Date/Time   LIPASE 28 02/11/2016 1639     Studies/Results: Ct Abdomen Pelvis W Contrast  02/11/2016  CLINICAL DATA:  Lower abdominal pain for several weeks. EXAM: CT ABDOMEN AND PELVIS WITH CONTRAST TECHNIQUE: Multidetector CT imaging of the abdomen and pelvis was performed using the standard protocol following bolus administration of intravenous contrast. CONTRAST:  128mL ISOVUE-300 IOPAMIDOL (ISOVUE-300) INJECTION 61% COMPARISON:  None. FINDINGS: Moderate  degenerative disc disease is noted at L4-5. Visualized lung bases are unremarkable. No gallstones are noted. Small hepatic cysts are noted. The spleen and pancreas appear normal. Adrenal glands and kidneys appear normal. No hydronephrosis or renal obstruction is noted. Atherosclerosis of abdominal aorta is noted without aneurysm formation. Urinary bladder is decompressed. Uterus and left ovary appear normal. 3.9 cm right ovarian cyst is noted. Severe sigmoid diverticulitis is noted with 7.7 x 3.4 cm abscess in the pre rectal space of the pelvis. Large area of free gas is noted in the right pelvis which communicates with the abscess. Pneumoperitoneum is also noted in the epigastric region, consistent with rupture of hollow viscus. Mildly dilated small bowel loops are noted which most likely is inflammatory in etiology. IMPRESSION: Atherosclerosis of abdominal aorta without aneurysm formation. 3.9 cm right ovarian cyst is noted. Pelvic ultrasound is recommended for further evaluation. Severe sigmoid diverticulitis is noted with large abscess in the pre rectal space of the pelvis. Large area of pneumoperitoneum is noted in right pelvis which communicates with abscess, and mild pneumoperitoneum is noted in epigastric region is well. This is consistent with rupture of hollow viscus. Critical Value/emergent results were called by telephone at the time of interpretation on 02/11/2016 at 3:50 pm to Dr. Lew Dawes , who verbally acknowledged these results. Electronically Signed   By: Marijo Conception, M.D.  On: 02/11/2016 15:50   Prior to Admission medications   Medication Sig Start Date End Date Taking? Authorizing Provider  ALPRAZolam (XANAX) 0.25 MG tablet TAKE 1 TABLET BY MOUTH TWICE A DAY AS NEEDED FOR ANXIETY 08/22/15  Yes Aleksei Plotnikov V, MD  calcium-vitamin D ([employer] OYSTER SHELL CALCIUM-VIT D) 500-200 MG-UNIT tablet Take 2 tablets by mouth daily.   Yes Historical Provider, MD  Chelated Potassium 99 MG  TABS Take by mouth as needed.     Yes Historical Provider, MD  Esterified Estrogens (MENEST) 0.3 MG tablet take 1/2 tablet my mouth every other day    Yes Historical Provider, MD  ESTRACE VAGINAL 0.1 MG/GM vaginal cream  05/20/15  Yes Historical Provider, MD  GLUCOSAMINE HCL PO Take by mouth daily.     Yes Historical Provider, MD  LATISSE 0.03 % ophthalmic solution as needed. 08/18/15  Yes Historical Provider, MD  lidocaine-hydrocortisone (ANAMANTEL HC) 3-0.5 % CREA  05/03/14  Yes Historical Provider, MD  Melatonin 5 MG CAPS Take by mouth as needed.     Yes Historical Provider, MD  metoprolol succinate (TOPROL-XL) 25 MG 24 hr tablet TAKE 1 TABLET BY MOUTH EVERY DAY 07/07/15  Yes Aleksei Plotnikov V, MD  metoprolol succinate (TOPROL-XL) 25 MG 24 hr tablet Take 12.5 mg by mouth 2 (two) times daily.   Yes Historical Provider, MD  Multiple Vitamin (MULTIVITAMIN) capsule Take 1 capsule by mouth daily.     Yes Historical Provider, MD  Phosphatidylserine-DHA-EPA (VAYACOG) 100-19.5-6.5 MG CAPS Take 1 capsule by mouth daily. 05/22/15 Dietary supplement   Yes Aleksei Plotnikov V, MD  RESTASIS 0.05 % ophthalmic emulsion  05/02/14  Yes Historical Provider, MD  triamterene-hydrochlorothiazide (MAXZIDE-25) 37.5-25 MG tablet Take 1 tablet by mouth daily. 08/22/15  Yes Aleksei Plotnikov V, MD  zolpidem (AMBIEN) 5 MG tablet Take 1 tablet (5 mg total) by mouth at bedtime as needed. for sleep 08/22/15  Yes Cassandria Anger, MD  clotrimazole [facility]) 10 MG troche  04/29/14   Historical Provider, MD  Desoximetasone (TOPICORT) 0.25 % ointment Apply bid for aphtous ulcer 07/20/11   Neena Rhymes, MD    Medications: . ceFEPime (MAXIPIME) IV  2 g Intravenous Q8H   And  . metronidazole  500 mg Intravenous Q8H  . enoxaparin (LOVENOX) injection  40 mg Subcutaneous Q24H   . dextrose 5 % and 0.45 % NaCl with KCl 20 mEq/L 75 mL/hr at 02/11/16 2333   Assessment/Plan Perforated diverticulitis S/p laparotomy, partial  colectomy and colostomy (Hartman's Procedure), 02/11/16, Dr. Kieth Brightly Hypertension (maxide and toprol XL at home) FEN:  NPO/IV fluids ID: day 2 cefepime/metronidazole  DVT:  Lovenox/SCD  Plan:  OOB to chair today, d/c NG, start dressing changes, add metoprolol for her BP and Dr. Redmond Pulling would like Medicine to follow for her BP.  I will put her on telemetry also.  D/c foley tomorrow.        LOS: 1 day    Sue [name] 02/12/2016 [phone]

## 2016-02-12 NOTE — Consult Note (Signed)
Medical Consultation   Sue [name]  [id number]  DOB: [birth year]  DOA: 02/11/2016  PCP: Walker Kehr, MD    Requesting physician: Dr. Kieth Brightly   Reason for consultation: Uncontrolled HTN   History of Present Illness: Sue [name] is an 65 y.o. female with a history of HTN admitted by the surgical service due to diverticulitis who underwent surgical treatment with hartman's Procedure on 5/31, and for whom medical consultation has been requested due to uncontrolled hypertension. The patient is currently NPO and has not been receiving her home metoprolol and triamterene/hctz. She was started on IV lopressor 2.5mg  Q6 hours this morning. She currently denies nausea, chest pain, dizziness. Feels warm, has some abdominal pain which is controlled by PCA pump. She says she had uncontrolled HTN in the past when she was off of her medications a few years ago. She has not had BM/flatus. NG tube was removed this Am.   Review of Systems:  ROS As per HPI otherwise 10 point review of systems negative.    Past Medical History: Past Medical History  Diagnosis Date  . FRACTURE, ARM, LEFT 11/15/2007  . FRACTURE, FOOT 11/15/2007  . FRACTURE, INDEX FINGER, LEFT 11/15/2007  . HYPERTENSION 11/15/2007  . INSOMNIA, CHRONIC 06/17/2009  . MITRAL VALVE PROLAPSE, HX OF 11/15/2007  . OSTEOARTHRITIS 11/15/2007  . RIB FRACTURE 11/15/2007  . Scarlet fever 11/15/2007  . TONSILLECTOMY, HX OF 11/15/2007  . Subdural hematoma [facility])     Past Surgical History: Past Surgical History  Procedure Laterality Date  . Tonsillectomy    . Partial colectomy N/A 02/11/2016    Procedure: PARTIAL COLECTOMY with colostomy;  Surgeon: Mickeal Skinner, MD;  Location: [place];  Service: General;  Laterality: N/A;     Allergies:   Allergies  Allergen Reactions  . Clindamycin Anaphylaxis  . Latex     REACTION: sores in mouth  . Penicillins Hives     Social History:  reports that she has never  smoked. She does not have any smokeless tobacco history on file. She reports that she drinks alcohol. She reports that she does not use illicit drugs.   Family History: Family History  Problem Relation Age of Onset  . Parkinsonism Other   . Cancer Mother   . Hypertension Father   . Cancer Father     Unacceptable: Noncontributory, unremarkable, or negative. Acceptable: Family history reviewed and not pertinent (If you reviewed it)   Physical Exam: Filed Vitals:   02/12/16 0100 02/12/16 0551 02/12/16 0940 02/12/16 1231  BP: 177/89 170/84 178/74   Pulse: 90 83 87   Temp: 98.6 F (37 C) 98.5 F (36.9 C) 97.7 F (36.5 C)   TempSrc: Oral Axillary Oral   Resp: 16 16 16 15   Height:      Weight:      SpO2: 99% 98% 96% 96%    Constitutional: Appearance,  Alert and awake, oriented x3, not in any acute distress. Tired appearing. Eyes: PERLA, EOMI, irises appear normal, anicteric sclera,  ENMT: external ears and nose appear normal, normal hearing or hard of hearing            Lips appears normal, oropharynx mucosa, tongue, posterior pharynx appear normal  Neck: neck appears normal, no masses, normal ROM, no thyromegaly, no JVD  CVS: S1-S2 clear, no murmur rubs or gallops, no LE edema, normal pedal pulses  Respiratory:  clear to auscultation bilaterally, no  wheezing, rales or rhonchi. Respiratory effort normal. No accessory muscle use.  Abdomen: soft,  Appropriately tender, minmally distended, no bowel sounds heard, healthy appearing ostomy in place on LLQ with clean dressings on the right Musculoskeletal: : no cyanosis, clubbing or edema noted bilaterally Neuro: Cranial nerves II-XII intact, strength, sensation, reflexes Psych: judgement and insight appear normal, stable mood and affect, mental status Skin: no rashes or lesions or ulcers, no induration or nodules   Data reviewed:  I have personally reviewed following labs and imaging studies Labs:  CBC:  Recent Labs Lab  02/11/16 1639 02/12/16 0320  WBC 22.1* 29.7*  NEUTROABS 19.4*  --   HGB 12.2 12.7  HCT 36.8 38.2  MCV 88.7 90.7  PLT 453* 477*    Basic Metabolic Panel:  Recent Labs Lab 02/11/16 1639 02/12/16 0320  NA 133* 135  K 3.2* 3.8  CL 94* 100*  CO2 26 25  GLUCOSE 132* 184*  BUN 10 10  CREATININE 0.79 0.74  CALCIUM 8.9 8.2*   GFR Estimated Creatinine Clearance: 58.9 mL/min (by C-G formula based on Cr of 0.74). Liver Function Tests:  Recent Labs Lab 02/11/16 1639  AST 44*  ALT 76*  ALKPHOS 158*  BILITOT 0.4  PROT 7.0  ALBUMIN 2.8*    Recent Labs Lab 02/11/16 1639  LIPASE 28   No results for input(s): AMMONIA in the last 168 hours. Coagulation profile No results for input(s): INR, PROTIME in the last 168 hours.  Cardiac Enzymes: No results for input(s): CKTOTAL, CKMB, CKMBINDEX, TROPONINI in the last 168 hours. BNP: Invalid input(s): POCBNP CBG: No results for input(s): GLUCAP in the last 168 hours. D-Dimer No results for input(s): DDIMER in the last 72 hours. Hgb A1c No results for input(s): HGBA1C in the last 72 hours. Lipid Profile No results for input(s): CHOL, HDL, LDLCALC, TRIG, CHOLHDL, LDLDIRECT in the last 72 hours. Thyroid function studies No results for input(s): TSH, T4TOTAL, T3FREE, THYROIDAB in the last 72 hours.  Invalid input(s): FREET3 Anemia work up No results for input(s): VITAMINB12, FOLATE, FERRITIN, TIBC, IRON, RETICCTPCT in the last 72 hours. Urinalysis    Component Value Date/Time   COLORURINE YELLOW 02/02/2016 [place] 02/02/2016 1233   LABSPEC 1.015 02/02/2016 1233   PHURINE 6.5 02/02/2016 1233   GLUCOSEU NEGATIVE 02/02/2016 1233   HGBUR NEGATIVE 02/02/2016 1233   BILIRUBINUR NEGATIVE 02/02/2016 1233   KETONESUR NEGATIVE 02/02/2016 1233   UROBILINOGEN 0.2 02/02/2016 1233   NITRITE NEGATIVE 02/02/2016 1233   [place] 02/02/2016 1233     Microbiology No results found for this or any  previous visit (from the past 240 hour(s)).     Inpatient Medications:   Scheduled Meds: . ceFEPime (MAXIPIME) IV  2 g Intravenous Q8H   And  . metronidazole  500 mg Intravenous Q8H  . enoxaparin (LOVENOX) injection  40 mg Subcutaneous Q24H  . metoprolol  5 mg Intravenous Q6H  . morphine   Intravenous Q4H   Continuous Infusions: . dextrose 5 % and 0.45 % NaCl with KCl 20 mEq/L 75 mL/hr at 02/11/16 2333     Radiological Exams on Admission: Ct Abdomen Pelvis W Contrast  02/11/2016  CLINICAL DATA:  Lower abdominal pain for several weeks. EXAM: CT ABDOMEN AND PELVIS WITH CONTRAST TECHNIQUE: Multidetector CT imaging of the abdomen and pelvis was performed using the standard protocol following bolus administration of intravenous contrast. CONTRAST:  156mL ISOVUE-300 IOPAMIDOL (ISOVUE-300) INJECTION 61% COMPARISON:  None. FINDINGS: Moderate degenerative disc disease  is noted at L4-5. Visualized lung bases are unremarkable. No gallstones are noted. Small hepatic cysts are noted. The spleen and pancreas appear normal. Adrenal glands and kidneys appear normal. No hydronephrosis or renal obstruction is noted. Atherosclerosis of abdominal aorta is noted without aneurysm formation. Urinary bladder is decompressed. Uterus and left ovary appear normal. 3.9 cm right ovarian cyst is noted. Severe sigmoid diverticulitis is noted with 7.7 x 3.4 cm abscess in the pre rectal space of the pelvis. Large area of free gas is noted in the right pelvis which communicates with the abscess. Pneumoperitoneum is also noted in the epigastric region, consistent with rupture of hollow viscus. Mildly dilated small bowel loops are noted which most likely is inflammatory in etiology. IMPRESSION: Atherosclerosis of abdominal aorta without aneurysm formation. 3.9 cm right ovarian cyst is noted. Pelvic ultrasound is recommended for further evaluation. Severe sigmoid diverticulitis is noted with large abscess in the pre rectal space of  the pelvis. Large area of pneumoperitoneum is noted in right pelvis which communicates with abscess, and mild pneumoperitoneum is noted in epigastric region is well. This is consistent with rupture of hollow viscus. Critical Value/emergent results were called by telephone at the time of interpretation on 02/11/2016 at 3:50 pm to Dr. Lew Dawes , who verbally acknowledged these results. Electronically Signed   By: Marijo Conception, M.D.   On: 02/11/2016 15:50    Impression/Recommendations Active Problems:    HTN - pt with essential HTN on Toprol XL and HCTZ/triamterene at home. Uncontrolled now due to no PO meds while NPO, as well as pain and stress of surgery. Rationale for treatment explained to patient. - increase scheduled IV lopressor to 5mg  IV Q6 hours - add Hydralazine 10mg  IV Q6 prn SBP > 165 - recommend resume home meds when okay with surgery (once on clears)  Perforated diverticulum of large intestine - per surgical service  HypoKalemia - repleted by surgery team 5/31, follow     Thank you for this consultation.  Our [facility] hospitalist team will follow the patient with you.   Time Spent: 65 min  Slyvester Latona Marry Guan M.D. [facility] 02/12/2016, 12:46 PM

## 2016-02-12 NOTE — Consult Note (Addendum)
WOC ostomy consult note Stoma type/location: Stoma to RLQ from colostomy surgery was was performed yesterday. Stomal assessment/size: Stoma red and viable when visualized through pouch, which is intact with good seal. Output: Scant amt pink drainage in pouch, no stool or flatus  Ostomy pouching: 2pc.  Education provided: Pt is in pain and it is the first post-op day.  Supplies and educational materials left in the room and will perform pouch change tomorrow.  Briefly discussed pouching routines with patient and she asked appropriate questions. Enrolled patient in Person program: No Julien Girt MSN, [place], [place], [place], [place]

## 2016-02-13 ENCOUNTER — Other Ambulatory Visit: Payer: Medicare Other

## 2016-02-13 DIAGNOSIS — D649 Anemia, unspecified: Secondary | ICD-10-CM

## 2016-02-13 DIAGNOSIS — K5730 Diverticulosis of large intestine without perforation or abscess without bleeding: Secondary | ICD-10-CM

## 2016-02-13 DIAGNOSIS — E876 Hypokalemia: Secondary | ICD-10-CM

## 2016-02-13 DIAGNOSIS — I10 Essential (primary) hypertension: Secondary | ICD-10-CM

## 2016-02-13 LAB — BASIC METABOLIC PANEL
Anion gap: 7 (ref 5–15)
BUN: 13 mg/dL (ref 6–20)
CALCIUM: 7.9 mg/dL — AB (ref 8.9–10.3)
CO2: 27 mmol/L (ref 22–32)
CREATININE: 0.66 mg/dL (ref 0.44–1.00)
Chloride: 100 mmol/L — ABNORMAL LOW (ref 101–111)
GFR calc Af Amer: >60 mL/min (ref >60)
GLUCOSE: 116 mg/dL — AB (ref 65–99)
Potassium: 4.3 mmol/L (ref 3.5–5.1)
Sodium: 134 mmol/L — ABNORMAL LOW (ref 135–145)

## 2016-02-13 LAB — CBC
HCT: 31.7 % — ABNORMAL LOW (ref 36.0–46.0)
Hemoglobin: 10.4 g/dL — ABNORMAL LOW (ref 12.0–15.0)
MCH: 29.5 pg (ref 26.0–34.0)
MCHC: 32.8 g/dL (ref 30.0–36.0)
MCV: 90.1 fL (ref 78.0–100.0)
PLATELETS: 425 10*3/uL — AB (ref 150–400)
RBC: 3.52 MIL/uL — ABNORMAL LOW (ref 3.87–5.11)
RDW: 14.2 % (ref 11.5–15.5)
WBC: 17.8 10*3/uL — ABNORMAL HIGH (ref 4.0–10.5)

## 2016-02-13 MED ORDER — LORAZEPAM 2 MG/ML IJ SOLN: 0.2500 mg | Freq: Three times a day (TID) | INTRAMUSCULAR | Status: DC | PRN

## 2016-02-13 MED ORDER — ALPRAZOLAM 0.25 MG PO TABS
0.2500 mg | ORAL_TABLET | Freq: Three times a day (TID) | ORAL | Status: DC | PRN
  Administered 2016-02-13 – 2016-02-17 (×5): 0.25 mg via ORAL
  Filled 2016-02-13 (×5): qty 1

## 2016-02-13 MED ORDER — WHITE PETROLATUM GEL
Status: AC
  Administered 2016-02-13: 0.2
  Filled 2016-02-13: qty 1

## 2016-02-13 NOTE — Progress Notes (Signed)
PROGRESS NOTE    Sue [name]  [id number] DOB: 11-15-1950 DOA: 02/11/2016 PCP: Walker Kehr, MD   Brief Narrative:  Sue [name] is an 65 y.o. female with a history of HTN admitted by the surgical service due to diverticulitis who underwent surgical treatment with hartman's Procedure on 5/31, and for whom medical consultation has been requested due to uncontrolled hypertension.   Assessment & Plan   Essential hypertension -Patient was using Toprol as well as HCTZ and triamterene at home -She was nothing by mouth due to her recent surgery however was restarted on clear liquids -Patient on scheduled metoprolol IV as well as hydralazine PRN -Patient recently started on clear liquid diet today. If tolerated, would transition patient to her home medications tomorrow, 02/14/2016.  Perforated diverticulum and large intestine -Per primary service, general surgery -Continue cefepime and Flagyl  Hypokalemia -Resolved -Continue to monitor BMP  Normocytic anemia -Hemoglobin dropped to 10.4, baseline around 12 -Likely dilutional vs surgery -Continue to monitor CBC  DVT Prophylaxis  Lovenox  Code Status: Full  Family Communication: None at bedside  Disposition Plan: Admitted, per surgery  Consultants [place]  Procedures  laparotomy, partial colectomy and colostomy (Hartman's Procedure), 02/11/16, Dr. Kieth Brightly  Antibiotics   Anti-infectives    Start     Dose/Rate Route Frequency Ordered Stop   02/12/16 0100  metroNIDAZOLE (FLAGYL) IVPB 500 mg     500 mg 100 mL/hr over 60 Minutes Intravenous Every 8 hours 02/11/16 2150     02/12/16 0000  ceFEPIme (MAXIPIME) 2 g in dextrose 5 % 50 mL IVPB     2 g 100 mL/hr over 30 Minutes Intravenous Every 8 hours 02/11/16 2150     02/11/16 1645  ceFEPIme (MAXIPIME) 2 g in dextrose 5 % 50 mL IVPB     2 g 100 mL/hr over 30 Minutes Intravenous  Once 02/11/16 1644 02/11/16 1809   02/11/16 1645  metroNIDAZOLE (FLAGYL) IVPB 500 mg       500 mg 100 mL/hr over 60 Minutes Intravenous  Once 02/11/16 1644 02/11/16 1809      Subjective:   Sue [name] seen and examined today.  Patient is excited that she can have liquids today. Denies chest pain, shortness of breath, nausea or vomiting. Patient denies any flatness, burping or belching.  Objective:   Filed Vitals:   02/13/16 0347 02/13/16 0426 02/13/16 0830 02/13/16 1234  BP:  141/72    Pulse:  67    Temp:  98.4 F (36.9 C)    TempSrc:  Oral    Resp: 16 19 14 20   Height:      Weight:      SpO2: 97% 97% 99% 99%    Intake/Output Summary (Last 24 hours) at 02/13/16 1429 Last data filed at 02/13/16 0900  Gross per 24 hour  Intake    240 ml  Output    820 ml  Net   -580 ml   Filed Weights   02/11/16 1629 02/11/16 2203  Weight: 57.153 kg (126 lb) 57.153 kg (126 lb)    Exam  General: Well developed, well nourished, NAD, appears stated age  25: NCAT,mucous membranes moist.   Cardiovascular: S1 S2 auscultated, no murmurs, RRR  Respiratory: Clear to auscultation bilaterally   Abdomen: Soft, diffusely TTP, nondistended, + bowel sounds,+ ostomy  Extremities: warm dry without cyanosis clubbing or edema  Neuro: AAOx3, nonfocal  Psych: Normal affect and demeanor with intact judgement and insight  Data Reviewed: I have personally reviewed following  labs and imaging studies  CBC:  Recent Labs Lab 02/11/16 1639 02/12/16 0320 02/13/16 0514  WBC 22.1* 29.7* 17.8*  NEUTROABS 19.4*  --   --   HGB 12.2 12.7 10.4*  HCT 36.8 38.2 31.7*  MCV 88.7 90.7 90.1  PLT 453* 477* [id number]*   Basic Metabolic Panel:  Recent Labs Lab 02/11/16 1639 02/12/16 0320 02/13/16 0514  NA 133* 135 134*  K 3.2* 3.8 4.3  CL 94* 100* 100*  CO2 26 25 27   GLUCOSE 132* 184* 116*  BUN 10 10 13   CREATININE 0.79 0.74 0.66  CALCIUM 8.9 8.2* 7.9*   GFR: Estimated Creatinine Clearance: 58.9 mL/min (by C-G formula based on Cr of 0.66). Liver Function Tests:  Recent  Labs Lab 02/11/16 1639  AST 44*  ALT 76*  ALKPHOS 158*  BILITOT 0.4  PROT 7.0  ALBUMIN 2.8*    Recent Labs Lab 02/11/16 1639  LIPASE 28   No results for input(s): AMMONIA in the last 168 hours. Coagulation Profile: No results for input(s): INR, PROTIME in the last 168 hours. Cardiac Enzymes: No results for input(s): CKTOTAL, CKMB, CKMBINDEX, TROPONINI in the last 168 hours. BNP (last 3 results) No results for input(s): PROBNP in the last 8760 hours. HbA1C: No results for input(s): HGBA1C in the last 72 hours. CBG: No results for input(s): GLUCAP in the last 168 hours. Lipid Profile: No results for input(s): CHOL, HDL, LDLCALC, TRIG, CHOLHDL, LDLDIRECT in the last 72 hours. Thyroid Function Tests: No results for input(s): TSH, T4TOTAL, FREET4, T3FREE, THYROIDAB in the last 72 hours. Anemia Panel: No results for input(s): VITAMINB12, FOLATE, FERRITIN, TIBC, IRON, RETICCTPCT in the last 72 hours. Urine analysis:    Component Value Date/Time   COLORURINE YELLOW 02/02/2016 [place] 02/02/2016 1233   LABSPEC 1.015 02/02/2016 1233   PHURINE 6.5 02/02/2016 1233   GLUCOSEU NEGATIVE 02/02/2016 1233   HGBUR NEGATIVE 02/02/2016 1233   BILIRUBINUR NEGATIVE 02/02/2016 1233   KETONESUR NEGATIVE 02/02/2016 1233   UROBILINOGEN 0.2 02/02/2016 1233   NITRITE NEGATIVE 02/02/2016 1233   LEUKOCYTESUR NEGATIVE 02/02/2016 1233   Sepsis Labs: @LABRCNTIP (procalcitonin:4,lacticidven:4)  )No results found for this or any previous visit (from the past 240 hour(s)).    Radiology Studies: Ct Abdomen Pelvis W Contrast  02/11/2016  CLINICAL DATA:  Lower abdominal pain for several weeks. EXAM: CT ABDOMEN AND PELVIS WITH CONTRAST TECHNIQUE: Multidetector CT imaging of the abdomen and pelvis was performed using the standard protocol following bolus administration of intravenous contrast. CONTRAST:  138mL ISOVUE-300 IOPAMIDOL (ISOVUE-300) INJECTION 61% COMPARISON:  None. FINDINGS:  Moderate degenerative disc disease is noted at L4-5. Visualized lung bases are unremarkable. No gallstones are noted. Small hepatic cysts are noted. The spleen and pancreas appear normal. Adrenal glands and kidneys appear normal. No hydronephrosis or renal obstruction is noted. Atherosclerosis of abdominal aorta is noted without aneurysm formation. Urinary bladder is decompressed. Uterus and left ovary appear normal. 3.9 cm right ovarian cyst is noted. Severe sigmoid diverticulitis is noted with 7.7 x 3.4 cm abscess in the pre rectal space of the pelvis. Large area of free gas is noted in the right pelvis which communicates with the abscess. Pneumoperitoneum is also noted in the epigastric region, consistent with rupture of hollow viscus. Mildly dilated small bowel loops are noted which most likely is inflammatory in etiology. IMPRESSION: Atherosclerosis of abdominal aorta without aneurysm formation. 3.9 cm right ovarian cyst is noted. Pelvic ultrasound is recommended for further evaluation. Severe sigmoid diverticulitis is  noted with large abscess in the pre rectal space of the pelvis. Large area of pneumoperitoneum is noted in right pelvis which communicates with abscess, and mild pneumoperitoneum is noted in epigastric region is well. This is consistent with rupture of hollow viscus. Critical Value/emergent results were called by telephone at the time of interpretation on 02/11/2016 at 3:50 pm to Dr. Lew Dawes , who verbally acknowledged these results. Electronically Signed   By: Marijo Conception, M.D.   On: 02/11/2016 15:50     Scheduled Meds: . ceFEPime (MAXIPIME) IV  2 g Intravenous Q8H   And  . metronidazole  500 mg Intravenous Q8H  . enoxaparin (LOVENOX) injection  40 mg Subcutaneous Q24H  . metoprolol  5 mg Intravenous Q6H  . morphine   Intravenous Q4H   Continuous Infusions: . dextrose 5 % and 0.45 % NaCl with KCl 20 mEq/L 75 mL/hr at 02/13/16 1233     LOS: 2 days   Time Spent in  minutes   30 minutes  Conrado Nance D.O. on 02/13/2016 at 2:29 PM  Between 7am to 7pm - Pager - [phone]  After 7pm go to [email or web address] - password [email or web address]  And look for the night coverage person covering for me after hours  [facility] Office  [phone]

## 2016-02-13 NOTE — Progress Notes (Signed)
2 Days Post-Op  Subjective: Blood draw was very uncomfortable this am. Hasn't walked in halls. Foley out this am. No n/v. No air in bag. Had some sips of liquids. No burping/belching  Objective: Vital signs in last 24 hours: Temp:  [97.7 F (36.5 C)-99.8 F (37.7 C)] 98.4 F (36.9 C) (06/02 0426) Pulse Rate:  [67-91] 67 (06/02 0426) Resp:  [15-20] 19 (06/02 0426) BP: (141-178)/(69-87) 141/72 mmHg (06/02 0426) SpO2:  [92 %-100 %] 97 % (06/02 0426) Last BM Date: 02/10/16  Intake/Output from previous day: 06/01 0701 - 06/02 0700 In: 100 [NG/GT:100] Out: 1030 [Urine:800; Emesis/NG output:200; Drains:30] Intake/Output this shift:    Alert, nad cta b/l; IS 1200 Reg Soft, nd, approp TTP; ostmy -edematous, viable. No air in bag No edema  Lab Results:   Recent Labs  02/12/16 0320 02/13/16 0514  WBC 29.7* 17.8*  HGB 12.7 10.4*  HCT 38.2 31.7*  PLT 477* 425*   BMET  Recent Labs  02/12/16 0320 02/13/16 0514  NA 135 134*  K 3.8 4.3  CL 100* 100*  CO2 25 27  GLUCOSE 184* 116*  BUN 10 13  CREATININE 0.74 0.66  CALCIUM 8.2* 7.9*   PT/INR No results for input(s): LABPROT, INR in the last 72 hours. ABG No results for input(s): PHART, HCO3 in the last 72 hours.  Invalid input(s): PCO2, PO2  Studies/Results: Ct Abdomen Pelvis W Contrast  02/11/2016  CLINICAL DATA:  Lower abdominal pain for several weeks. EXAM: CT ABDOMEN AND PELVIS WITH CONTRAST TECHNIQUE: Multidetector CT imaging of the abdomen and pelvis was performed using the standard protocol following bolus administration of intravenous contrast. CONTRAST:  128mL ISOVUE-300 IOPAMIDOL (ISOVUE-300) INJECTION 61% COMPARISON:  None. FINDINGS: Moderate degenerative disc disease is noted at L4-5. Visualized lung bases are unremarkable. No gallstones are noted. Small hepatic cysts are noted. The spleen and pancreas appear normal. Adrenal glands and kidneys appear normal. No hydronephrosis or renal obstruction is noted.  Atherosclerosis of abdominal aorta is noted without aneurysm formation. Urinary bladder is decompressed. Uterus and left ovary appear normal. 3.9 cm right ovarian cyst is noted. Severe sigmoid diverticulitis is noted with 7.7 x 3.4 cm abscess in the pre rectal space of the pelvis. Large area of free gas is noted in the right pelvis which communicates with the abscess. Pneumoperitoneum is also noted in the epigastric region, consistent with rupture of hollow viscus. Mildly dilated small bowel loops are noted which most likely is inflammatory in etiology. IMPRESSION: Atherosclerosis of abdominal aorta without aneurysm formation. 3.9 cm right ovarian cyst is noted. Pelvic ultrasound is recommended for further evaluation. Severe sigmoid diverticulitis is noted with large abscess in the pre rectal space of the pelvis. Large area of pneumoperitoneum is noted in right pelvis which communicates with abscess, and mild pneumoperitoneum is noted in epigastric region is well. This is consistent with rupture of hollow viscus. Critical Value/emergent results were called by telephone at the time of interpretation on 02/11/2016 at 3:50 pm to Dr. Lew Dawes , who verbally acknowledged these results. Electronically Signed   By: Marijo Conception, M.D.   On: 02/11/2016 15:50    Anti-infectives: Anti-infectives    Start     Dose/Rate Route Frequency Ordered Stop   02/12/16 0100  metroNIDAZOLE (FLAGYL) IVPB 500 mg     500 mg 100 mL/hr over 60 Minutes Intravenous Every 8 hours 02/11/16 2150     02/12/16 0000  ceFEPIme (MAXIPIME) 2 g in dextrose 5 % 50 mL IVPB  2 g 100 mL/hr over 30 Minutes Intravenous Every 8 hours 02/11/16 2150     02/11/16 1645  ceFEPIme (MAXIPIME) 2 g in dextrose 5 % 50 mL IVPB     2 g 100 mL/hr over 30 Minutes Intravenous  Once 02/11/16 1644 02/11/16 1809   02/11/16 1645  metroNIDAZOLE (FLAGYL) IVPB 500 mg     500 mg 100 mL/hr over 60 Minutes Intravenous  Once 02/11/16 1644 02/11/16 1809       Assessment/Plan: Perforated diverticulitis S/p laparotomy, partial colectomy and colostomy (Hartman's Procedure), 02/11/16, Dr. Kieth Brightly Hypertension (maxide and toprol XL at home), appreciate [facility] assist FEN: start clears. Told pt to take slow/IV fluids PULM: oob, ambulate in halls, IS GI: start clears, start ostomy teaching. Will need HHN at discharge for ostomy support, wound ID: day 3 cefepime/metronidazole  DVT: Lovenox/SCD  Discussed overview of service, typical postop course and when typical dc occurs.   Leighton Ruff. Redmond Pulling, MD, FACS General, Bariatric, & Minimally Invasive Surgery [facility] Surgery, [place]   LOS: 2 days    Gayland Curry 02/13/2016

## 2016-02-13 NOTE — Progress Notes (Addendum)
Patient wants that W-D dressing change be done tomorrow around 0800 because she does not want to be disturbed while asleep since she did not sleep well last night..  CNA likewise advised not to take any VS while patient asleep.  Patient is on telemetry monitor, VS stable and resting comfortably on bed with both eyes close as of this writing.

## 2016-02-13 NOTE — Care Management Important Message (Signed)
Important Message  Patient Details  Name: Sue [name] MRN: [id number] Date of Birth: Mar 04, 1951   Medicare Important Message Given:  Yes    [name] 02/13/2016, 11:07 AM

## 2016-02-13 NOTE — Progress Notes (Signed)
Pt now has a prn order xanax. Please administer xanax around 8pm tonight. Will report to oncoming nurse

## 2016-02-13 NOTE — Progress Notes (Signed)
Pt assisted with all grooming needs by nurse tech Varney Biles. Also helped into recliner. Her friend at bedside, no concerns expressed

## 2016-02-13 NOTE — Progress Notes (Signed)
Pt requested ambien and xanax as sleep aids. PA notified . Prn ativan iv ordered as pt is still on pca pump. Pt agreeable to current orders

## 2016-02-13 NOTE — Consult Note (Signed)
WOC ostomy follow up Surgical team following for assessment and plan of care to abd wound. Stoma type/location:  Colostomy stoma to LLQ Stomal assessment/size: 2 inches, edematous, red and viable, above skin level Peristomal assessment: Intact skin surrounding Output: No stool or flatus  Ostomy pouching: 2pc.  Education provided:  Demonstrated pouch application and emptying.  Pt states she is not ready to look at the stoma yet.  She is able to open and close the velcro to empty.  Discussed pouching routines. She asks appropriate questions. Supplies and educational materials at the bedside. Will perform another teaching session on Monday. Enrolled patient in [place] Start Discharge program: Yes Please re-consult if further assistance is needed.  Thank-you,  Julien Girt MSN, [place], [place], [place], [place]

## 2016-02-13 NOTE — Anesthesia Postprocedure Evaluation (Signed)
Anesthesia Post Note  Patient: Sue [name]  Procedure(s) Performed: Procedure(s) (LRB): PARTIAL COLECTOMY with colostomy (N/A)  Patient location during evaluation: PACU Anesthesia Type: General Level of consciousness: awake, awake and alert and oriented Pain management: pain level controlled Vital Signs Assessment: post-procedure vital signs reviewed and stable Respiratory status: spontaneous breathing, nonlabored ventilation and respiratory function stable Cardiovascular status: blood pressure returned to baseline Anesthetic complications: no    Last Vitals:  Filed Vitals:   02/13/16 0830 02/13/16 1234  BP:    Pulse:    Temp:    Resp: 14 20    Last Pain:  Filed Vitals:   02/13/16 1236  PainSc: 3                  Ellis Koffler COKER

## 2016-02-14 LAB — BASIC METABOLIC PANEL
ANION GAP: 8 (ref 5–15)
BUN: 8 mg/dL (ref 6–20)
CO2: 26 mmol/L (ref 22–32)
Calcium: 8.2 mg/dL — ABNORMAL LOW (ref 8.9–10.3)
Chloride: 101 mmol/L (ref 101–111)
Creatinine, Ser: 0.62 mg/dL (ref 0.44–1.00)
GFR calc Af Amer: >60 mL/min (ref >60)
GLUCOSE: 126 mg/dL — AB (ref 65–99)
POTASSIUM: 3.6 mmol/L (ref 3.5–5.1)
Sodium: 135 mmol/L (ref 135–145)

## 2016-02-14 LAB — CBC
HEMATOCRIT: 33.8 % — AB (ref 36.0–46.0)
Hemoglobin: 10.8 g/dL — ABNORMAL LOW (ref 12.0–15.0)
MCH: 29 pg (ref 26.0–34.0)
MCHC: 32 g/dL (ref 30.0–36.0)
MCV: 90.9 fL (ref 78.0–100.0)
PLATELETS: 448 10*3/uL — AB (ref 150–400)
RBC: 3.72 MIL/uL — AB (ref 3.87–5.11)
RDW: 14.1 % (ref 11.5–15.5)
WBC: 15.3 10*3/uL — AB (ref 4.0–10.5)

## 2016-02-14 MED ORDER — PHOSPHATIDYLSERINE-DHA-EPA 100-19.5-6.5 MG PO CAPS: 1.0000 | ORAL_CAPSULE | Freq: Every day | ORAL | Status: DC

## 2016-02-14 MED ORDER — PHOSPHATIDYLSERINE-DHA-EPA 100-19.5-6.5 MG PO CAPS
1.0000 | ORAL_CAPSULE | Freq: Every day | ORAL | Status: DC
  Administered 2016-02-14 – 2016-02-17 (×4): 1 via ORAL
  Filled 2016-02-14 (×3): qty 1

## 2016-02-14 MED ORDER — METOPROLOL SUCCINATE ER 25 MG PO TB24
12.5000 mg | ORAL_TABLET | Freq: Two times a day (BID) | ORAL | Status: DC
  Administered 2016-02-14 – 2016-02-18 (×9): 12.5 mg via ORAL
  Filled 2016-02-14 (×10): qty 1

## 2016-02-14 MED ORDER — TRIAMTERENE-HCTZ 37.5-25 MG PO TABS
1.0000 | ORAL_TABLET | Freq: Every day | ORAL | Status: DC
  Administered 2016-02-14 – 2016-02-18 (×5): 1 via ORAL
  Filled 2016-02-14 (×5): qty 1

## 2016-02-14 NOTE — Progress Notes (Signed)
3 Days Post-Op  Subjective: Feels a lot better. No stool or flatus, but tolerating clear liquids. Ambulating in room Foley out and voiding without difficulty Pain well controlled Afebrile.  BP 172/73. WBC down to 15,000.  Hemoglobin 10.8.  Potassium 3.6.  Creatinine 0.62.   Objective: Vital signs in last 24 hours: Temp:  [98.1 F (36.7 C)-98.9 F (37.2 C)] 98.1 F (36.7 C) (06/03 0500) Pulse Rate:  [70-77] 77 (06/03 0500) Resp:  [17-22] 22 (06/03 0822) BP: (152-172)/(68-87) 172/73 mmHg (06/03 0500) SpO2:  [98 %-100 %] 100 % (06/03 0822) Last BM Date: 02/10/16  Intake/Output from previous day: 06/02 0701 - 06/03 0700 In: 3013 [P.O.:780; I.V.:1783; IV Piggyback:450] Out: 1940 [id number]; Drains:15] Intake/Output this shift: Total I/O In: -  Out: 150 [Urine:150]  General appearance: Alert.  Looks good.  Mental status normal.  Very cooperative Resp: clear to auscultation bilaterally GI: Soft.  Bowel sounds rare.  Slightly distended.  Wounds clean.  JP drainage serosanguineous.  Stoma pink and viable.  No stool or air in bag.  Lab Results:  Results for orders placed or performed during the hospital encounter of 02/11/16 (from the past 24 hour(s))  CBC     Status: Abnormal   Collection Time: 02/14/16  6:08 AM  Result Value Ref Range   WBC 15.3 (H) 4.0 - 10.5 K/uL   RBC 3.72 (L) 3.87 - 5.11 MIL/uL   Hemoglobin 10.8 (L) 12.0 - 15.0 g/dL   HCT 33.8 (L) 36.0 - 46.0 %   MCV 90.9 78.0 - 100.0 fL   MCH 29.0 26.0 - 34.0 pg   MCHC 32.0 30.0 - 36.0 g/dL   RDW 14.1 11.5 - 15.5 %   Platelets 448 (H) 150 - 400 K/uL  Basic metabolic panel     Status: Abnormal   Collection Time: 02/14/16  6:08 AM  Result Value Ref Range   Sodium 135 135 - 145 mmol/L   Potassium 3.6 3.5 - 5.1 mmol/L   Chloride 101 101 - 111 mmol/L   CO2 26 22 - 32 mmol/L   Glucose, Bld 126 (H) 65 - 99 mg/dL   BUN 8 6 - 20 mg/dL   Creatinine, Ser 0.62 0.44 - 1.00 mg/dL   Calcium 8.2 (L) 8.9 - 10.3 mg/dL   GFR  calc non Af Amer >60 >60 mL/min   GFR calc Af Amer >60 >60 mL/min   Anion gap 8 5 - 15     Studies/Results: No results found.  Marland Kitchen ceFEPime (MAXIPIME) IV  2 g Intravenous Q8H   And  . metronidazole  500 mg Intravenous Q8H  . enoxaparin (LOVENOX) injection  40 mg Subcutaneous Q24H  . metoprolol  5 mg Intravenous Q6H  . morphine   Intravenous Q4H     Assessment/Plan: s/p Procedure(s): PARTIAL COLECTOMY with colostomy  Perforated diverticulitis POD#3 - laparotomy, partial colectomy and colostomy (Hartman's Procedure), 02/11/16, Dr. Kieth Brightly  Hypertension (maxide and toprol XL at home), appreciate triad assist.  Resume home meds 7. FEN: Stay on clears. Told pt to take slow/IV fluids PULM: oob, ambulate in halls, IS... Needs to increase ambulation in hall today.  She says she can do that. GI: start clears, start ostomy teaching. Will need HHN at discharge for ostomy support, wound ID: day 4 cefepime/metronidazole  DVT: Lovenox/SCD  @PROBHOSP @  LOS: 3 days    Asenath Balash M 02/14/2016  . .prob

## 2016-02-14 NOTE — Progress Notes (Signed)
This RN was able to do W-D dressing change since patient was awake to brush her teeth. CNA also took her VS.  Patient now resting comfortably on bed taking ice chips.

## 2016-02-14 NOTE — Progress Notes (Signed)
PROGRESS NOTE    Sue [name]  [id number] DOB: 1950-11-21 DOA: 02/11/2016 PCP: Walker Kehr, MD   Brief Narrative:  Sue [name] is an 65 y.o. female with a history of HTN admitted by the surgical service due to diverticulitis who underwent surgical treatment with hartman's Procedure on 5/31, and for whom medical consultation has been requested due to uncontrolled hypertension.   Assessment & Plan   Essential hypertension -Patient was using Toprol as well as HCTZ and triamterene at home -She was nothing by mouth due to her recent surgery however was restarted on clear liquids, which she is tolerating  -Patient on scheduled metoprolol IV as well as hydralazine PRN-  will transition back to metoprolol PO 12.5mg  BID and restart maxzide today  Perforated diverticulum and large intestine -Per primary service, general surgery -Continue cefepime and Flagyl  Hypokalemia -Resolved -Continue to monitor BMP  Normocytic anemia -Hemoglobin dropped to 10.8, baseline around 12 -Likely dilutional vs surgery -Continue to monitor CBC  DVT Prophylaxis  Lovenox  Code Status: Full  Family Communication: None at bedside  Disposition Plan: Admitted, per surgery  Consultants [place]  Procedures  laparotomy, partial colectomy and colostomy (Hartman's Procedure), 02/11/16, Dr. Kieth Brightly  Antibiotics   Anti-infectives    Start     Dose/Rate Route Frequency Ordered Stop   02/12/16 0100  metroNIDAZOLE (FLAGYL) IVPB 500 mg     500 mg 100 mL/hr over 60 Minutes Intravenous Every 8 hours 02/11/16 2150     02/12/16 0000  ceFEPIme (MAXIPIME) 2 g in dextrose 5 % 50 mL IVPB     2 g 100 mL/hr over 30 Minutes Intravenous Every 8 hours 02/11/16 2150     02/11/16 1645  ceFEPIme (MAXIPIME) 2 g in dextrose 5 % 50 mL IVPB     2 g 100 mL/hr over 30 Minutes Intravenous  Once 02/11/16 1644 02/11/16 1809   02/11/16 1645  metroNIDAZOLE (FLAGYL) IVPB 500 mg     500 mg 100 mL/hr over 60 Minutes  Intravenous  Once 02/11/16 1644 02/11/16 1809      Subjective:   Sue [name] seen and examined today.  Patient states she was able to tolerate clear liquids yesterday.  DeniesDenies chest pain, shortness of breath, nausea or vomiting. Patient denies any flatus, bowel movement.  Objective:   Filed Vitals:   02/14/16 0409 02/14/16 0500 02/14/16 0822 02/14/16 1008  BP:  172/73  153/87  Pulse:  77  78  Temp:  98.1 F (36.7 C)    TempSrc:  Oral    Resp: 17 19 22    Height:      Weight:      SpO2: 100% 100% 100%     Intake/Output Summary (Last 24 hours) at 02/14/16 1053 Last data filed at 02/14/16 [id number]  Gross per 24 hour  Intake   2773 ml  Output   2090 ml  Net    683 ml   Filed Weights   02/11/16 1629 02/11/16 2203  Weight: 57.153 kg (126 lb) 57.153 kg (126 lb)    Exam  General: Well developed, well nourished, NAD  HEENT: NCAT,mucous membranes moist.   Cardiovascular: S1 S2 auscultated, no murmurs, RRR  Respiratory: Clear to auscultation bilaterally   Abdomen: Soft, diffusely TTP, nondistended, + bowel sounds,+ ostomy  Extremities: warm dry without cyanosis clubbing or edema  Neuro: AAOx3, nonfocal  Psych: Normal affect and demeanor  Data Reviewed: I have personally reviewed following labs and imaging studies  CBC:  Recent Labs Lab  02/11/16 1639 02/12/16 0320 02/13/16 0514 02/14/16 0608  WBC 22.1* 29.7* 17.8* 15.3*  NEUTROABS 19.4*  --   --   --   HGB 12.2 12.7 10.4* 10.8*  HCT 36.8 38.2 31.7* 33.8*  MCV 88.7 90.7 90.1 90.9  PLT 453* 477* 425* [id number]*   Basic Metabolic Panel:  Recent Labs Lab 02/11/16 1639 02/12/16 0320 02/13/16 0514 02/14/16 0608  NA 133* 135 134* 135  K 3.2* 3.8 4.3 3.6  CL 94* 100* 100* 101  CO2 26 25 27 26   GLUCOSE 132* 184* 116* 126*  BUN 10 10 13 8   CREATININE 0.79 0.74 0.66 0.62  CALCIUM 8.9 8.2* 7.9* 8.2*   GFR: Estimated Creatinine Clearance: 58.9 mL/min (by C-G formula based on Cr of 0.62). Liver Function  Tests:  Recent Labs Lab 02/11/16 1639  AST 44*  ALT 76*  ALKPHOS 158*  BILITOT 0.4  PROT 7.0  ALBUMIN 2.8*    Recent Labs Lab 02/11/16 1639  LIPASE 28   No results for input(s): AMMONIA in the last 168 hours. Coagulation Profile: No results for input(s): INR, PROTIME in the last 168 hours. Cardiac Enzymes: No results for input(s): CKTOTAL, CKMB, CKMBINDEX, TROPONINI in the last 168 hours. BNP (last 3 results) No results for input(s): PROBNP in the last 8760 hours. HbA1C: No results for input(s): HGBA1C in the last 72 hours. CBG: No results for input(s): GLUCAP in the last 168 hours. Lipid Profile: No results for input(s): CHOL, HDL, LDLCALC, TRIG, CHOLHDL, LDLDIRECT in the last 72 hours. Thyroid Function Tests: No results for input(s): TSH, T4TOTAL, FREET4, T3FREE, THYROIDAB in the last 72 hours. Anemia Panel: No results for input(s): VITAMINB12, FOLATE, FERRITIN, TIBC, IRON, RETICCTPCT in the last 72 hours. Urine analysis:    Component Value Date/Time   COLORURINE YELLOW 02/02/2016 [place] 02/02/2016 1233   LABSPEC 1.015 02/02/2016 1233   PHURINE 6.5 02/02/2016 1233   GLUCOSEU NEGATIVE 02/02/2016 1233   HGBUR NEGATIVE 02/02/2016 1233   BILIRUBINUR NEGATIVE 02/02/2016 1233   KETONESUR NEGATIVE 02/02/2016 1233   UROBILINOGEN 0.2 02/02/2016 1233   NITRITE NEGATIVE 02/02/2016 1233   LEUKOCYTESUR NEGATIVE 02/02/2016 1233   Sepsis Labs: @LABRCNTIP (procalcitonin:4,lacticidven:4)  )No results found for this or any previous visit (from the past 240 hour(s)).    Radiology Studies: No results found.   Scheduled Meds: . ceFEPime (MAXIPIME) IV  2 g Intravenous Q8H   And  . metronidazole  500 mg Intravenous Q8H  . enoxaparin (LOVENOX) injection  40 mg Subcutaneous Q24H  . metoprolol succinate  12.5 mg Oral BID  . morphine   Intravenous Q4H  . Phosphatidylserine-DHA-EPA  1 capsule Oral QHS  . triamterene-hydrochlorothiazide  1 tablet Oral Daily     Continuous Infusions: . dextrose 5 % and 0.45 % NaCl with KCl 20 mEq/L 75 mL/hr at 02/14/16 0632     LOS: 3 days   Time Spent in minutes   30 minutes  Bentzion Dauria D.O. on 02/14/2016 at 10:53 AM  Between 7am to 7pm - Pager - [phone]  After 7pm go to [email or web address] - password [email or web address]  And look for the night coverage person covering for me after hours  [facility] Office  [phone]

## 2016-02-15 DIAGNOSIS — E875 Hyperkalemia: Secondary | ICD-10-CM

## 2016-02-15 LAB — CBC
HEMATOCRIT: 34.3 % — AB (ref 36.0–46.0)
Hemoglobin: 11 g/dL — ABNORMAL LOW (ref 12.0–15.0)
MCH: 28.8 pg (ref 26.0–34.0)
MCHC: 32.1 g/dL (ref 30.0–36.0)
MCV: 89.8 fL (ref 78.0–100.0)
PLATELETS: 501 10*3/uL — AB (ref 150–400)
RBC: 3.82 MIL/uL — AB (ref 3.87–5.11)
RDW: 13.8 % (ref 11.5–15.5)
WBC: 14.9 10*3/uL — AB (ref 4.0–10.5)

## 2016-02-15 LAB — BASIC METABOLIC PANEL
ANION GAP: 8 (ref 5–15)
BUN: <5 mg/dL — ABNORMAL LOW (ref 6–20)
CO2: 28 mmol/L (ref 22–32)
Calcium: 8.1 mg/dL — ABNORMAL LOW (ref 8.9–10.3)
Chloride: 97 mmol/L — ABNORMAL LOW (ref 101–111)
Creatinine, Ser: 0.63 mg/dL (ref 0.44–1.00)
GLUCOSE: 116 mg/dL — AB (ref 65–99)
POTASSIUM: 3.1 mmol/L — AB (ref 3.5–5.1)
Sodium: 133 mmol/L — ABNORMAL LOW (ref 135–145)

## 2016-02-15 LAB — MAGNESIUM: Magnesium: 2 mg/dL (ref 1.7–2.4)

## 2016-02-15 MED ORDER — POTASSIUM CHLORIDE CRYS ER 20 MEQ PO TBCR
40.0000 meq | EXTENDED_RELEASE_TABLET | Freq: Once | ORAL | Status: AC
  Administered 2016-02-15: 40 meq via ORAL
  Filled 2016-02-15: qty 2

## 2016-02-15 MED ORDER — HYDROCODONE-ACETAMINOPHEN 10-325 MG PO TABS
1.0000 | ORAL_TABLET | ORAL | Status: DC | PRN
  Administered 2016-02-15: 1 via ORAL
  Administered 2016-02-16: 2 via ORAL
  Administered 2016-02-17 (×2): 1 via ORAL
  Filled 2016-02-15: qty 1
  Filled 2016-02-15 (×2): qty 2
  Filled 2016-02-15: qty 1

## 2016-02-15 MED ORDER — HYDROMORPHONE HCL 1 MG/ML IJ SOLN
1.0000 mg | INTRAMUSCULAR | Status: DC | PRN
  Administered 2016-02-15 – 2016-02-18 (×7): 1 mg via INTRAVENOUS
  Filled 2016-02-15 (×7): qty 1

## 2016-02-15 MED ORDER — POTASSIUM CHLORIDE 10 MEQ/100ML IV SOLN
10.0000 meq | INTRAVENOUS | Status: AC
  Administered 2016-02-15: 10 meq via INTRAVENOUS
  Filled 2016-02-15: qty 100

## 2016-02-15 NOTE — Progress Notes (Signed)
PROGRESS NOTE    Sue [name]  [id number] DOB: 10-06-1950 DOA: 02/11/2016 PCP: Walker Kehr, MD   Brief Narrative:  Sue [name] is an 65 y.o. female with a history of HTN admitted by the surgical service due to diverticulitis who underwent surgical treatment with hartman's Procedure on 5/31, and for whom medical consultation has been requested due to uncontrolled hypertension.   Assessment & Plan   Essential hypertension -Patient was using Toprol as well as HCTZ and triamterene at home -She was nothing by mouth due to her recent surgery however was restarted on clear liquids, which she is tolerating  Mmetoprolol PO 12.5mg  BID and maxzide  Perforated diverticulum and large intestine/ileus -Per primary service, general surgery -Continue cefepime and Flagyl  Hypokalemia -Given KCl 75mEq orally followed by IV supplementation  -Continue to monitor BMP -Will add on magnesium level   Normocytic anemia -Hemoglobin dropped to 11, baseline around 12 -Likely dilutional vs surgery -Continue to monitor CBC  DVT Prophylaxis  Lovenox  Code Status: Full  Family Communication: None at bedside  Disposition Plan: Admitted, per surgery  Consultants [place]  Procedures  laparotomy, partial colectomy and colostomy (Hartman's Procedure), 02/11/16, Dr. Kieth Brightly  Antibiotics   Anti-infectives    Start     Dose/Rate Route Frequency Ordered Stop   02/12/16 0100  metroNIDAZOLE (FLAGYL) IVPB 500 mg     500 mg 100 mL/hr over 60 Minutes Intravenous Every 8 hours 02/11/16 2150     02/12/16 0000  ceFEPIme (MAXIPIME) 2 g in dextrose 5 % 50 mL IVPB     2 g 100 mL/hr over 30 Minutes Intravenous Every 8 hours 02/11/16 2150     02/11/16 1645  ceFEPIme (MAXIPIME) 2 g in dextrose 5 % 50 mL IVPB     2 g 100 mL/hr over 30 Minutes Intravenous  Once 02/11/16 1644 02/11/16 1809   02/11/16 1645  metroNIDAZOLE (FLAGYL) IVPB 500 mg     500 mg 100 mL/hr over 60 Minutes Intravenous  Once  02/11/16 1644 02/11/16 1809      Subjective:   Sue [name] seen and examined today.  Patient complains about being hooked up to IV lines and monitoring.  Did not sleep well due to constant beeping. Denies chest pain, shortness of breath, nausea or vomiting.  Objective:   Filed Vitals:   02/15/16 0030 02/15/16 0152 02/15/16 0400 02/15/16 0634  BP:  154/79  161/74  Pulse:  74  70  Temp:  98.7 F (37.1 C)  98.7 F (37.1 C)  TempSrc:      Resp: 17 17 12 16   Height:      Weight:      SpO2: 100% 100% 97% 98%    Intake/Output Summary (Last 24 hours) at 02/15/16 1131 Last data filed at 02/15/16 [id number]  Gross per 24 hour  Intake 2387.5 ml  Output   2775 ml  Net -387.5 ml   Filed Weights   02/11/16 1629 02/11/16 2203  Weight: 57.153 kg (126 lb) 57.153 kg (126 lb)    Exam  General: Well developed, well nourished, no distress  HEENT: NCAT,mucous membranes moist.   Cardiovascular: S1 S2 auscultated, no murmurs, RRR  Respiratory: Clear to auscultation bilaterally   Abdomen: Soft, diffusely TTP, nondistended, + bowel sounds,+ ostomy  Extremities: warm dry without cyanosis clubbing or edema  Neuro: AAOx3, nonfocal  Psych: Normal affect and demeanor, pleasant  Data Reviewed: I have personally reviewed following labs and imaging studies  CBC:  Recent Labs Lab  02/11/16 1639 02/12/16 0320 02/13/16 0514 02/14/16 0608 02/15/16 0401  WBC 22.1* 29.7* 17.8* 15.3* 14.9*  NEUTROABS 19.4*  --   --   --   --   HGB 12.2 12.7 10.4* 10.8* 11.0*  HCT 36.8 38.2 31.7* 33.8* 34.3*  MCV 88.7 90.7 90.1 90.9 89.8  PLT 453* 477* 425* 448* [id number]*   Basic Metabolic Panel:  Recent Labs Lab 02/11/16 1639 02/12/16 0320 02/13/16 0514 02/14/16 0608 02/15/16 0401  NA 133* 135 134* 135 133*  K 3.2* 3.8 4.3 3.6 3.1*  CL 94* 100* 100* 101 97*  CO2 26 25 27 26 28   GLUCOSE 132* 184* 116* 126* 116*  BUN 10 10 13 8  <5*  CREATININE 0.79 0.74 0.66 0.62 0.63  CALCIUM 8.9 8.2* 7.9*  8.2* 8.1*   GFR: Estimated Creatinine Clearance: 58.9 mL/min (by C-G formula based on Cr of 0.63). Liver Function Tests:  Recent Labs Lab 02/11/16 1639  AST 44*  ALT 76*  ALKPHOS 158*  BILITOT 0.4  PROT 7.0  ALBUMIN 2.8*    Recent Labs Lab 02/11/16 1639  LIPASE 28   No results for input(s): AMMONIA in the last 168 hours. Coagulation Profile: No results for input(s): INR, PROTIME in the last 168 hours. Cardiac Enzymes: No results for input(s): CKTOTAL, CKMB, CKMBINDEX, TROPONINI in the last 168 hours. BNP (last 3 results) No results for input(s): PROBNP in the last 8760 hours. HbA1C: No results for input(s): HGBA1C in the last 72 hours. CBG: No results for input(s): GLUCAP in the last 168 hours. Lipid Profile: No results for input(s): CHOL, HDL, LDLCALC, TRIG, CHOLHDL, LDLDIRECT in the last 72 hours. Thyroid Function Tests: No results for input(s): TSH, T4TOTAL, FREET4, T3FREE, THYROIDAB in the last 72 hours. Anemia Panel: No results for input(s): VITAMINB12, FOLATE, FERRITIN, TIBC, IRON, RETICCTPCT in the last 72 hours. Urine analysis:    Component Value Date/Time   COLORURINE YELLOW 02/02/2016 [place] 02/02/2016 1233   LABSPEC 1.015 02/02/2016 1233   PHURINE 6.5 02/02/2016 1233   GLUCOSEU NEGATIVE 02/02/2016 1233   HGBUR NEGATIVE 02/02/2016 1233   BILIRUBINUR NEGATIVE 02/02/2016 1233   KETONESUR NEGATIVE 02/02/2016 1233   UROBILINOGEN 0.2 02/02/2016 1233   NITRITE NEGATIVE 02/02/2016 1233   LEUKOCYTESUR NEGATIVE 02/02/2016 1233   Sepsis Labs: @LABRCNTIP (procalcitonin:4,lacticidven:4)  )No results found for this or any previous visit (from the past 240 hour(s)).    Radiology Studies: No results found.   Scheduled Meds: . ceFEPime (MAXIPIME) IV  2 g Intravenous Q8H   And  . metronidazole  500 mg Intravenous Q8H  . enoxaparin (LOVENOX) injection  40 mg Subcutaneous Q24H  . metoprolol succinate  12.5 mg Oral BID  .  Phosphatidylserine-DHA-EPA  1 capsule Oral QHS  . potassium chloride  10 mEq Intravenous Q1 Hr x 5  . triamterene-hydrochlorothiazide  1 tablet Oral Daily   Continuous Infusions: . dextrose 5 % and 0.45 % NaCl with KCl 20 mEq/L Stopped (02/14/16 1723)     LOS: 4 days   Time Spent in minutes   30 minutes  Dejae Bernet D.O. on 02/15/2016 at 11:31 AM  Between 7am to 7pm - Pager - [phone]  After 7pm go to [email or web address] - password [email or web address]  And look for the night coverage person covering for me after hours  [facility] Office  [phone]

## 2016-02-15 NOTE — Progress Notes (Addendum)
4 Days Post-Op  Subjective: Stable and alert. Complains of numerous tubes, wires and lines.... We're going to discontinue the PCA and discontinue telemetry and that should help. Tolerating clear liquids but no stool or flatus. Voiding without difficulty Pain seems well controlled Ambulatory  Potassium 3.1.(will suppliment IV)  Creatinine 0.63.  WBC 14,900.  Hemoglobin 11.0.  Objective: Vital signs in last 24 hours: Temp:  [98.7 F (37.1 C)-99.9 F (37.7 C)] 98.7 F (37.1 C) (06/04 0634) Pulse Rate:  [70-81] 70 (06/04 0634) Resp:  [12-20] 16 (06/04 0634) BP: (153-167)/(74-93) 161/74 mmHg (06/04 0634) SpO2:  [97 %-100 %] 98 % (06/04 0634) Last BM Date: 02/10/16  Intake/Output from previous day: 06/03 0701 - 06/04 0700 In: 2767.5 [P.O.:1020; I.V.:1447.5; IV Piggyback:300] Out: 2925 [Urine:2900; Drains:25] Intake/Output this shift:    General appearance: Alert.  Pleasant.  Cooperative.  Frustrated by all the support tubes and lines. Resp: clear to auscultation bilaterally GI: Soft.  Still a little distended and tympanitic.  No air in bag.  Stoma pink.  Wound packed open and clean without unusual drainage.  JP serosanguineous.  Occasional bowel sounds.  Lab Results:  Results for orders placed or performed during the hospital encounter of 02/11/16 (from the past 24 hour(s))  CBC     Status: Abnormal   Collection Time: 02/15/16  4:01 AM  Result Value Ref Range   WBC 14.9 (H) 4.0 - 10.5 K/uL   RBC 3.82 (L) 3.87 - 5.11 MIL/uL   Hemoglobin 11.0 (L) 12.0 - 15.0 g/dL   HCT 34.3 (L) 36.0 - 46.0 %   MCV 89.8 78.0 - 100.0 fL   MCH 28.8 26.0 - 34.0 pg   MCHC 32.1 30.0 - 36.0 g/dL   RDW 13.8 11.5 - 15.5 %   Platelets 501 (H) 150 - 400 K/uL  Basic metabolic panel     Status: Abnormal   Collection Time: 02/15/16  4:01 AM  Result Value Ref Range   Sodium 133 (L) 135 - 145 mmol/L   Potassium 3.1 (L) 3.5 - 5.1 mmol/L   Chloride 97 (L) 101 - 111 mmol/L   CO2 28 22 - 32 mmol/L   Glucose, Bld 116 (H) 65 - 99 mg/dL   BUN <5 (L) 6 - 20 mg/dL   Creatinine, Ser 0.63 0.44 - 1.00 mg/dL   Calcium 8.1 (L) 8.9 - 10.3 mg/dL   GFR calc non Af Amer >60 >60 mL/min   GFR calc Af Amer >60 >60 mL/min   Anion gap 8 5 - 15     Studies/Results: No results found.  Marland Kitchen ceFEPime (MAXIPIME) IV  2 g Intravenous Q8H   And  . metronidazole  500 mg Intravenous Q8H  . enoxaparin (LOVENOX) injection  40 mg Subcutaneous Q24H  . metoprolol succinate  12.5 mg Oral BID  . Phosphatidylserine-DHA-EPA  1 capsule Oral QHS  . potassium chloride  10 mEq Intravenous Q1 Hr x 5  . potassium chloride  40 mEq Oral Once  . triamterene-hydrochlorothiazide  1 tablet Oral Daily     Assessment/Plan: s/p Procedure(s): PARTIAL COLECTOMY with colostomy   Perforated diverticulitis POD#4 - laparotomy, partial colectomy and colostomy (Hartman's Procedure), 02/11/16, Dr. Kieth Brightly Still has ileus, not unexpected Continue clear liquids only Discontinue telemetry Discontinue PCA and use alternative analgesics I told her she can have hard candy and chewing gum  Hypokalemia.  I have ordered 5 runs of KCl and check be met tomorrow  Hypertension (maxide and toprol XL at home), appreciate triad assist.  Resume home meds   FEN: Stay on clears. Told pt to take slow/IV fluids PULM: oob, ambulate in halls, IS... Needs to increase ambulation in hall today. She says she can do that. GI: start clears, start ostomy teaching. Will need HHN at discharge for ostomy support, wound ID: day 4 cefepime/metronidazole  DVT: Lovenox/SCD  @PROBHOSP @  LOS: 4 days    Sue [name] M 02/15/2016  . .prob

## 2016-02-16 LAB — CBC
HEMATOCRIT: 33.3 % — AB (ref 36.0–46.0)
HEMOGLOBIN: 10.7 g/dL — AB (ref 12.0–15.0)
MCH: 28.8 pg (ref 26.0–34.0)
MCHC: 32.1 g/dL (ref 30.0–36.0)
MCV: 89.8 fL (ref 78.0–100.0)
Platelets: 563 10*3/uL — ABNORMAL HIGH (ref 150–400)
RBC: 3.71 MIL/uL — AB (ref 3.87–5.11)
RDW: 13.6 % (ref 11.5–15.5)
WBC: 12.9 10*3/uL — AB (ref 4.0–10.5)

## 2016-02-16 LAB — BASIC METABOLIC PANEL
ANION GAP: 9 (ref 5–15)
CO2: 31 mmol/L (ref 22–32)
Calcium: 8.2 mg/dL — ABNORMAL LOW (ref 8.9–10.3)
Chloride: 96 mmol/L — ABNORMAL LOW (ref 101–111)
Creatinine, Ser: 0.6 mg/dL (ref 0.44–1.00)
Glucose, Bld: 115 mg/dL — ABNORMAL HIGH (ref 65–99)
POTASSIUM: 3.2 mmol/L — AB (ref 3.5–5.1)
SODIUM: 136 mmol/L (ref 135–145)

## 2016-02-16 MED ORDER — POTASSIUM CHLORIDE CRYS ER 20 MEQ PO TBCR
40.0000 meq | EXTENDED_RELEASE_TABLET | Freq: Two times a day (BID) | ORAL | Status: AC
Start: 2016-02-16 — End: 2016-02-16
  Administered 2016-02-16 (×2): 40 meq via ORAL
  Filled 2016-02-16 (×3): qty 2

## 2016-02-16 NOTE — Consult Note (Addendum)
WOC ostomy follow up Surgical team following for assessment and plan of care to abd wound. Stoma type/location: Colostomy stoma to LLQ Stomal assessment/size: 2 inches, edematous, red and viable, above skin level Peristomal assessment: Intact skin surrounding Output: No stool, mod amt flatus in the pouch Ostomy pouching: 2pc.  Education provided: Pt participated in pouch application and emptying in a limited fashion; she states she is still not ready to look at the stoma but wants to be informed on the pouch change process and emptying and she is able to open and close the velcro to empty. Discussed pouching routines and she asks appropriate questions. Supplies and educational materials at the bedside. She feels that she will be able to perform the pouch changes at home, "when she has time to get her mind around the process, since she is very squeemish."  Reviewed ordering pouches and demonstrated use of closed end pouches for use after discharge, which she agrees would be her preference.  Free samples provided for use and also will be in the [place] delivery.  She denies further questions at this time. Enrolled patient in [place] Start Discharge program: Yes Please re-consult if further assistance is needed. Thank-you,  Julien Girt MSN, [place], [place], [place], [place]

## 2016-02-16 NOTE — Progress Notes (Signed)
[facility] Progress Note  5 Days Post-Op  Subjective: Laying in bed in NAD. States she did not eat at all yesterday because she does not like the liquid diet and is nauseated by it. All she has had to eat today is half of a popsicle. Requesting sweet tea and toast or cheerios. States abdominal pain is mild, 2/10 on IV Dilaudid. Urinating without hesitancy, passing flatus in to ostomy, no stool output. Did ambulate more yesterday down the hall and back, but had not had pain medications and became nauseated due to pain. No emesis.   Objective: Vital signs in last 24 hours: Temp:  [98.7 F (37.1 C)-99.3 F (37.4 C)] 98.7 F (37.1 C) (06/05 0553) Pulse Rate:  [72-85] 72 (06/05 0553) Resp:  [17-18] 17 (06/05 0553) BP: (153-166)/(76-91) 166/76 mmHg (06/05 0553) SpO2:  [97 %-98 %] 97 % (06/05 0553) Last BM Date: 02/10/16  Intake/Output from previous day: 06/04 0701 - 06/05 0700 In: 2717 [P.O.:840; I.V.:1777; IV Piggyback:100] Out: [id number] [Urine:3250; Drains:15] Intake/Output this shift:    PE: Gen:  Alert, NAD, pleasant Card:  RRR, no M/G/R heard Pulm:  CTA, no W/R/R Abd: Soft, mildly tender, distended, hypoactive bowel sounds, air in ostomy bag, wound packed and covered with ABD pads. JP with very minimal serosanguinous drainage.   Lab Results:   Recent Labs  02/15/16 0401 02/16/16 0555  WBC 14.9* 12.9*  HGB 11.0* 10.7*  HCT 34.3* 33.3*  PLT 501* 563*   BMET  Recent Labs  02/15/16 0401 02/16/16 0555  NA 133* 136  K 3.1* 3.2*  CL 97* 96*  CO2 28 31  GLUCOSE 116* 115*  BUN <5* <5*  CREATININE 0.63 0.60  CALCIUM 8.1* 8.2*   PT/INR No results for input(s): LABPROT, INR in the last 72 hours. CMP     Component Value Date/Time   NA 136 02/16/2016 0555   K 3.2* 02/16/2016 0555   CL 96* 02/16/2016 0555   CO2 31 02/16/2016 0555   GLUCOSE 115* 02/16/2016 0555   GLUCOSE 95 09/08/2006 1036   BUN <5* 02/16/2016 0555   CREATININE 0.60 02/16/2016 0555    CALCIUM 8.2* 02/16/2016 0555   PROT 7.0 02/11/2016 1639   ALBUMIN 2.8* 02/11/2016 1639   AST 44* 02/11/2016 1639   ALT 76* 02/11/2016 1639   ALKPHOS 158* 02/11/2016 1639   BILITOT 0.4 02/11/2016 1639   GFRNONAA >60 02/16/2016 0555   GFRAA >60 02/16/2016 0555   Lipase     Component Value Date/Time   LIPASE 28 02/11/2016 1639       Studies/Results: No results found.  Anti-infectives: Anti-infectives    Start     Dose/Rate Route Frequency Ordered Stop   02/12/16 0100  metroNIDAZOLE (FLAGYL) IVPB 500 mg     500 mg 100 mL/hr over 60 Minutes Intravenous Every 8 hours 02/11/16 2150     02/12/16 0000  ceFEPIme (MAXIPIME) 2 g in dextrose 5 % 50 mL IVPB     2 g 100 mL/hr over 30 Minutes Intravenous Every 8 hours 02/11/16 2150     02/11/16 1645  ceFEPIme (MAXIPIME) 2 g in dextrose 5 % 50 mL IVPB     2 g 100 mL/hr over 30 Minutes Intravenous  Once 02/11/16 1644 02/11/16 1809   02/11/16 1645  metroNIDAZOLE (FLAGYL) IVPB 500 mg     500 mg 100 mL/hr over 60 Minutes Intravenous  Once 02/11/16 1644 02/11/16 1809       Assessment/Plan POD#5 Partial colectomy with colostomy (  Hartman's) on 02/11/16 by Dr. Kieth Brightly Perforated diverticulitis Still has ileus, not unexpected Continue daily wet-to-dry; nurse to page me during dressing change today. Pain control: IV Dilaudid and PO NORCO. Encourage orals Will discuss d/c-ing JP drain with Dr. Brantley Stage OOB and IS  Hypokalemia- 3.2 today from 3.1 yesterday.   - received 1 run of KCl yesterday and 40 mEq PO BID today. BMP for AM ordered.  Leukocytosis- Trending down - 12.9 today, 14.9 yesterday Hypertension- (maxide and toprol XL at home), appreciate triad assist. Resume home meds   FEN: full liquids then as tolerated ID: day 5 cefepime/metronidazole  DVT: Lovenox/SCD Dispo: Floor, Will need HHN at discharge for ostomy support, wound   LOS: 5 days    Jill Alexanders , [facility] Surgery 02/16/2016, 8:03 AM Pager:  [phone] Mon-Fri 7:00 am-4:30 pm Sat-Sun 7:00 am-11:30 am

## 2016-02-16 NOTE — Care Management Note (Signed)
Case Management Note  Patient Details  Name: Sue [name] MRN: [place]:[id number] Date of Birth: [birth year]  Subjective/Objective:                    Action/Plan:  Confirmed face sheet information with patient . Discussed home health RN with patient . Patient requested NCM to call Marnee Guarneri [phone]. Patient states Ms Evelina Bucy works at [employer] and she wants to be involved in setting up home health . Left voice mail for Ms Evelina Bucy . Awaiting call back. Patient aware. Left list of home health agencies for [facility] with patient . Will continue to follow. Will need MD order for home health with wound care instructions.  Expected Discharge Date:                  Expected Discharge Plan:  [place]  In-House Referral:     Discharge planning Services  CM Consult  Post Acute Care Choice:    Choice offered to:  Patient  DME Arranged:    DME Agency:     HH Arranged:    [place] Agency:     Status of Service:  In process, will continue to follow  Medicare Important Message Given:  Yes Date Medicare IM Given:    Medicare IM give by:    Date Additional Medicare IM Given:    Additional Medicare Important Message give by:     If discussed at [place] of Stay Meetings, dates discussed:    Additional Comments:  Marilu Favre, RN 02/16/2016, 10:38 AM

## 2016-02-16 NOTE — Progress Notes (Signed)
PROGRESS NOTE    Sue [name]  [id number] DOB: 1951/06/11 DOA: 02/11/2016 PCP: Walker Kehr, MD   Brief Narrative:  Sue [name] is an 65 y.o. female with a history of HTN admitted by the surgical service due to diverticulitis who underwent surgical treatment with hartman's Procedure on 5/31, and for whom medical consultation has been requested due to uncontrolled hypertension.   Assessment & Plan   Essential hypertension -Patient was using Toprol as well as HCTZ and triamterene at home -She was nothing by mouth due to her recent surgery however was restarted on clear liquids, which she is tolerating  -Continue metoprolol PO 12.5mg  BID and maxzide  Perforated diverticulum and large intestine/ileus -Per primary service, general surgery -Continue cefepime and Flagyl  Hypokalemia -Given KCl 65mEq orally  -Continue to monitor BMP -Magnesium 2  Normocytic anemia -Hemoglobin dropped to 10.7, baseline around 12 -Likely dilutional vs surgery -Continue to monitor CBC  DVT Prophylaxis  Lovenox  Code Status: Full  Family Communication: None at bedside  Disposition Plan: Admitted, per surgery  Consultants [place]  Procedures  laparotomy, partial colectomy and colostomy (Hartman's Procedure), 02/11/16, Dr. Kieth Brightly  Antibiotics   Anti-infectives    Start     Dose/Rate Route Frequency Ordered Stop   02/12/16 0100  metroNIDAZOLE (FLAGYL) IVPB 500 mg     500 mg 100 mL/hr over 60 Minutes Intravenous Every 8 hours 02/11/16 2150     02/12/16 0000  ceFEPIme (MAXIPIME) 2 g in dextrose 5 % 50 mL IVPB     2 g 100 mL/hr over 30 Minutes Intravenous Every 8 hours 02/11/16 2150     02/11/16 1645  ceFEPIme (MAXIPIME) 2 g in dextrose 5 % 50 mL IVPB     2 g 100 mL/hr over 30 Minutes Intravenous  Once 02/11/16 1644 02/11/16 1809   02/11/16 1645  metroNIDAZOLE (FLAGYL) IVPB 500 mg     500 mg 100 mL/hr over 60 Minutes Intravenous  Once 02/11/16 1644 02/11/16 1809       Subjective:   Sue [name] seen and examined today.  Patient stated she did not use a IV pain meds yesterday and felt pain, nausea.  She does not want to eat/drink liquids, she wishes ot have a piece of toast.  Currently denies chest pain, shortness of breath, nausea or vomiting. Has had gas.  Objective:   Filed Vitals:   02/15/16 0800 02/15/16 1401 02/15/16 2214 02/16/16 0553  BP:  153/79 154/91 166/76  Pulse:  85 72 72  Temp:  99.3 F (37.4 C) 99 F (37.2 C) 98.7 F (37.1 C)  TempSrc:  Oral Oral   Resp: 18 17 18 17   Height:      Weight:      SpO2:  97% 98% 97%    Intake/Output Summary (Last 24 hours) at 02/16/16 1133 Last data filed at 02/16/16 0600  Gross per 24 hour  Intake   2597 ml  Output   3265 ml  Net   -668 ml   Filed Weights   02/11/16 1629 02/11/16 2203  Weight: 57.153 kg (126 lb) 57.153 kg (126 lb)    Exam  General: Well developed, well nourished, no distress  HEENT: NCAT,mucous membranes moist.   Cardiovascular: S1 S2 auscultated, no murmurs, RRR  Respiratory: Clear to auscultation bilaterally   Abdomen: Soft, diffusely TTP, nondistended, + bowel sounds,+ ostomy  Extremities: warm dry without cyanosis clubbing or edema  Neuro: AAOx3, nonfocal  Psych: Normal affect and demeanor  Data Reviewed:  I have personally reviewed following labs and imaging studies  CBC:  Recent Labs Lab 02/11/16 1639 02/12/16 0320 02/13/16 0514 02/14/16 0608 02/15/16 0401 02/16/16 0555  WBC 22.1* 29.7* 17.8* 15.3* 14.9* 12.9*  NEUTROABS 19.4*  --   --   --   --   --   HGB 12.2 12.7 10.4* 10.8* 11.0* 10.7*  HCT 36.8 38.2 31.7* 33.8* 34.3* 33.3*  MCV 88.7 90.7 90.1 90.9 89.8 89.8  PLT 453* 477* 425* 448* 501* [id number]*   Basic Metabolic Panel:  Recent Labs Lab 02/12/16 0320 02/13/16 0514 02/14/16 0608 02/15/16 0401 02/15/16 1250 02/16/16 0555  NA 135 134* 135 133*  --  136  K 3.8 4.3 3.6 3.1*  --  3.2*  CL 100* 100* 101 97*  --  96*  CO2 25 27  26 28   --  31  GLUCOSE 184* 116* 126* 116*  --  115*  BUN 10 13 8  <5*  --  <5*  CREATININE 0.74 0.66 0.62 0.63  --  0.60  CALCIUM 8.2* 7.9* 8.2* 8.1*  --  8.2*  MG  --   --   --   --  2.0  --    GFR: Estimated Creatinine Clearance: 58.9 mL/min (by C-G formula based on Cr of 0.6). Liver Function Tests:  Recent Labs Lab 02/11/16 1639  AST 44*  ALT 76*  ALKPHOS 158*  BILITOT 0.4  PROT 7.0  ALBUMIN 2.8*    Recent Labs Lab 02/11/16 1639  LIPASE 28   No results for input(s): AMMONIA in the last 168 hours. Coagulation Profile: No results for input(s): INR, PROTIME in the last 168 hours. Cardiac Enzymes: No results for input(s): CKTOTAL, CKMB, CKMBINDEX, TROPONINI in the last 168 hours. BNP (last 3 results) No results for input(s): PROBNP in the last 8760 hours. HbA1C: No results for input(s): HGBA1C in the last 72 hours. CBG: No results for input(s): GLUCAP in the last 168 hours. Lipid Profile: No results for input(s): CHOL, HDL, LDLCALC, TRIG, CHOLHDL, LDLDIRECT in the last 72 hours. Thyroid Function Tests: No results for input(s): TSH, T4TOTAL, FREET4, T3FREE, THYROIDAB in the last 72 hours. Anemia Panel: No results for input(s): VITAMINB12, FOLATE, FERRITIN, TIBC, IRON, RETICCTPCT in the last 72 hours. Urine analysis:    Component Value Date/Time   COLORURINE YELLOW 02/02/2016 [place] 02/02/2016 1233   LABSPEC 1.015 02/02/2016 1233   PHURINE 6.5 02/02/2016 1233   GLUCOSEU NEGATIVE 02/02/2016 1233   HGBUR NEGATIVE 02/02/2016 1233   BILIRUBINUR NEGATIVE 02/02/2016 1233   KETONESUR NEGATIVE 02/02/2016 1233   UROBILINOGEN 0.2 02/02/2016 1233   NITRITE NEGATIVE 02/02/2016 1233   LEUKOCYTESUR NEGATIVE 02/02/2016 1233   Sepsis Labs: @LABRCNTIP (procalcitonin:4,lacticidven:4)  )No results found for this or any previous visit (from the past 240 hour(s)).    Radiology Studies: No results found.   Scheduled Meds: . ceFEPime (MAXIPIME) IV  2 g  Intravenous Q8H   And  . metronidazole  500 mg Intravenous Q8H  . enoxaparin (LOVENOX) injection  40 mg Subcutaneous Q24H  . metoprolol succinate  12.5 mg Oral BID  . Phosphatidylserine-DHA-EPA  1 capsule Oral QHS  . potassium chloride  40 mEq Oral BID  . triamterene-hydrochlorothiazide  1 tablet Oral Daily   Continuous Infusions: . dextrose 5 % and 0.45 % NaCl with KCl 20 mEq/L 50 mL/hr at 02/16/16 1021     LOS: 5 days   Time Spent in minutes   30 minutes  Sue [name] D.O. on  02/16/2016 at 11:33 AM  Between 7am to 7pm - Pager - [phone]  After 7pm go to [email or web address] - password [email or web address]  And look for the night coverage person covering for me after hours  [facility] Office  [phone]

## 2016-02-16 NOTE — Care Management Important Message (Signed)
Important Message  Patient Details  Name: Sue [name] MRN: [place]:[id number] Date of Birth: 29-Oct-1950   Medicare Important Message Given:  Yes    [name] 02/16/2016, 12:09 PM

## 2016-02-17 LAB — BASIC METABOLIC PANEL
Anion gap: 10 (ref 5–15)
BUN: 5 mg/dL — ABNORMAL LOW (ref 6–20)
CALCIUM: 8.4 mg/dL — AB (ref 8.9–10.3)
CO2: 30 mmol/L (ref 22–32)
CREATININE: 0.6 mg/dL (ref 0.44–1.00)
Chloride: 98 mmol/L — ABNORMAL LOW (ref 101–111)
GFR calc non Af Amer: >60 mL/min (ref >60)
GLUCOSE: 128 mg/dL — AB (ref 65–99)
Potassium: 4.2 mmol/L (ref 3.5–5.1)
Sodium: 138 mmol/L (ref 135–145)

## 2016-02-17 LAB — CBC
HCT: 32.2 % — ABNORMAL LOW (ref 36.0–46.0)
Hemoglobin: 10.2 g/dL — ABNORMAL LOW (ref 12.0–15.0)
MCH: 28.5 pg (ref 26.0–34.0)
MCHC: 31.7 g/dL (ref 30.0–36.0)
MCV: 89.9 fL (ref 78.0–100.0)
PLATELETS: 583 10*3/uL — AB (ref 150–400)
RBC: 3.58 MIL/uL — ABNORMAL LOW (ref 3.87–5.11)
RDW: 13.7 % (ref 11.5–15.5)
WBC: 11.8 10*3/uL — ABNORMAL HIGH (ref 4.0–10.5)

## 2016-02-17 MED ORDER — METRONIDAZOLE 500 MG PO TABS: 500.0000 mg | ORAL_TABLET | Freq: Three times a day (TID) | ORAL | Status: AC

## 2016-02-17 MED ORDER — HYDROCODONE-ACETAMINOPHEN 10-325 MG PO TABS: 1.0000 | ORAL_TABLET | Freq: Four times a day (QID) | ORAL | Status: DC | PRN

## 2016-02-17 MED ORDER — SULFAMETHOXAZOLE-TRIMETHOPRIM 800-160 MG PO TABS: 1.0000 | ORAL_TABLET | Freq: Two times a day (BID) | ORAL | Status: AC

## 2016-02-17 NOTE — Progress Notes (Signed)
[facility] Progress Note  6 Days Post-Op  Subjective: Sitting up in chair, NAD, just had a pancake for breakfast. Tolerating PO. Pain controlled on PO paid meds. Ambulating. Air in ostomy bag, no stool. Urinating without hesitancy.  Requests to see Dr. Kieth Brightly prior to discharge - informed Dr. Raliegh Ip is out of town and would see her in clinic. JP output: 100mL/24h serosanguinous  Objective: Vital signs in last 24 hours: Temp:  [97.6 F (36.4 C)-98.7 F (37.1 C)] 98.7 F (37.1 C) (06/06 0440) Pulse Rate:  [66-75] 66 (06/06 0440) Resp:  [17-18] 18 (06/06 0440) BP: (144-149)/(71-73) 144/71 mmHg (06/06 0440) SpO2:  [96 %-98 %] 96 % (06/06 0440) Last BM Date: 02/10/16  Intake/Output from previous day: 06/05 0701 - 06/06 0700 In: 72 [P.O.:240; I.V.:350] Out: 1715 [Urine:1700; Drains:15] Intake/Output this shift:    PE: Gen: Alert, NAD, pleasant Card: RRR, no M/G/R heard Pulm: CTA, no W/R/R Abd: Soft, mildly tender, distended, +BS bowel sounds, air in ostomy bag, wound packed and covered with ABD pads. JP with very minimal serosanguinous drainage.  Lab Results:   Recent Labs  02/16/16 0555 02/17/16 0630  WBC 12.9* 11.8*  HGB 10.7* 10.2*  HCT 33.3* 32.2*  PLT 563* 583*   BMET  Recent Labs  02/16/16 0555 02/17/16 0630  NA 136 138  K 3.2* 4.2  CL 96* 98*  CO2 31 30  GLUCOSE 115* 128*  BUN <5* 5*  CREATININE 0.60 0.60  CALCIUM 8.2* 8.4*   PT/INR No results for input(s): LABPROT, INR in the last 72 hours. CMP     Component Value Date/Time   NA 138 02/17/2016 0630   K 4.2 02/17/2016 0630   CL 98* 02/17/2016 0630   CO2 30 02/17/2016 0630   GLUCOSE 128* 02/17/2016 0630   GLUCOSE 95 09/08/2006 1036   BUN 5* 02/17/2016 0630   CREATININE 0.60 02/17/2016 0630   CALCIUM 8.4* 02/17/2016 0630   PROT 7.0 02/11/2016 1639   ALBUMIN 2.8* 02/11/2016 1639   AST 44* 02/11/2016 1639   ALT 76* 02/11/2016 1639   ALKPHOS 158* 02/11/2016 1639   BILITOT 0.4  02/11/2016 1639   GFRNONAA >60 02/17/2016 0630   GFRAA >60 02/17/2016 0630   Lipase     Component Value Date/Time   LIPASE 28 02/11/2016 1639       Studies/Results: No results found.  Anti-infectives: Anti-infectives    Start     Dose/Rate Route Frequency Ordered Stop   02/12/16 0100  metroNIDAZOLE (FLAGYL) IVPB 500 mg     500 mg 100 mL/hr over 60 Minutes Intravenous Every 8 hours 02/11/16 2150     02/12/16 0000  ceFEPIme (MAXIPIME) 2 g in dextrose 5 % 50 mL IVPB     2 g 100 mL/hr over 30 Minutes Intravenous Every 8 hours 02/11/16 2150     02/11/16 1645  ceFEPIme (MAXIPIME) 2 g in dextrose 5 % 50 mL IVPB     2 g 100 mL/hr over 30 Minutes Intravenous  Once 02/11/16 1644 02/11/16 1809   02/11/16 1645  metroNIDAZOLE (FLAGYL) IVPB 500 mg     500 mg 100 mL/hr over 60 Minutes Intravenous  Once 02/11/16 1644 02/11/16 1809      Assessment/Plan POD#5 Partial colectomy with colostomy (Hartman's) on 02/11/16 by Dr. Kieth Brightly Perforated diverticulitis - plan for discharge tomorrow, pending home health arrangements; HH/face-to-face ordered; Pt concerned she needs 24 hour supervision at home and concerned about ambulating up and down stairs, will continue pt education  regarding physical limitation/abilities. PT/OT to consult patient and give recommendations. - Continue BID wet-to-dry - Pain control:PO NORCO. Encourage orals - likely d/c JP drain at discharge  - OOB and IS  Hypokalemia- 4.1, resolved Leukocytosis- Trending down - 11.8 today Hypertension- appreciate triad assist. Resume home meds (metoprolol, maxzide)  FEN: soft then as tolerated ID: day 6 cefepime/metronidazole  DVT: Lovenox/SCD Dispo: Floor, d/c tomorrow with HH for ostomy support, wound care   LOS: 6 days    Jill Alexanders , [facility] 02/17/2016, 10:36 AM Pager: [phone] Mon-Fri 7:00 am-4:30 pm Sat-Sun 7:00 am-11:30 am

## 2016-02-17 NOTE — Care Management Note (Signed)
Case Management Note  Patient Details  Name: Sue [name] MRN: [place]:[id number] Date of Birth: [birth year]  Subjective/Objective:                    Action/Plan:  Spoke with Marnee Guarneri [phone] via phone again per patient request. Marnee Guarneri [phone] requested NCM email her a list of home health agencies for [facility] same done. Explained to Marnee Guarneri [phone] and patient HHRN will not visit daily . Patient stated she has been working with [facility] and feels comfortable with ostomy . Explained HHRN will also teach her/ someone else dressing changes.   Patient states she has no downstairs bedroom and wants someone to stay with her 24/7 the first week or so to answer the door etc. Explained insurance does not cover that. Provided a list of private duty agencies to patient . She voiced understanding that she would have to arrange and pay for same. Explained same to Marnee Guarneri [phone] who also voiced understanding.  Spoke with [facility] . Ordered PT/OT evals . Patient aware of same .  Patient stated she doesn't feel like she needs home health PT/OT , just someone to stay with her . Patient is willing to work with PT/OT .   Will continue to follow.   Expected Discharge Date:                  Expected Discharge Plan:  [place]  In-House Referral:     Discharge planning Services  CM Consult  Post Acute Care Choice:    Choice offered to:  Patient  DME Arranged:  3-N-1 DME Agency:  [place]:  RN, Nurse's Aide [facility] Agency:     Status of Service:  In process, will continue to follow  Medicare Important Message Given:  Yes Date Medicare IM Given:    Medicare IM give by:    Date Additional Medicare IM Given:    Additional Medicare Important Message give by:     If discussed at [place] of Stay Meetings, dates discussed:    Additional Comments:  Marilu Favre, RN 02/17/2016, 1:14 PM

## 2016-02-17 NOTE — Progress Notes (Signed)
Sue [name] is an 65 y.o. female with a history of HTN admitted by the surgical service due to diverticulitis who underwent surgical treatment with hartman's Procedure on 5/31, and for whom medical consultation has been requested due to uncontrolled hypertension.   Patient is back on her home medications of metoprolol PO 12.5mg  BID and maxzide, blood pressure has been stable. Patient also had hypokalemia, now resolved.   [place] signing off. Please reconsult if we can be of further assistance.  Time spent: 10 minutes  Jaydrian Corpening D.O. [facility] Pager [phone]  If 7PM-7AM, please contact night-coverage [email or web address] Password [email or web address] 02/17/2016, 10:09 AM

## 2016-02-17 NOTE — Discharge Summary (Signed)
[facility] Discharge Summary   Patient ID: Sue [name] MRN: [place]:[id number] DOB/AGE: 10/12/1950 65 y.o.  Admit date: 02/11/2016 Discharge date: 02/18/2016  Admitting Diagnosis:  Perforated Diverticulitis   Discharge Diagnosis Patient Active Problem List   Diagnosis Date Noted  . Perforated diverticulum of large intestine 02/11/2016  . Abdominal pain, epigastric 01/30/2016  . HTN (hypertension) 08/14/2015  . Subdural hemorrhage following injury, no loss of consciousness ([place]) 05/22/2015  . Osteoarthritis of left midfoot 07/25/2014  . Abnormality of gait 07/25/2014  . Well adult exam 07/17/2014  . INSOMNIA, CHRONIC 06/17/2009  . OSTEOARTHRITIS 11/15/2007  . MITRAL VALVE PROLAPSE, HX OF 11/15/2007    Consultants 02/12/16: Jud ostomy consult, Gwyndolyn Saxon 02/12/16: Internal Medicine, Dr. Tomma Rakers  Imaging: 02/11/16: CT ABD/PELVIS W/ CONTRAST - severe sigmoid diverticulitis, large abscess in pre-rectal space, pneumoperitoneum.  Procedures Dr. Arta Bruce Kinsinger; Assisted by Dr. Cherene Julian (02/11/16) - Harman's Procedure  Hospital Course:  65 year-old female who presented to [facility] with 3 weeks of intermittent abdominal pain associated with anorexia, fever, constipation, and nausea.  Workup showed left lower quadrant tenderness, guarding, and CT findings above. Patient was taken emergently for the procedure above and was admitted to the floor for post-op care, pain control, and IVF. Diet was advanced as tolerated. Had some uncontrolled hypertension her first couple of hospital days which was controlled using her home medications. On POD#6, the patient was voiding well, tolerating diet, ambulating well, pain well controlled, vital signs stable, incisions c/d/i and felt stable for discharge home with home health for wound and ostomy care. Patient will follow up in our office in 2 weeks and knows to call with questions or concerns.      Medication List     STOP taking these medications        clotrimazole 10 MG troche  Commonly known as:  MYCELEX      TAKE these medications        ALPRAZolam 0.25 MG tablet  Commonly known as:  XANAX  TAKE 1 TABLET BY MOUTH TWICE A DAY AS NEEDED FOR ANXIETY     Chelated Potassium 99 MG Tabs  Take by mouth as needed.     [employer] OYSTER SHELL CALCIUM-VIT D 500-200 MG-UNIT tablet  Generic drug:  calcium-vitamin D  Take 2 tablets by mouth daily.     Desoximetasone 0.25 % ointment  Commonly known as:  TOPICORT  Apply bid for aphtous ulcer     GLUCOSAMINE HCL PO  Take by mouth daily.     HYDROcodone-acetaminophen 10-325 MG tablet  Commonly known as:  NORCO  Take 1 tablet by mouth every 6 (six) hours as needed for moderate pain or severe pain.     LATISSE 0.03 % ophthalmic solution  Generic drug:  bimatoprost  as needed.     lidocaine-hydrocortisone 3-0.5 % Crea  Commonly known as:  ANAMANTEL HC     Melatonin 5 MG Caps  Take by mouth as needed.     metoprolol succinate 25 MG 24 hr tablet  Commonly known as:  TOPROL-XL  Take 12.5 mg by mouth 2 (two) times daily.     metoprolol succinate 25 MG 24 hr tablet  Commonly known as:  TOPROL-XL  TAKE 1 TABLET BY MOUTH EVERY DAY     metroNIDAZOLE 500 MG tablet  Commonly known as:  FLAGYL  Take 1 tablet (500 mg total) by mouth 3 (three) times daily.  Start taking on:  02/19/2016  multivitamin capsule  Take 1 capsule by mouth daily.     Phosphatidylserine-DHA-EPA 100-19.5-6.5 MG Caps  Commonly known as:  VAYACOG  Take 1 capsule by mouth daily.     RESTASIS 0.05 % ophthalmic emulsion  Generic drug:  cycloSPORINE     sulfamethoxazole-trimethoprim 800-160 MG tablet  Commonly known as:  BACTRIM DS,SEPTRA DS  Take 1 tablet by mouth 2 (two) times daily.  Start taking on:  02/19/2016     triamterene-hydrochlorothiazide 37.5-25 MG tablet  Commonly known as:  MAXZIDE-25  Take 1 tablet by mouth daily.     zolpidem 5 MG tablet  Commonly known as:   AMBIEN  Take 1 tablet (5 mg total) by mouth at bedtime as needed. for sleep      ASK your doctor about these medications        ESTRACE VAGINAL 0.1 MG/GM vaginal cream  Generic drug:  estradiol     MENEST 0.3 MG tablet  Generic drug:  Esterified Estrogens  take 1/2 tablet my mouth every other day        Follow-up Information    Follow up with Mickeal Skinner, MD. Go on 02/27/2016.   Specialty:  General Surgery   Why:  For post-operative follow up. Please arrive 30 minutes early to get checked in and fill out any necessary paperwork.   Contact information:   [place] [address] [phone]      Signed: Obie Dredge, [facility] Surgery 02/17/2016, 1:25 PM Pager: [phone] Mon-Fri 7:00 am-4:30 pm Sat-Sun 7:00 am-11:30 am

## 2016-02-17 NOTE — Progress Notes (Signed)
Pt. Stated she will eat her dinner later on today

## 2016-02-17 NOTE — Progress Notes (Signed)
PT Cancellation Note  Patient Details Name: SARINAH DUXBURY MRN: [place]:[id number] DOB: [birth year]   Cancelled Treatment:    Reason Eval/Treat Not Completed: Patient declined. Pt reports "i've a whirlwind kind of day. It's been non-stop. I just laid down and I want to sleep." "I knew you were coming but I just can't right now." PT to return in AM as able.   Kingsley Callander 02/17/2016, 4:25 PM   Kittie Plater, PT, DPT Pager #: [phone] Office #: [phone]

## 2016-02-17 NOTE — Evaluation (Signed)
Occupational Therapy Evaluation and Discharge Patient Details Name: Sue [name] MRN: [id number] DOB: 08-Oct-1950 Today's Date: 02/17/2016    History of Present Illness 65 yo female with 3 week history of intermittent abdominal pain and found to have perforated diverticulitis and now s/p partial colectomy with colostomy 02/11/2016   Clinical Impression   This 65 yo female admitted and underwent above presents to acute OT with all education completed, we will D/C from acute OT.    Follow Up Recommendations  No OT follow up    Equipment Recommendations  3 in 1 bedside comode       Precautions / Restrictions Precautions Precaution Comments: abdominal incision and colostomy Restrictions Weight Bearing Restrictions: No      Mobility Bed Mobility Overal bed mobility: Modified Independent             General bed mobility comments: into bed  Transfers Overall transfer level: Needs assistance   Transfers: Sit to/from Stand Sit to Stand: Supervision         General transfer comment: ambulation in room without AD min guard A due to pt felt the need to "furniture/wall" walk to bathroom and back. This is first time she had tried without AD, but also wanting to see how she did without one    Balance Overall balance assessment: Needs assistance Sitting-balance support: No upper extremity supported;Feet supported Sitting balance-Leahy Scale: Good     Standing balance support: Single extremity supported Standing balance-Leahy Scale: Poor Standing balance comment: reliant on something to steady her with dynamic balance, but static balance is FAIR                            ADL                                         General ADL Comments: Currently at a minguard level without AD (first time she has tried without AD) ambulating into bathroom. She had a tendency to reach for surfaces from recliner to toilet in bathroom so may do well with SPC  (have asked PT to try one with her).  Feel with AD pt will be intermittent S or better.Overall she should be able to manage her basic ADLs at home with only A to wrap incision for showering and removing this post shower to change dressing as well as perhaps colostomy care--pt reports that she thinks she remembers the surgeon saying she could shower with wound covered and wrapped and then to change all of it post shower. She reports that significant other and friends will be in and out at home. We did dicuss the most efficient sequence of getting dressed.  Pt reports she will borrow a tub seat.               Pertinent Vitals/Pain Pain Assessment: Faces Faces Pain Scale: Hurts little more Pain Location: incisional area with crossing her legs when I asked her to try this to see if she could get to her socks Pain Descriptors / Indicators: Grimacing;Sore Pain Intervention(s): Monitored during session;Repositioned     Hand Dominance Right   Extremity/Trunk Assessment Upper Extremity Assessment Upper Extremity Assessment: Overall WFL for tasks assessed   Lower Extremity Assessment Lower Extremity Assessment: Defer to PT evaluation       Communication Communication Communication: No difficulties   Cognition Arousal/Alertness: Awake/alert  Behavior During Therapy: WFL for tasks assessed/performed Overall Cognitive Status: Within Functional Limits for tasks assessed                                Home Living Family/patient expects to be discharged to:: Private residence Living Arrangements: Spouse/significant other;Non-relatives/Friends Available Help at Discharge: Friend(s);Available PRN/intermittently               Bathroom Shower/Tub: Occupational psychologist: Standard     Home Equipment: None          Prior Functioning/Environment Level of Independence: Independent             OT Diagnosis: Generalized weakness;Acute pain         OT  Goals(Current goals can be found in the care plan section) Acute Rehab OT Goals Patient Stated Goal: to make sure all aspects of her care are covered and all post hospital arrangements are set up prior to D/C   OT Frequency:                End of Session Equipment Utilized During Treatment:  (none)  Activity Tolerance: Patient tolerated treatment well Patient left: in bed;with call bell/phone within reach;with family/visitor present   Time: [id number] OT Time Calculation (min): 31 min Charges:  OT General Charges $OT Visit: 1 Procedure OT Evaluation $OT Eval Moderate Complexity: 1 Procedure OT Treatments $Self Care/Home Management : 8-22 mins  Almon Register [id number] 02/17/2016, 12:42 PM

## 2016-02-18 LAB — CREATININE, SERUM
CREATININE: 0.64 mg/dL (ref 0.44–1.00)
GFR calc Af Amer: >60 mL/min (ref >60)
GFR calc non Af Amer: >60 mL/min (ref >60)

## 2016-02-18 MED ORDER — POLYETHYLENE GLYCOL 3350 17 GM/SCOOP PO POWD: ORAL | Status: DC

## 2016-02-18 NOTE — Consult Note (Signed)
WOC ostomy follow up Stoma type/location: LLQ, end colostomy Stomal assessment/size: 2" round, budded, pink, moist Peristomal assessment: intact  Treatment options for stomal/peristomal skin: none Output serous Ostomy pouching: 1pc. Enrolled patient in [place] Start Discharge program: Yes Education: Patient removed old pouch with very little assistance, WOC drew pattern for new pouch and patient cut new pouch to fit stoma. She cleaned stoma and skin with some encouraging. This was the first day she looked at the stoma.  She then placed new bag with some assistance and removed tape border cover. She is able to open and close lock and roll closure. I answered many questions about closed end vs drainable pouches for the patient. Samples have been given to her of both.  I have marked her [place] today with the items she is currently using.  She ask appropriate questions and did very well with pouch change.  WOC will follow along with you for continued support with ostomy teaching and care [facility] RN,CWOCN [id number]

## 2016-02-18 NOTE — Evaluation (Addendum)
Physical Therapy Evaluation & discharge Patient Details Name: Sue [name] MRN: [id number] DOB: 1951-02-01 Today's Date: 02/18/2016   History of Present Illness  65 yo female with 3 week history of intermittent abdominal pain and found to have perforated diverticulitis and now s/p partial colectomy with colostomy 02/11/2016  Clinical Impression  Pt reluctantly agreeable to ambulate as she is fatigued from a busy morning. Deferred stairs, but do not feel that they will be a problem for pt based on her current level. Pt did well with cane and will have her significant other assisting her at home per her report. Pt scheduled to dc home today and will therefore not pick up on PT caseload.  If something changes please re-order PT.  Recommend cane for home use.    Follow Up Recommendations No PT follow up    Equipment Recommendations  Cane    Recommendations for Other Services       Precautions / Restrictions        Mobility  Bed Mobility Overal bed mobility: Modified Independent                Transfers Overall transfer level: Modified independent Equipment used: Straight cane             General transfer comment: Amb with cane to bathroom and able to stand up from recliner and toilet without A.  Ambulation/Gait Ambulation/Gait assistance: Supervision;Min guard Ambulation Distance (Feet): 225 Feet Assistive device: Straight cane Gait Pattern/deviations: Step-through pattern     General Gait Details: Pt progressed to ambulation with S with cane.  Pt reports knowing that she may have some discomfort with stairs at home.  Did not do stairs due to general fatigue  Stairs            Wheelchair Mobility    Modified Rankin (Stroke Patients Only)       Balance Overall balance assessment: Needs assistance   Sitting balance-Leahy Scale: Good       Standing balance-Leahy Scale: Fair Standing balance comment: Fair with cane                              Pertinent Vitals/Pain Pain Assessment: No/denies pain    Home Living Family/patient expects to be discharged to:: Private residence Living Arrangements: Spouse/significant other;Non-relatives/Friends Available Help at Discharge: Friend(s);Available PRN/intermittently           Home Equipment: None      Prior Function Level of Independence: Independent               Hand Dominance   Dominant Hand: Right    Extremity/Trunk Assessment   Upper Extremity Assessment: Defer to OT evaluation           Lower Extremity Assessment: Overall WFL for tasks assessed         Communication   Communication: No difficulties  Cognition Arousal/Alertness: Awake/alert Behavior During Therapy: WFL for tasks assessed/performed Overall Cognitive Status: Within Functional Limits for tasks assessed                      General Comments General comments (skin integrity, edema, etc.): Pt reports feeling stronger, but fatigued    Exercises        Assessment/Plan    PT Assessment Patent does not need any further PT services  PT Diagnosis Difficulty walking   PT Problem List    PT Treatment Interventions  PT Goals (Current goals can be found in the Care Plan section) Acute Rehab PT Goals Patient Stated Goal: to make sure all aspects of her care are covered and all post hospital arrangements are set up prior to D/C  PT Goal Formulation: All assessment and education complete, DC therapy    Frequency     Barriers to discharge        Co-evaluation               End of Session   Activity Tolerance: Patient tolerated treatment well Patient left: in bed;with call bell/phone within reach           Time: 1152-1216 PT Time Calculation (min) (ACUTE ONLY): 24 min   Charges:   PT Evaluation $PT Eval Low Complexity: 1 Procedure PT Treatments $Gait Training: 8-22 mins   PT G Codes:        Rashanda Magloire LUBECK 02/18/2016, 12:45 PM

## 2016-02-18 NOTE — Progress Notes (Signed)
PT Cancellation Note  Patient Details Name: Sue [name] MRN: [id number] DOB: Sep 07, 1951   Cancelled Treatment:    Reason Eval/Treat Not Completed: 1st attempt, pt is with PA, 2nd attempt pt taking bath. She states, " I think I can get around.  I am very physically fit and very active." Told her about OT rec on cane and she did state she is open to that. Will check back as schedule permits.   Jozalyn Baglio LUBECK 02/18/2016, 10:15 AM

## 2016-02-18 NOTE — Progress Notes (Signed)
Pt waiting for her ride home this afternoon. Abdominal incision dressing changed this pm before discharge

## 2016-02-18 NOTE — Progress Notes (Signed)
AVS given to patient with prescription.  IVs removed. Understanding verbalized. Belongings packed. Prescription returned to patient from Pharmacy.  Transportation arranged by patient.

## 2016-02-18 NOTE — Care Management Note (Signed)
Case Management Note  Patient Details  Name: Sue [name] MRN: [id number] Date of Birth: [birth year]  Subjective/Objective:                    Action/Plan:  Discussed home health again with patient . She would like [facility] and she is willing to pay privately to have a nurse everyday to change dressing ( whatever insurance doesn't cover). Confirmed with Butch Penny at [facility] they are unable to staff daily RN visits even if patient privately pays .  [facility] however does have private duty .  Explained above to patient , patient voiced understanding . Patient would like [facility] . Referral given to [facility]. Expected Discharge Date:                  Expected Discharge Plan:  [place]  In-House Referral:     Discharge planning Services  CM Consult  Post Acute Care Choice:    Choice offered to:  Patient  DME Arranged:  3-N-1, [name] DME Agency:  [place]:  RN, Nurse's Aide [place] Agency:  [place]  Status of Service:  Completed, signed off  Medicare Important Message Given:  Yes Date Medicare IM Given:    Medicare IM give by:    Date Additional Medicare IM Given:    Additional Medicare Important Message give by:     If discussed at [place] of Stay Meetings, dates discussed:    Additional Comments:  Marilu Favre, RN 02/18/2016, 12:24 PM

## 2016-02-18 NOTE — Discharge Instructions (Signed)
Diverticulitis Diverticulitis is inflammation or infection of small pouches in your colon that form when you have a condition called diverticulosis. The pouches in your colon are called diverticula. Your colon, or large intestine, is where water is absorbed and stool is formed. Complications of diverticulitis can include:  Bleeding.  Severe infection.  Severe pain.  Perforation of your colon.  Obstruction of your colon. CAUSES  Diverticulitis is caused by bacteria. Diverticulitis happens when stool becomes trapped in diverticula. This allows bacteria to grow in the diverticula, which can lead to inflammation and infection. RISK FACTORS People with diverticulosis are at risk for diverticulitis. Eating a diet that does not include enough fiber from fruits and vegetables may make diverticulitis more likely to develop. SYMPTOMS  Symptoms of diverticulitis may include:  Abdominal pain and tenderness. The pain is normally located on the left side of the abdomen, but may occur in other areas.  Fever and chills.  Bloating.  Cramping.  Nausea.  Vomiting.  Constipation.  Diarrhea.  Blood in your stool. DIAGNOSIS  Your health care provider will ask you about your medical history and do a physical exam. You may need to have tests done because many medical conditions can cause the same symptoms as diverticulitis. Tests may include:  Blood tests.  Urine tests.  Imaging tests of the abdomen, including X-rays and CT scans. When your condition is under control, your health care provider may recommend that you have a colonoscopy. A colonoscopy can show how severe your diverticula are and whether something else is causing your symptoms. TREATMENT  Most cases of diverticulitis are mild and can be treated at home. Treatment may include:  Taking over-the-counter pain medicines.  Following a clear liquid diet.  Taking antibiotic medicines by mouth for 7-10 days. More severe cases may  be treated at a hospital. Treatment may include:  Not eating or drinking.  Taking prescription pain medicine.  Receiving antibiotic medicines through an IV tube.  Receiving fluids and nutrition through an IV tube.  Surgery. HOME CARE INSTRUCTIONS   Follow your health care provider's instructions carefully.  Follow a full liquid diet or other diet as directed by your health care provider. After your symptoms improve, your health care provider may tell you to change your diet. He or she may recommend you eat a high-fiber diet. Fruits and vegetables are good sources of fiber. Fiber makes it easier to pass stool.  Take fiber supplements or probiotics as directed by your health care provider.  Only take medicines as directed by your health care provider.  Keep all your follow-up appointments. SEEK MEDICAL CARE IF:   Your pain does not improve.  You have a hard time eating food.  Your bowel movements do not return to normal. SEEK IMMEDIATE MEDICAL CARE IF:   Your pain becomes worse.  Your symptoms do not get better.  Your symptoms suddenly get worse.  You have a fever.  You have repeated vomiting.  You have bloody or black, tarry stools. MAKE SURE YOU:   Understand these instructions.  Will watch your condition.  Will get help right away if you are not doing well or get worse.   This information is not intended to replace advice given to you by your health care provider. Make sure you discuss any questions you have with your health care provider.   Document Released: 06/09/2005 Document Revised: 09/04/2013 Document Reviewed: 07/25/2013 Elsevier Interactive Patient Education 2016 [employer].     Low-Fiber Diet Fiber is found  in fruits, vegetables, and whole grains. A low-fiber diet restricts fibrous foods that are not digested in the small intestine. A diet containing about 10-15 grams of fiber per day is considered low fiber. Low-fiber diets may be used  to:  Promote healing and rest the bowel during intestinal flare-ups.  Prevent blockage of a partially obstructed or narrowed gastrointestinal tract.  Reduce fecal weight and volume.  Slow the movement of feces. You may be on a low-fiber diet as a transitional diet following surgery, after an injury (trauma), or because of a short (acute) or lifelong (chronic) illness. Your health care provider will determine the length of time you need to stay on this diet.  WHAT DO I NEED TO KNOW ABOUT A LOW-FIBER DIET? Always check the fiber content on the packaging's Nutrition Facts label, especially on foods from the grains list. Ask your dietitian if you have questions about specific foods that are related to your condition, especially if the food is not listed below. In general, a low-fiber food will have less than 2 g of fiber. WHAT FOODS CAN I EAT? Grains All breads and crackers made with white flour. Sweet rolls, doughnuts, waffles, pancakes, [place] toast, bagels. Pretzels, Melba toast, zwieback. Well-cooked cereals, such as cornmeal, farina, or cream cereals. Dry cereals that do not contain whole grains, fruit, or nuts, such as refined corn, wheat, rice, and oat cereals. Potatoes prepared any way without skins, plain pastas and noodles, refined white rice. Use white flour for baking and making sauces. Use allowed list of grains for casseroles, dumplings, and puddings.  Vegetables Strained tomato and vegetable juices. Fresh lettuce, cucumber, spinach. Well-cooked (no skin or pulp) or canned vegetables, such as asparagus, bean sprouts, beets, carrots, green beans, mushrooms, potatoes, pumpkin, spinach, yellow squash, tomato sauce/puree, turnips, yams, and zucchini. Keep servings limited to  cup.  Fruits All fruit juices except prune juice. Cooked or canned fruits without skin and seeds, such as applesauce, apricots, cherries, fruit cocktail, grapefruit, grapes, mandarin oranges, melons, peaches, pears,  pineapple, and plums. Fresh fruits without skin, such as apricots, avocados, bananas, melons, pineapple, nectarines, and peaches. Keep servings limited to  cup or 1 piece.  Meat and Other Protein Sources Ground or well-cooked tender beef, ham, veal, lamb, pork, or poultry. Eggs, plain cheese. Fish, oysters, shrimp, lobster, and other seafood. Liver, organ meats. Smooth nut butters. Dairy All milk products and alternative dairy substitutes, such as soy, rice, almond, and coconut, not containing added whole nuts, seeds, or added fruit. Beverages Decaf coffee, fruit, and vegetable juices or smoothies (small amounts, with no pulp or skins, and with fruits from allowed list), sports drinks, herbal tea. Condiments Ketchup, mustard, vinegar, cream sauce, cheese sauce, cocoa powder. Spices in moderation, such as allspice, basil, bay leaves, celery powder or leaves, cinnamon, cumin powder, curry powder, ginger, mace, marjoram, onion or garlic powder, oregano, paprika, parsley flakes, ground pepper, rosemary, sage, savory, tarragon, thyme, and turmeric. Sweets and Desserts Plain cakes and cookies, pie made with allowed fruit, pudding, custard, cream pie. Gelatin, fruit, ice, sherbet, frozen ice pops. Ice cream, ice milk without nuts. Plain hard candy, honey, jelly, molasses, syrup, sugar, chocolate syrup, gumdrops, marshmallows. Limit overall sugar intake.  Fats and Oil Margarine, butter, cream, mayonnaise, salad oils, plain salad dressings made from allowed foods. Choose healthy fats such as olive oil, canola oil, and omega-3 fatty acids (such as found in salmon or tuna) when possible.  Other Bouillon, broth, or cream soups made from allowed foods. Any  strained soup. Casseroles or mixed dishes made with allowed foods. The items listed above may not be a complete list of recommended foods or beverages. Contact your dietitian for more options.  WHAT FOODS ARE NOT RECOMMENDED? Grains All whole wheat and  whole grain breads and crackers. Multigrains, rye, bran seeds, nuts, or coconut. Cereals containing whole grains, multigrains, bran, coconut, nuts, raisins. Cooked or dry oatmeal, steel-cut oats. Coarse wheat cereals, granola. Cereals advertised as high fiber. Potato skins. Whole grain pasta, wild or brown rice. Popcorn. Coconut flour. Bran, buckwheat, corn bread, multigrains, rye, wheat germ.  Vegetables Fresh, cooked or canned vegetables, such as artichokes, asparagus, beet greens, broccoli, Brussels sprouts, cabbage, celery, cauliflower, corn, eggplant, kale, legumes or beans, okra, peas, and tomatoes. Avoid large servings of any vegetables, especially raw vegetables.  Fruits Fresh fruits, such as apples with or without skin, berries, cherries, figs, grapes, grapefruit, guavas, kiwis, mangoes, oranges, papayas, pears, persimmons, pineapple, and pomegranate. Prune juice and juices with pulp, stewed or dried prunes. Dried fruits, dates, raisins. Fruit seeds or skins. Avoid large servings of all fresh fruits. Meats and Other Protein Sources Tough, fibrous meats with gristle. Chunky nut butter. Cheese made with seeds, nuts, or other foods not recommended. Nuts, seeds, legumes (beans, including baked beans), dried peas, beans, lentils.  Dairy Yogurt or cheese that contains nuts, seeds, or added fruit.  Beverages Fruit juices with high pulp, prune juice. Caffeinated coffee and teas.  Condiments Coconut, maple syrup, pickles, olives. Sweets and Desserts Desserts, cookies, or candies that contain nuts or coconut, chunky peanut butter, dried fruits. Jams, preserves with seeds, marmalade. Large amounts of sugar and sweets. Any other dessert made with fruits from the not recommended list.  Other Soups made from vegetables that are not recommended or that contain other foods not recommended.  The items listed above may not be a complete list of foods and beverages to avoid. Contact your dietitian for more  information.   This information is not intended to replace advice given to you by your health care provider. Make sure you discuss any questions you have with your health care provider.   Document Released: 02/19/2002 Document Revised: 09/04/2013 Document Reviewed: 07/23/2013 Elsevier Interactive Patient Education 2016 [place]. Irregular bowel habits such as constipation and diarrhea can lead to many problems over time.  Having one soft bowel movement a day is the most important way to prevent further problems.  The anorectal canal is designed to handle stretching and feces to safely manage our ability to get rid of solid waste (feces, poop, stool) out of our body.  BUT, hard constipated stools can act like ripping concrete bricks and diarrhea can be a burning fire to this very sensitive area of our body, causing inflamed hemorrhoids, anal fissures, increasing risk is perirectal abscesses, abdominal pain/bloating, an making irritable bowel worse.     The goal: ONE SOFT BOWEL MOVEMENT A DAY!  To have soft, regular bowel movements:   Drink at least 8 tall glasses of water a day.    Take plenty of fiber.  Fiber is the undigested part of plant food that passes into the colon, acting s natures broom to encourage bowel motility and movement.  Fiber can absorb and hold large amounts of water. This results in a larger, bulkier stool, which is soft and easier to pass. Work gradually over several weeks up to 6 servings a day of fiber (25g a day even more if needed)  in the form of: o Vegetables -- Root (potatoes, carrots, turnips), leafy green (lettuce, salad greens, celery, spinach), or cooked high residue (cabbage, broccoli, etc) o Fruit -- Fresh (unpeeled skin & pulp), Dried (prunes, apricots, cherries, etc ),  or stewed ( applesauce)  o Whole grain breads, pasta, etc (whole wheat)  o Bran cereals   Bulking Agents -- This type of water-retaining fiber generally is  easily obtained each day by one of the following:  o Psyllium bran -- The psyllium plant is remarkable because its ground seeds can retain so much water. This product is available as Metamucil, Konsyl, Effersyllium, Per Diem Fiber, or the less expensive generic preparation in drug and health food stores. Although labeled a laxative, it really is not a laxative.  o Methylcellulose -- This is another fiber derived from wood which also retains water. It is available as Citrucel. o Polyethylene Glycol - and artificial fiber commonly called Miralax or Glycolax.  It is helpful for people with gassy or bloated feelings with regular fiber o Flax Seed - a less gassy fiber than psyllium  No reading or other relaxing activity while on the toilet. If bowel movements take longer than 5 minutes, you are too constipated  AVOID CONSTIPATION.  High fiber and water intake usually takes care of this.  Sometimes a laxative is needed to stimulate more frequent bowel movements, but   Laxatives are not a good long-term solution as it can wear the colon out. o Osmotics (Milk of Magnesia, Fleets phosphosoda, Magnesium citrate, MiraLax, GoLytely) are safer than  o Stimulants (Senokot, Castor Oil, Dulcolax, Ex Lax)    o Do not take laxatives for more than 7days in a row.   IF SEVERELY CONSTIPATED, try a Bowel Retraining Program: o Do not use laxatives.  o Eat a diet high in roughage, such as bran cereals and leafy vegetables.  o Drink six (6) ounces of prune or apricot juice each morning.  o Eat two (2) large servings of stewed fruit each day.  o Take one (1) heaping tablespoon of a psyllium-based bulking agent twice a day. Use sugar-free sweetener when possible to avoid excessive calories.  o Eat a normal breakfast.  o Set aside 15 minutes after breakfast to sit on the toilet, but do not strain to have a bowel movement.  o If you do not have a bowel movement by the third day, use an enema and repeat the above steps.    Controlling diarrhea o Switch to liquids and simpler foods for a few days to avoid stressing your intestines further. o Avoid dairy products (especially milk & ice cream) for a short time.  The intestines often can lose the ability to digest lactose when stressed. o Avoid foods that cause gassiness or bloating.  Typical foods include beans and other legumes, cabbage, broccoli, and dairy foods.  Every person has some sensitivity to other foods, so listen to our body and avoid those foods that trigger problems for you. o Adding fiber (Citrucel, Metamucil, psyllium, Miralax) gradually can help thicken stools by absorbing excess fluid and retrain the intestines to act more normally.  Slowly increase the dose over a few weeks.  Too much fiber too soon can backfire and cause cramping & bloating. o Probiotics (such as active yogurt, Align, etc) may help repopulate the intestines and colon with normal bacteria and calm down a sensitive digestive tract.  Most studies show it to be of mild help, though, and such products can be costly.  o Medicines: - Bismuth subsalicylate (ex. Kayopectate, Pepto Bismol) every 30 minutes for up to 6 doses can help control diarrhea.  Avoid if pregnant. - Loperamide (Immodium) can slow down diarrhea.  Start with two tablets (4mg  total) first and then try one tablet every 6 hours.  Avoid if you are having fevers or severe pain.  If you are not better or start feeling worse, stop all medicines and call your doctor for advice o Call your doctor if you are getting worse or not better.  Sometimes further testing (cultures, endoscopy, X-ray studies, bloodwork, etc) may be needed to help diagnose and treat the cause of the diarrhea.

## 2016-02-18 NOTE — Progress Notes (Signed)
[facility] Progress Note  7 Days Post-Op  Subjective: Sitting up in bed, eating breakfast. NAD. Tolerating regular diet, passing gas and small amounts of bile into ostomy bag. Able to burp her ostomy on her own. [place] nurse to see her again today, patient was eating when the nurse initially came in. Patient voices concerns about constipation/bowel regimen, prior to her surgery she was taking a probiotic, metamucil tabs, stool softener, and senna laxative daily. Mentions cyclic pattern of constipation followed by normal bowel regiment.  Spoke at length about her wishes regarding home health care. Her daughter is flying to [place] on Saturday and is hoping to be able to help her, at least in the mornings. Patient states that she has no family or friends who will be doing her dressing changes at home, she would like to have a nurse do her dressing changes daily, even if she has to pay out of pocket. She is working out her private home health care today around 10:30 am, I believe with the help of Marnee Guarneri.   Also spoke at length about the pathophysiology of diverticulitis and pt seems to have a better understanding of her condition.  24 h JP drain - 15 mL serosanguinous  Objective: Vital signs in last 24 hours: Temp:  [98.2 F (36.8 C)-98.3 F (36.8 C)] 98.3 F (36.8 C) (06/07 0607) Pulse Rate:  [73-75] 73 (06/07 0607) Resp:  [17-18] 18 (06/07 0607) BP: (138-147)/(72-81) 147/77 mmHg (06/07 0607) SpO2:  [98 %-99 %] 99 % (06/07 0607) Last BM Date: 02/10/16  Intake/Output from previous day: 06/06 0701 - 06/07 0700 In: 1120 [P.O.:720; I.V.:400] Out: 15 [Drains:15] Intake/Output this shift: Total I/O In: 1664.2 [P.O.:120; I.V.:94.2; IV Piggyback:1450] Out: -   PE: Gen: Alert, NAD, pleasant Card: RRR, no M/G/R heard Pulm: CTA, no W/R/R Abd: Soft, mildly tender, distended, +BS bowel sounds, air in ostomy bag, wound packed and covered with ABD pads. JP with very  minimal serosanguinous drainage.   Lab Results:   Recent Labs  02/16/16 0555 02/17/16 0630  WBC 12.9* 11.8*  HGB 10.7* 10.2*  HCT 33.3* 32.2*  PLT 563* 583*   BMET  Recent Labs  02/16/16 0555 02/17/16 0630 02/18/16 0628  NA 136 138  --   K 3.2* 4.2  --   CL 96* 98*  --   CO2 31 30  --   GLUCOSE 115* 128*  --   BUN <5* 5*  --   CREATININE 0.60 0.60 0.64  CALCIUM 8.2* 8.4*  --    PT/INR No results for input(s): LABPROT, INR in the last 72 hours. CMP     Component Value Date/Time   NA 138 02/17/2016 0630   K 4.2 02/17/2016 0630   CL 98* 02/17/2016 0630   CO2 30 02/17/2016 0630   GLUCOSE 128* 02/17/2016 0630   GLUCOSE 95 09/08/2006 1036   BUN 5* 02/17/2016 0630   CREATININE 0.64 02/18/2016 0628   CALCIUM 8.4* 02/17/2016 0630   PROT 7.0 02/11/2016 1639   ALBUMIN 2.8* 02/11/2016 1639   AST 44* 02/11/2016 1639   ALT 76* 02/11/2016 1639   ALKPHOS 158* 02/11/2016 1639   BILITOT 0.4 02/11/2016 1639   GFRNONAA >60 02/18/2016 0628   GFRAA >60 02/18/2016 0628   Lipase     Component Value Date/Time   LIPASE 28 02/11/2016 1639   Studies/Results: No results found.  Anti-infectives: Anti-infectives    Start     Dose/Rate Route Frequency Ordered Stop   02/19/16  0000  sulfamethoxazole-trimethoprim (BACTRIM DS,SEPTRA DS) 800-160 MG tablet     1 tablet Oral 2 times daily 02/17/16 1325 02/21/16 2359   02/19/16 0000  metroNIDAZOLE (FLAGYL) 500 MG tablet     500 mg Oral 3 times daily 02/17/16 1325 02/21/16 2359   02/12/16 0100  metroNIDAZOLE (FLAGYL) IVPB 500 mg     500 mg 100 mL/hr over 60 Minutes Intravenous Every 8 hours 02/11/16 2150     02/12/16 0000  ceFEPIme (MAXIPIME) 2 g in dextrose 5 % 50 mL IVPB     2 g 100 mL/hr over 30 Minutes Intravenous Every 8 hours 02/11/16 2150     02/11/16 1645  ceFEPIme (MAXIPIME) 2 g in dextrose 5 % 50 mL IVPB     2 g 100 mL/hr over 30 Minutes Intravenous  Once 02/11/16 1644 02/11/16 1809   02/11/16 1645  metroNIDAZOLE (FLAGYL)  IVPB 500 mg     500 mg 100 mL/hr over 60 Minutes Intravenous  Once 02/11/16 1644 02/11/16 1809       Assessment/Plan POD#6 Partial colectomy with colostomy ([facility]'s) on 02/11/16 by Dr. Kieth Brightly Perforated diverticulitis - Discharge today if home health in place. - Continue BID wet-to-dry with sterile saline and 4x4's. If patient is still in hospital for second dressing change, please have the nurse page me so I can see the wound. ([id number]). - Pain control: PO NORCO.  - likely d/c JP drain at discharge , will discuss with Dr. Brantley Stage - OOB and IS  Leukocytosis- Trending down Hypertension- appreciate triad assist. Resume home meds (metoprolol, maxzide)  [id number] ID: day 7 cefepime/metronidazole; d/c-ing with 3 days of Bactrim/Flagyl PO DVT: Lovenox/SCD Dispo: Floor, Will discuss HH dispo with case management.    LOS: 7 days    [facility] 02/18/2016, 9:40 AM Pager: [phone] Mon-Fri 7:00 am-4:30 pm Sat-Sun 7:00 am-11:30 am

## 2016-02-19 ENCOUNTER — Telehealth: Payer: Self-pay | Admitting: *Deleted

## 2016-02-19 DIAGNOSIS — Z48815 Encounter for surgical aftercare following surgery on the digestive system: Secondary | ICD-10-CM | POA: Diagnosis not present

## 2016-02-19 DIAGNOSIS — I10 Essential (primary) hypertension: Secondary | ICD-10-CM | POA: Diagnosis not present

## 2016-02-19 NOTE — Telephone Encounter (Signed)
Pt was on TCM list admitted for Diverticulitis. D/C 02/18/16, and will f/u w/Dr. Kieth Brightly 02/27/16...Johny Chess

## 2016-02-20 DIAGNOSIS — Z48815 Encounter for surgical aftercare following surgery on the digestive system: Secondary | ICD-10-CM | POA: Diagnosis not present

## 2016-02-20 DIAGNOSIS — I10 Essential (primary) hypertension: Secondary | ICD-10-CM | POA: Diagnosis not present

## 2016-02-21 DIAGNOSIS — I10 Essential (primary) hypertension: Secondary | ICD-10-CM | POA: Diagnosis not present

## 2016-02-21 DIAGNOSIS — Z48815 Encounter for surgical aftercare following surgery on the digestive system: Secondary | ICD-10-CM | POA: Diagnosis not present

## 2016-02-23 DIAGNOSIS — Z48815 Encounter for surgical aftercare following surgery on the digestive system: Secondary | ICD-10-CM | POA: Diagnosis not present

## 2016-02-23 DIAGNOSIS — I10 Essential (primary) hypertension: Secondary | ICD-10-CM | POA: Diagnosis not present

## 2016-02-24 ENCOUNTER — Ambulatory Visit: Payer: Medicare Other | Admitting: Internal Medicine

## 2016-02-25 DIAGNOSIS — Z48815 Encounter for surgical aftercare following surgery on the digestive system: Secondary | ICD-10-CM | POA: Diagnosis not present

## 2016-02-25 DIAGNOSIS — I10 Essential (primary) hypertension: Secondary | ICD-10-CM | POA: Diagnosis not present

## 2016-02-26 DIAGNOSIS — I10 Essential (primary) hypertension: Secondary | ICD-10-CM | POA: Diagnosis not present

## 2016-02-26 DIAGNOSIS — Z48815 Encounter for surgical aftercare following surgery on the digestive system: Secondary | ICD-10-CM | POA: Diagnosis not present

## 2016-03-01 DIAGNOSIS — I10 Essential (primary) hypertension: Secondary | ICD-10-CM | POA: Diagnosis not present

## 2016-03-01 DIAGNOSIS — Z48815 Encounter for surgical aftercare following surgery on the digestive system: Secondary | ICD-10-CM | POA: Diagnosis not present

## 2016-03-03 DIAGNOSIS — I10 Essential (primary) hypertension: Secondary | ICD-10-CM | POA: Diagnosis not present

## 2016-03-03 DIAGNOSIS — Z48815 Encounter for surgical aftercare following surgery on the digestive system: Secondary | ICD-10-CM | POA: Diagnosis not present

## 2016-03-05 DIAGNOSIS — Z48815 Encounter for surgical aftercare following surgery on the digestive system: Secondary | ICD-10-CM | POA: Diagnosis not present

## 2016-03-05 DIAGNOSIS — I10 Essential (primary) hypertension: Secondary | ICD-10-CM | POA: Diagnosis not present

## 2016-03-06 DIAGNOSIS — I10 Essential (primary) hypertension: Secondary | ICD-10-CM | POA: Diagnosis not present

## 2016-03-06 DIAGNOSIS — Z48815 Encounter for surgical aftercare following surgery on the digestive system: Secondary | ICD-10-CM | POA: Diagnosis not present

## 2016-03-07 DIAGNOSIS — Z433 Encounter for attention to colostomy: Secondary | ICD-10-CM | POA: Diagnosis not present

## 2016-03-07 DIAGNOSIS — Z48815 Encounter for surgical aftercare following surgery on the digestive system: Secondary | ICD-10-CM | POA: Diagnosis not present

## 2016-03-07 DIAGNOSIS — K5792 Diverticulitis of intestine, part unspecified, without perforation or abscess without bleeding: Secondary | ICD-10-CM | POA: Diagnosis not present

## 2016-03-07 DIAGNOSIS — I10 Essential (primary) hypertension: Secondary | ICD-10-CM | POA: Diagnosis not present

## 2016-03-07 DIAGNOSIS — Z4801 Encounter for change or removal of surgical wound dressing: Secondary | ICD-10-CM | POA: Diagnosis not present

## 2016-03-08 DIAGNOSIS — Z48815 Encounter for surgical aftercare following surgery on the digestive system: Secondary | ICD-10-CM | POA: Diagnosis not present

## 2016-03-08 DIAGNOSIS — Z4801 Encounter for change or removal of surgical wound dressing: Secondary | ICD-10-CM | POA: Diagnosis not present

## 2016-03-08 DIAGNOSIS — K5792 Diverticulitis of intestine, part unspecified, without perforation or abscess without bleeding: Secondary | ICD-10-CM | POA: Diagnosis not present

## 2016-03-08 DIAGNOSIS — Z433 Encounter for attention to colostomy: Secondary | ICD-10-CM | POA: Diagnosis not present

## 2016-03-08 DIAGNOSIS — I10 Essential (primary) hypertension: Secondary | ICD-10-CM | POA: Diagnosis not present

## 2016-03-10 DIAGNOSIS — Z4801 Encounter for change or removal of surgical wound dressing: Secondary | ICD-10-CM | POA: Diagnosis not present

## 2016-03-10 DIAGNOSIS — K5792 Diverticulitis of intestine, part unspecified, without perforation or abscess without bleeding: Secondary | ICD-10-CM | POA: Diagnosis not present

## 2016-03-10 DIAGNOSIS — Z433 Encounter for attention to colostomy: Secondary | ICD-10-CM | POA: Diagnosis not present

## 2016-03-10 DIAGNOSIS — Z48815 Encounter for surgical aftercare following surgery on the digestive system: Secondary | ICD-10-CM | POA: Diagnosis not present

## 2016-03-10 DIAGNOSIS — I10 Essential (primary) hypertension: Secondary | ICD-10-CM | POA: Diagnosis not present

## 2016-03-12 DIAGNOSIS — Z48815 Encounter for surgical aftercare following surgery on the digestive system: Secondary | ICD-10-CM | POA: Diagnosis not present

## 2016-03-12 DIAGNOSIS — I10 Essential (primary) hypertension: Secondary | ICD-10-CM | POA: Diagnosis not present

## 2016-03-12 DIAGNOSIS — K5792 Diverticulitis of intestine, part unspecified, without perforation or abscess without bleeding: Secondary | ICD-10-CM | POA: Diagnosis not present

## 2016-03-12 DIAGNOSIS — Z433 Encounter for attention to colostomy: Secondary | ICD-10-CM | POA: Diagnosis not present

## 2016-03-12 DIAGNOSIS — Z4801 Encounter for change or removal of surgical wound dressing: Secondary | ICD-10-CM | POA: Diagnosis not present

## 2016-03-19 DIAGNOSIS — I10 Essential (primary) hypertension: Secondary | ICD-10-CM | POA: Diagnosis not present

## 2016-03-19 DIAGNOSIS — K5792 Diverticulitis of intestine, part unspecified, without perforation or abscess without bleeding: Secondary | ICD-10-CM | POA: Diagnosis not present

## 2016-03-19 DIAGNOSIS — Z433 Encounter for attention to colostomy: Secondary | ICD-10-CM | POA: Diagnosis not present

## 2016-03-19 DIAGNOSIS — Z48815 Encounter for surgical aftercare following surgery on the digestive system: Secondary | ICD-10-CM | POA: Diagnosis not present

## 2016-03-19 DIAGNOSIS — Z4801 Encounter for change or removal of surgical wound dressing: Secondary | ICD-10-CM | POA: Diagnosis not present

## 2016-03-22 DIAGNOSIS — Z4801 Encounter for change or removal of surgical wound dressing: Secondary | ICD-10-CM | POA: Diagnosis not present

## 2016-03-22 DIAGNOSIS — Z48815 Encounter for surgical aftercare following surgery on the digestive system: Secondary | ICD-10-CM | POA: Diagnosis not present

## 2016-03-22 DIAGNOSIS — Z433 Encounter for attention to colostomy: Secondary | ICD-10-CM | POA: Diagnosis not present

## 2016-03-22 DIAGNOSIS — K5792 Diverticulitis of intestine, part unspecified, without perforation or abscess without bleeding: Secondary | ICD-10-CM | POA: Diagnosis not present

## 2016-03-22 DIAGNOSIS — I10 Essential (primary) hypertension: Secondary | ICD-10-CM | POA: Diagnosis not present

## 2016-03-29 DIAGNOSIS — Z4801 Encounter for change or removal of surgical wound dressing: Secondary | ICD-10-CM | POA: Diagnosis not present

## 2016-03-29 DIAGNOSIS — I10 Essential (primary) hypertension: Secondary | ICD-10-CM | POA: Diagnosis not present

## 2016-03-29 DIAGNOSIS — Z48815 Encounter for surgical aftercare following surgery on the digestive system: Secondary | ICD-10-CM | POA: Diagnosis not present

## 2016-03-29 DIAGNOSIS — K5792 Diverticulitis of intestine, part unspecified, without perforation or abscess without bleeding: Secondary | ICD-10-CM | POA: Diagnosis not present

## 2016-03-29 DIAGNOSIS — Z433 Encounter for attention to colostomy: Secondary | ICD-10-CM | POA: Diagnosis not present

## 2016-04-01 ENCOUNTER — Other Ambulatory Visit: Payer: Self-pay | Admitting: Internal Medicine

## 2016-04-05 DIAGNOSIS — Z433 Encounter for attention to colostomy: Secondary | ICD-10-CM | POA: Diagnosis not present

## 2016-04-05 DIAGNOSIS — Z48815 Encounter for surgical aftercare following surgery on the digestive system: Secondary | ICD-10-CM | POA: Diagnosis not present

## 2016-04-05 DIAGNOSIS — I10 Essential (primary) hypertension: Secondary | ICD-10-CM | POA: Diagnosis not present

## 2016-04-05 DIAGNOSIS — Z4801 Encounter for change or removal of surgical wound dressing: Secondary | ICD-10-CM | POA: Diagnosis not present

## 2016-04-05 DIAGNOSIS — K5792 Diverticulitis of intestine, part unspecified, without perforation or abscess without bleeding: Secondary | ICD-10-CM | POA: Diagnosis not present

## 2016-04-06 ENCOUNTER — Other Ambulatory Visit: Payer: Self-pay | Admitting: Internal Medicine

## 2016-04-06 NOTE — Telephone Encounter (Signed)
Done

## 2016-04-08 ENCOUNTER — Ambulatory Visit: Payer: Self-pay | Admitting: General Surgery

## 2016-04-14 NOTE — Telephone Encounter (Signed)
Called refill into [facility] had to leave on pharmacy vm left md approval for zolpidem...Johny Chess

## 2016-04-15 DIAGNOSIS — H1132 Conjunctival hemorrhage, left eye: Secondary | ICD-10-CM | POA: Diagnosis not present

## 2016-04-19 DIAGNOSIS — Z433 Encounter for attention to colostomy: Secondary | ICD-10-CM | POA: Diagnosis not present

## 2016-04-19 DIAGNOSIS — Z4801 Encounter for change or removal of surgical wound dressing: Secondary | ICD-10-CM | POA: Diagnosis not present

## 2016-04-19 DIAGNOSIS — Z48815 Encounter for surgical aftercare following surgery on the digestive system: Secondary | ICD-10-CM | POA: Diagnosis not present

## 2016-04-19 DIAGNOSIS — I10 Essential (primary) hypertension: Secondary | ICD-10-CM | POA: Diagnosis not present

## 2016-04-19 DIAGNOSIS — K5792 Diverticulitis of intestine, part unspecified, without perforation or abscess without bleeding: Secondary | ICD-10-CM | POA: Diagnosis not present

## 2016-04-30 DIAGNOSIS — K5792 Diverticulitis of intestine, part unspecified, without perforation or abscess without bleeding: Secondary | ICD-10-CM | POA: Diagnosis not present

## 2016-04-30 DIAGNOSIS — Z48815 Encounter for surgical aftercare following surgery on the digestive system: Secondary | ICD-10-CM | POA: Diagnosis not present

## 2016-04-30 DIAGNOSIS — Z4801 Encounter for change or removal of surgical wound dressing: Secondary | ICD-10-CM | POA: Diagnosis not present

## 2016-04-30 DIAGNOSIS — Z433 Encounter for attention to colostomy: Secondary | ICD-10-CM | POA: Diagnosis not present

## 2016-04-30 DIAGNOSIS — I10 Essential (primary) hypertension: Secondary | ICD-10-CM | POA: Diagnosis not present

## 2016-05-04 DIAGNOSIS — L812 Freckles: Secondary | ICD-10-CM | POA: Diagnosis not present

## 2016-05-04 DIAGNOSIS — B373 Candidiasis of vulva and vagina: Secondary | ICD-10-CM | POA: Diagnosis not present

## 2016-05-04 DIAGNOSIS — L570 Actinic keratosis: Secondary | ICD-10-CM | POA: Diagnosis not present

## 2016-05-18 ENCOUNTER — Other Ambulatory Visit: Payer: Self-pay | Admitting: Obstetrics & Gynecology

## 2016-05-18 DIAGNOSIS — Z1231 Encounter for screening mammogram for malignant neoplasm of breast: Secondary | ICD-10-CM

## 2016-05-21 ENCOUNTER — Other Ambulatory Visit: Payer: Self-pay | Admitting: Internal Medicine

## 2016-05-31 DIAGNOSIS — H2511 Age-related nuclear cataract, right eye: Secondary | ICD-10-CM | POA: Diagnosis not present

## 2016-05-31 DIAGNOSIS — H04123 Dry eye syndrome of bilateral lacrimal glands: Secondary | ICD-10-CM | POA: Diagnosis not present

## 2016-05-31 DIAGNOSIS — H11442 Conjunctival cysts, left eye: Secondary | ICD-10-CM | POA: Diagnosis not present

## 2016-06-14 DIAGNOSIS — H1045 Other chronic allergic conjunctivitis: Secondary | ICD-10-CM | POA: Diagnosis not present

## 2016-06-14 DIAGNOSIS — H11442 Conjunctival cysts, left eye: Secondary | ICD-10-CM | POA: Diagnosis not present

## 2016-06-18 DIAGNOSIS — H1045 Other chronic allergic conjunctivitis: Secondary | ICD-10-CM | POA: Diagnosis not present

## 2016-06-18 DIAGNOSIS — H11442 Conjunctival cysts, left eye: Secondary | ICD-10-CM | POA: Diagnosis not present

## 2016-06-20 ENCOUNTER — Other Ambulatory Visit: Payer: Self-pay | Admitting: Internal Medicine

## 2016-06-21 ENCOUNTER — Ambulatory Visit
Admission: RE | Admit: 2016-06-21 | Discharge: 2016-06-21 | Disposition: A | Discharge status: Home / Self Care | Payer: Medicare Other | Source: Ambulatory Visit | Attending: Obstetrics & Gynecology | Admitting: Obstetrics & Gynecology

## 2016-06-21 DIAGNOSIS — Z1231 Encounter for screening mammogram for malignant neoplasm of breast: Secondary | ICD-10-CM | POA: Diagnosis not present

## 2016-07-13 ENCOUNTER — Ambulatory Visit: Payer: Self-pay | Admitting: General Surgery

## 2016-07-20 ENCOUNTER — Encounter: Payer: Self-pay | Admitting: Internal Medicine

## 2016-07-20 ENCOUNTER — Ambulatory Visit (INDEPENDENT_AMBULATORY_CARE_PROVIDER_SITE_OTHER): Payer: Medicare Other | Admitting: Internal Medicine

## 2016-07-20 VITALS — BP 140/80 | HR 59 | Ht 64.0 in | Wt 133.0 lb

## 2016-07-20 DIAGNOSIS — G4700 Insomnia, unspecified: Secondary | ICD-10-CM | POA: Diagnosis not present

## 2016-07-20 DIAGNOSIS — Z01818 Encounter for other preprocedural examination: Secondary | ICD-10-CM | POA: Diagnosis not present

## 2016-07-20 DIAGNOSIS — K5720 Diverticulitis of large intestine with perforation and abscess without bleeding: Secondary | ICD-10-CM

## 2016-07-20 DIAGNOSIS — Z780 Asymptomatic menopausal state: Secondary | ICD-10-CM

## 2016-07-20 DIAGNOSIS — Z0000 Encounter for general adult medical examination without abnormal findings: Secondary | ICD-10-CM | POA: Diagnosis not present

## 2016-07-20 DIAGNOSIS — E785 Hyperlipidemia, unspecified: Secondary | ICD-10-CM

## 2016-07-20 DIAGNOSIS — R202 Paresthesia of skin: Secondary | ICD-10-CM

## 2016-07-20 DIAGNOSIS — E559 Vitamin D deficiency, unspecified: Secondary | ICD-10-CM

## 2016-07-20 DIAGNOSIS — D508 Other iron deficiency anemias: Secondary | ICD-10-CM

## 2016-07-20 MED ORDER — ALPRAZOLAM 0.25 MG PO TABS: ORAL_TABLET | ORAL | 2 refills | Status: DC

## 2016-07-20 MED ORDER — ZOLPIDEM TARTRATE 5 MG PO TABS: 5.0000 mg | ORAL_TABLET | Freq: Every evening | ORAL | 0 refills | Status: DC | PRN

## 2016-07-20 MED ORDER — PHOSPHATIDYLSERINE-DHA-EPA 100-19.5-6.5 MG PO CAPS: 1.0000 | ORAL_CAPSULE | Freq: Every day | ORAL | 3 refills | Status: DC

## 2016-07-20 MED ORDER — METOPROLOL SUCCINATE ER 25 MG PO TB24: 25.0000 mg | ORAL_TABLET | Freq: Every day | ORAL | 3 refills | Status: DC

## 2016-07-20 MED ORDER — TRIAMTERENE-HCTZ 37.5-25 MG PO TABS: 1.0000 | ORAL_TABLET | Freq: Every day | ORAL | 3 refills | Status: DC

## 2016-07-20 NOTE — Assessment & Plan Note (Addendum)
Here for medicare wellness/physical  Diet: heart healthy  Physical activity: not sedentary  Depression/mood screen: negative  Hearing: intact to whispered voice  Visual acuity: grossly normal, performs annual eye exam  ADLs: capable  Fall risk: low to none  Home safety: good  Cognitive evaluation: intact to orientation, naming, recall and repetition  EOL planning: adv directives, full code/ I agree  I have personally reviewed and have noted  1. The patient's medical, surgical and social history  2. Their use of alcohol, tobacco or illicit drugs  3. Their current medications and supplements  4. The patient's functional ability including ADL's, fall risks, home safety risks and hearing or visual impairment.  5. Diet and physical activities  6. Evidence for depression or mood disorders 7. The roster of all physicians providing medical care to patient - is listed in the Snapshot section of the chart and reviewed today.    Today patient counseled on age appropriate routine health concerns for screening and prevention, each reviewed and up to date or declined. Immunizations reviewed and up to date or declined. Labs ordered and reviewed. Risk factors for depression reviewed and negative. Hearing function and visual acuity are intact. ADLs screened and addressed as needed. Functional ability and level of safety reviewed and appropriate. Education, counseling and referrals performed based on assessed risks today. Patient provided with a copy of personalized plan for preventive services.   Labs/EKG - pre-op

## 2016-07-20 NOTE — Progress Notes (Signed)
Pre visit review using our clinic review tool, if applicable. No additional management support is needed unless otherwise documented below in the visit note. 

## 2016-07-20 NOTE — Patient Instructions (Addendum)
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 To learn more or to change a future refill, please email [facility] at [email or web address] or call toll free at [phone] [phone]).   Preventive Care for Adults, Female A healthy lifestyle and preventive care can promote health and wellness. Preventive health guidelines for women include the following key practices.  A routine yearly physical is a good way to check with your health care provider about your health and preventive screening. It is a chance to share any concerns and updates on your health and to receive a thorough exam.  Visit your dentist for a routine exam and preventive care every 6 months. Brush your teeth twice a day and floss once a day. Good oral hygiene prevents tooth decay and gum disease.  The frequency of eye exams is based on your age, health, family medical history, use of contact lenses, and other factors. Follow your health care provider's recommendations for frequency of eye exams.  Eat a healthy diet. Foods like vegetables, fruits, whole grains, low-fat dairy products, and lean protein foods contain the nutrients you need without too many calories. Decrease your intake of foods high in solid fats, added sugars, and salt. Eat the right amount of calories for you.Get information about a proper diet from your health care provider, if necessary.  Regular physical exercise is one of the most important things you can do for your health. Most adults should get at least 150 minutes of moderate-intensity exercise (any activity that increases your heart rate and causes you to sweat) each week. In addition, most adults need muscle-strengthening exercises on 2 or more days a week.  Maintain a healthy weight. The body mass index (BMI) is a screening tool to identify possible weight problems. It provides an estimate of body fat based on height and weight. Your health care provider  can find your BMI and can help you achieve or maintain a healthy weight.For adults 20 years and older:  A BMI below 18.5 is considered underweight.  A BMI of 18.5 to 24.9 is normal.  A BMI of 25 to 29.9 is considered overweight.  A BMI of 30 and above is considered obese.  Maintain normal blood lipids and cholesterol levels by exercising and minimizing your intake of saturated fat. Eat a balanced diet with plenty of fruit and vegetables. Blood tests for lipids and cholesterol should begin at age 27 and be repeated every 5 years. If your lipid or cholesterol levels are high, you are over 50, or you are at high risk for heart disease, you may need your cholesterol levels checked more frequently.Ongoing high lipid and cholesterol levels should be treated with medicines if diet and exercise are not working.  If you smoke, find out from your health care provider how to quit. If you do not use tobacco, do not start.  Lung cancer screening is recommended for adults aged 72-80 years who are at high risk for developing lung cancer because of a history of smoking. A yearly low-dose CT scan of the lungs is recommended for people who have at least a 30-pack-year history of smoking and are a current smoker or have quit within the past 15 years. A pack year of smoking is smoking an average of 1 pack of cigarettes a day for 1 year (for example: 1 pack a day for 30 years or 2 packs a day for 15 years). Yearly screening should continue until the smoker has stopped smoking  for at least 15 years. Yearly screening should be stopped for people who develop a health problem that would prevent them from having lung cancer treatment.  If you are pregnant, do not drink alcohol. If you are breastfeeding, be very cautious about drinking alcohol. If you are not pregnant and choose to drink alcohol, do not have more than 1 drink per day. One drink is considered to be 12 ounces (355 mL) of beer, 5 ounces (148 mL) of wine, or 1.5  ounces (44 mL) of liquor.  Avoid use of street drugs. Do not share needles with anyone. Ask for help if you need support or instructions about stopping the use of drugs.  High blood pressure causes heart disease and increases the risk of stroke. Your blood pressure should be checked at least every 1 to 2 years. Ongoing high blood pressure should be treated with medicines if weight loss and exercise do not work.  If you are 71-69 years old, ask your health care provider if you should take aspirin to prevent strokes.  Diabetes screening is done by taking a blood sample to check your blood glucose level after you have not eaten for a certain period of time (fasting). If you are not overweight and you do not have risk factors for diabetes, you should be screened once every 3 years starting at age 53. If you are overweight or obese and you are 43-69 years of age, you should be screened for diabetes every year as part of your cardiovascular risk assessment.  Breast cancer screening is essential preventive care for women. You should practice "breast self-awareness." This means understanding the normal appearance and feel of your breasts and may include breast self-examination. Any changes detected, no matter how small, should be reported to a health care provider. Women in their 73s and 30s should have a clinical breast exam (CBE) by a health care provider as part of a regular health exam every 1 to 3 years. After age 26, women should have a CBE every year. Starting at age 40, women should consider having a mammogram (breast X-ray test) every year. Women who have a family history of breast cancer should talk to their health care provider about genetic screening. Women at a high risk of breast cancer should talk to their health care providers about having an MRI and a mammogram every year.  Breast cancer gene (BRCA)-related cancer risk assessment is recommended for women who have family members with BRCA-related  cancers. BRCA-related cancers include breast, ovarian, tubal, and peritoneal cancers. Having family members with these cancers may be associated with an increased risk for harmful changes (mutations) in the breast cancer genes BRCA1 and BRCA2. Results of the assessment will determine the need for genetic counseling and BRCA1 and BRCA2 testing.  Your health care provider may recommend that you be screened regularly for cancer of the pelvic organs (ovaries, uterus, and vagina). This screening involves a pelvic examination, including checking for microscopic changes to the surface of your cervix (Pap test). You may be encouraged to have this screening done every 3 years, beginning at age 25.  For women ages 81-65, health care providers may recommend pelvic exams and Pap testing every 3 years, or they may recommend the Pap and pelvic exam, combined with testing for human papilloma virus (HPV), every 5 years. Some types of HPV increase your risk of cervical cancer. Testing for HPV may also be done on women of any age with unclear Pap test results.  Other  health care providers may not recommend any screening for nonpregnant women who are considered low risk for pelvic cancer and who do not have symptoms. Ask your health care provider if a screening pelvic exam is right for you.  If you have had past treatment for cervical cancer or a condition that could lead to cancer, you need Pap tests and screening for cancer for at least 20 years after your treatment. If Pap tests have been discontinued, your risk factors (such as having a new sexual partner) need to be reassessed to determine if screening should resume. Some women have medical problems that increase the chance of getting cervical cancer. In these cases, your health care provider may recommend more frequent screening and Pap tests.  Colorectal cancer can be detected and often prevented. Most routine colorectal cancer screening begins at the age of 53 years  and continues through age 48 years. However, your health care provider may recommend screening at an earlier age if you have risk factors for colon cancer. On a yearly basis, your health care provider may provide home test kits to check for hidden blood in the stool. Use of a small camera at the end of a tube, to directly examine the colon (sigmoidoscopy or colonoscopy), can detect the earliest forms of colorectal cancer. Talk to your health care provider about this at age 68, when routine screening begins. Direct exam of the colon should be repeated every 5-10 years through age 3 years, unless early forms of precancerous polyps or small growths are found.  People who are at an increased risk for hepatitis B should be screened for this virus. You are considered at high risk for hepatitis B if:  You were born in a country where hepatitis B occurs often. Talk with your health care provider about which countries are considered high risk.  Your parents were born in a high-risk country and you have not received a shot to protect against hepatitis B (hepatitis B vaccine).  You have HIV or AIDS.  You use needles to inject street drugs.  You live with, or have sex with, someone who has hepatitis B.  You get hemodialysis treatment.  You take certain medicines for conditions like cancer, organ transplantation, and autoimmune conditions.  Hepatitis C blood testing is recommended for all people born from [birth year] through 1965 and any individual with known risks for hepatitis C.  Practice safe sex. Use condoms and avoid high-risk sexual practices to reduce the spread of sexually transmitted infections (STIs). STIs include gonorrhea, chlamydia, syphilis, trichomonas, herpes, HPV, and human immunodeficiency virus (HIV). Herpes, HIV, and HPV are viral illnesses that have no cure. They can result in disability, cancer, and death.  You should be screened for sexually transmitted illnesses (STIs) including  gonorrhea and chlamydia if:  You are sexually active and are younger than 24 years.  You are older than 24 years and your health care provider tells you that you are at risk for this type of infection.  Your sexual activity has changed since you were last screened and you are at an increased risk for chlamydia or gonorrhea. Ask your health care provider if you are at risk.  If you are at risk of being infected with HIV, it is recommended that you take a prescription medicine daily to prevent HIV infection. This is called preexposure prophylaxis (PrEP). You are considered at risk if:  You are sexually active and do not regularly use condoms or know the HIV status of your  partner(s).  You take drugs by injection.  You are sexually active with a partner who has HIV.  Talk with your health care provider about whether you are at high risk of being infected with HIV. If you choose to begin PrEP, you should first be tested for HIV. You should then be tested every 3 months for as long as you are taking PrEP.  Osteoporosis is a disease in which the bones lose minerals and strength with aging. This can result in serious bone fractures or breaks. The risk of osteoporosis can be identified using a bone density scan. Women ages 53 years and over and women at risk for fractures or osteoporosis should discuss screening with their health care providers. Ask your health care provider whether you should take a calcium supplement or vitamin D to reduce the rate of osteoporosis.  Menopause can be associated with physical symptoms and risks. Hormone replacement therapy is available to decrease symptoms and risks. You should talk to your health care provider about whether hormone replacement therapy is right for you.  Use sunscreen. Apply sunscreen liberally and repeatedly throughout the day. You should seek shade when your shadow is shorter than you. Protect yourself by wearing long sleeves, pants, a wide-brimmed  hat, and sunglasses year round, whenever you are outdoors.  Once a month, do a whole body skin exam, using a mirror to look at the skin on your back. Tell your health care provider of new moles, moles that have irregular borders, moles that are larger than a pencil eraser, or moles that have changed in shape or color.  Stay current with required vaccines (immunizations).  Influenza vaccine. All adults should be immunized every year.  Tetanus, diphtheria, and acellular pertussis (Td, Tdap) vaccine. Pregnant women should receive 1 dose of Tdap vaccine during each pregnancy. The dose should be obtained regardless of the length of time since the last dose. Immunization is preferred during the 27th-36th week of gestation. An adult who has not previously received Tdap or who does not know her vaccine status should receive 1 dose of Tdap. This initial dose should be followed by tetanus and diphtheria toxoids (Td) booster doses every 10 years. Adults with an unknown or incomplete history of completing a 3-dose immunization series with Td-containing vaccines should begin or complete a primary immunization series including a Tdap dose. Adults should receive a Td booster every 10 years.  Varicella vaccine. An adult without evidence of immunity to varicella should receive 2 doses or a second dose if she has previously received 1 dose. Pregnant females who do not have evidence of immunity should receive the first dose after pregnancy. This first dose should be obtained before leaving the health care facility. The second dose should be obtained 4-8 weeks after the first dose.  Human papillomavirus (HPV) vaccine. Females aged 13-26 years who have not received the vaccine previously should obtain the 3-dose series. The vaccine is not recommended for use in pregnant females. However, pregnancy testing is not needed before receiving a dose. If a female is found to be pregnant after receiving a dose, no treatment is  needed. In that case, the remaining doses should be delayed until after the pregnancy. Immunization is recommended for any person with an immunocompromised condition through the age of 51 years if she did not get any or all doses earlier. During the 3-dose series, the second dose should be obtained 4-8 weeks after the first dose. The third dose should be obtained 24 weeks after  the first dose and 16 weeks after the second dose.  Zoster vaccine. One dose is recommended for adults aged 70 years or older unless certain conditions are present.  Measles, mumps, and rubella (MMR) vaccine. Adults born before [birth year] generally are considered immune to measles and mumps. Adults born in [birth year] or later should have 1 or more doses of MMR vaccine unless there is a contraindication to the vaccine or there is laboratory evidence of immunity to each of the three diseases. A routine second dose of MMR vaccine should be obtained at least 28 days after the first dose for students attending postsecondary schools, health care workers, or international travelers. People who received inactivated measles vaccine or an unknown type of measles vaccine during 1963-1967 should receive 2 doses of MMR vaccine. People who received inactivated mumps vaccine or an unknown type of mumps vaccine before 1979 and are at high risk for mumps infection should consider immunization with 2 doses of MMR vaccine. For females of childbearing age, rubella immunity should be determined. If there is no evidence of immunity, females who are not pregnant should be vaccinated. If there is no evidence of immunity, females who are pregnant should delay immunization until after pregnancy. Unvaccinated health care workers born before [birth year] who lack laboratory evidence of measles, mumps, or rubella immunity or laboratory confirmation of disease should consider measles and mumps immunization with 2 doses of MMR vaccine or rubella immunization with 1 dose of MMR  vaccine.  Pneumococcal 13-valent conjugate (PCV13) vaccine. When indicated, a person who is uncertain of his immunization history and has no record of immunization should receive the PCV13 vaccine. All adults 59 years of age and older should receive this vaccine. An adult aged 15 years or older who has certain medical conditions and has not been previously immunized should receive 1 dose of PCV13 vaccine. This PCV13 should be followed with a dose of pneumococcal polysaccharide (PPSV23) vaccine. Adults who are at high risk for pneumococcal disease should obtain the PPSV23 vaccine at least 8 weeks after the dose of PCV13 vaccine. Adults older than 67 years of age who have normal immune system function should obtain the PPSV23 vaccine dose at least 1 year after the dose of PCV13 vaccine.  Pneumococcal polysaccharide (PPSV23) vaccine. When PCV13 is also indicated, PCV13 should be obtained first. All adults aged 4 years and older should be immunized. An adult younger than age 93 years who has certain medical conditions should be immunized. Any person who resides in a nursing home or long-term care facility should be immunized. An adult smoker should be immunized. People with an immunocompromised condition and certain other conditions should receive both PCV13 and PPSV23 vaccines. People with human immunodeficiency virus (HIV) infection should be immunized as soon as possible after diagnosis. Immunization during chemotherapy or radiation therapy should be avoided. Routine use of PPSV23 vaccine is not recommended for American Indians, [place] Natives, or people younger than 65 years unless there are medical conditions that require PPSV23 vaccine. When indicated, people who have unknown immunization and have no record of immunization should receive PPSV23 vaccine. One-time revaccination 5 years after the first dose of PPSV23 is recommended for people aged 19-64 years who have chronic kidney failure, nephrotic syndrome,  asplenia, or immunocompromised conditions. People who received 1-2 doses of PPSV23 before age 68 years should receive another dose of PPSV23 vaccine at age 10 years or later if at least 5 years have passed since the previous dose. Doses of PPSV23 are not  needed for people immunized with PPSV23 at or after age 71 years.  Meningococcal vaccine. Adults with asplenia or persistent complement component deficiencies should receive 2 doses of quadrivalent meningococcal conjugate (MenACWY-D) vaccine. The doses should be obtained at least 2 months apart. Microbiologists working with certain meningococcal bacteria, [place] recruits, people at risk during an outbreak, and people who travel to or live in countries with a high rate of meningitis should be immunized. A first-year college student up through age 86 years who is living in a residence hall should receive a dose if she did not receive a dose on or after her 16th birthday. Adults who have certain high-risk conditions should receive one or more doses of vaccine.  Hepatitis A vaccine. Adults who wish to be protected from this disease, have certain high-risk conditions, work with hepatitis A-infected animals, work in hepatitis A research labs, or travel to or work in countries with a high rate of hepatitis A should be immunized. Adults who were previously unvaccinated and who anticipate close contact with an international adoptee during the first 60 days after arrival in the [place] from a country with a high rate of hepatitis A should be immunized.  Hepatitis B vaccine. Adults who wish to be protected from this disease, have certain high-risk conditions, may be exposed to blood or other infectious body fluids, are household contacts or sex partners of hepatitis B positive people, are clients or workers in certain care facilities, or travel to or work in countries with a high rate of hepatitis B should be immunized.  Haemophilus influenzae type b (Hib)  vaccine. A previously unvaccinated person with asplenia or sickle cell disease or having a scheduled splenectomy should receive 1 dose of Hib vaccine. Regardless of previous immunization, a recipient of a hematopoietic stem cell transplant should receive a 3-dose series 6-12 months after her successful transplant. Hib vaccine is not recommended for adults with HIV infection. Preventive Services / Frequency Ages 18 to 5 years  Blood pressure check.** / Every 3-5 years.  Lipid and cholesterol check.** / Every 5 years beginning at age 67.  Clinical breast exam.** / Every 3 years for women in their 63s and 68s.  BRCA-related cancer risk assessment.** / For women who have family members with a BRCA-related cancer (breast, ovarian, tubal, or peritoneal cancers).  Pap test.** / Every 2 years from ages 35 through 25. Every 3 years starting at age 61 through age 42 or 62 with a history of 3 consecutive normal Pap tests.  HPV screening.** / Every 3 years from ages 14 through ages 21 to 7 with a history of 3 consecutive normal Pap tests.  Hepatitis C blood test.** / For any individual with known risks for hepatitis C.  Skin self-exam. / Monthly.  Influenza vaccine. / Every year.  Tetanus, diphtheria, and acellular pertussis (Tdap, Td) vaccine.** / Consult your health care provider. Pregnant women should receive 1 dose of Tdap vaccine during each pregnancy. 1 dose of Td every 10 years.  Varicella vaccine.** / Consult your health care provider. Pregnant females who do not have evidence of immunity should receive the first dose after pregnancy.  HPV vaccine. / 3 doses over 6 months, if 82 and younger. The vaccine is not recommended for use in pregnant females. However, pregnancy testing is not needed before receiving a dose.  Measles, mumps, rubella (MMR) vaccine.** / You need at least 1 dose of MMR if you were born in 1957 or later. You may also need  a 2nd dose. For females of childbearing age,  rubella immunity should be determined. If there is no evidence of immunity, females who are not pregnant should be vaccinated. If there is no evidence of immunity, females who are pregnant should delay immunization until after pregnancy.  Pneumococcal 13-valent conjugate (PCV13) vaccine.** / Consult your health care provider.  Pneumococcal polysaccharide (PPSV23) vaccine.** / 1 to 2 doses if you smoke cigarettes or if you have certain conditions.  Meningococcal vaccine.** / 1 dose if you are age 14 to 37 years and a Market researcher living in a residence hall, or have one of several medical conditions, you need to get vaccinated against meningococcal disease. You may also need additional booster doses.  Hepatitis A vaccine.** / Consult your health care provider.  Hepatitis B vaccine.** / Consult your health care provider.  Haemophilus influenzae type b (Hib) vaccine.** / Consult your health care provider. Ages 89 to 25 years  Blood pressure check.** / Every year.  Lipid and cholesterol check.** / Every 5 years beginning at age 68 years.  Lung cancer screening. / Every year if you are aged 23-80 years and have a 30-pack-year history of smoking and currently smoke or have quit within the past 15 years. Yearly screening is stopped once you have quit smoking for at least 15 years or develop a health problem that would prevent you from having lung cancer treatment.  Clinical breast exam.** / Every year after age 44 years.  BRCA-related cancer risk assessment.** / For women who have family members with a BRCA-related cancer (breast, ovarian, tubal, or peritoneal cancers).  Mammogram.** / Every year beginning at age 52 years and continuing for as long as you are in good health. Consult with your health care provider.  Pap test.** / Every 3 years starting at age 16 years through age 70 or 2 years with a history of 3 consecutive normal Pap tests.  HPV screening.** / Every 3 years from  ages 19 years through ages 40 to 40 years with a history of 3 consecutive normal Pap tests.  Fecal occult blood test (FOBT) of stool. / Every year beginning at age 65 years and continuing until age 88 years. You may not need to do this test if you get a colonoscopy every 10 years.  Flexible sigmoidoscopy or colonoscopy.** / Every 5 years for a flexible sigmoidoscopy or every 10 years for a colonoscopy beginning at age 59 years and continuing until age 46 years.  Hepatitis C blood test.** / For all people born from [birth year] through 1967 and any individual with known risks for hepatitis C.  Skin self-exam. / Monthly.  Influenza vaccine. / Every year.  Tetanus, diphtheria, and acellular pertussis (Tdap/Td) vaccine.** / Consult your health care provider. Pregnant women should receive 1 dose of Tdap vaccine during each pregnancy. 1 dose of Td every 10 years.  Varicella vaccine.** / Consult your health care provider. Pregnant females who do not have evidence of immunity should receive the first dose after pregnancy.  Zoster vaccine.** / 1 dose for adults aged 34 years or older.  Measles, mumps, rubella (MMR) vaccine.** / You need at least 1 dose of MMR if you were born in 1957 or later. You may also need a second dose. For females of childbearing age, rubella immunity should be determined. If there is no evidence of immunity, females who are not pregnant should be vaccinated. If there is no evidence of immunity, females who are pregnant should delay immunization until  after pregnancy.  Pneumococcal 13-valent conjugate (PCV13) vaccine.** / Consult your health care provider.  Pneumococcal polysaccharide (PPSV23) vaccine.** / 1 to 2 doses if you smoke cigarettes or if you have certain conditions.  Meningococcal vaccine.** / Consult your health care provider.  Hepatitis A vaccine.** / Consult your health care provider.  Hepatitis B vaccine.** / Consult your health care provider.  Haemophilus  influenzae type b (Hib) vaccine.** / Consult your health care provider. Ages 4 years and over  Blood pressure check.** / Every year.  Lipid and cholesterol check.** / Every 5 years beginning at age 31 years.  Lung cancer screening. / Every year if you are aged 4-80 years and have a 30-pack-year history of smoking and currently smoke or have quit within the past 15 years. Yearly screening is stopped once you have quit smoking for at least 15 years or develop a health problem that would prevent you from having lung cancer treatment.  Clinical breast exam.** / Every year after age 15 years.  BRCA-related cancer risk assessment.** / For women who have family members with a BRCA-related cancer (breast, ovarian, tubal, or peritoneal cancers).  Mammogram.** / Every year beginning at age 41 years and continuing for as long as you are in good health. Consult with your health care provider.  Pap test.** / Every 3 years starting at age 4 years through age 50 or 30 years with 3 consecutive normal Pap tests. Testing can be stopped between 65 and 70 years with 3 consecutive normal Pap tests and no abnormal Pap or HPV tests in the past 10 years.  HPV screening.** / Every 3 years from ages 65 years through ages 78 or 26 years with a history of 3 consecutive normal Pap tests. Testing can be stopped between 65 and 70 years with 3 consecutive normal Pap tests and no abnormal Pap or HPV tests in the past 10 years.  Fecal occult blood test (FOBT) of stool. / Every year beginning at age 37 years and continuing until age 85 years. You may not need to do this test if you get a colonoscopy every 10 years.  Flexible sigmoidoscopy or colonoscopy.** / Every 5 years for a flexible sigmoidoscopy or every 10 years for a colonoscopy beginning at age 61 years and continuing until age 20 years.  Hepatitis C blood test.** / For all people born from [birth year] through 1967 and any individual with known risks for hepatitis  C.  Osteoporosis screening.** / A one-time screening for women ages 100 years and over and women at risk for fractures or osteoporosis.  Skin self-exam. / Monthly.  Influenza vaccine. / Every year.  Tetanus, diphtheria, and acellular pertussis (Tdap/Td) vaccine.** / 1 dose of Td every 10 years.  Varicella vaccine.** / Consult your health care provider.  Zoster vaccine.** / 1 dose for adults aged 91 years or older.  Pneumococcal 13-valent conjugate (PCV13) vaccine.** / Consult your health care provider.  Pneumococcal polysaccharide (PPSV23) vaccine.** / 1 dose for all adults aged 6 years and older.  Meningococcal vaccine.** / Consult your health care provider.  Hepatitis A vaccine.** / Consult your health care provider.  Hepatitis B vaccine.** / Consult your health care provider.  Haemophilus influenzae type b (Hib) vaccine.** / Consult your health care provider. ** Family history and personal history of risk and conditions may change your health care provider's recommendations.   This information is not intended to replace advice given to you by your health care provider. Make sure you discuss any  questions you have with your health care provider.   Document Released: 10/26/2001 Document Revised: 09/20/2014 Document Reviewed: 01/25/2011 Elsevier Interactive Patient Education [employer].

## 2016-07-20 NOTE — Assessment & Plan Note (Signed)
Reverse colostomy 11/17 pending Labs pre-op

## 2016-07-20 NOTE — Assessment & Plan Note (Signed)
ambien prn  Potential benefits of a long term benzodiazepines  use as well as potential risks  and complications were explained to the patient and were aknowledged.

## 2016-07-20 NOTE — Progress Notes (Signed)
Subjective:  Patient ID: Sue [name], female    DOB: [birth year]  Age: 65 y.o. MRN: [id number]  CC: No chief complaint on file.   HPI Sue [name] presents for a well exam. Surgery next week on Wed for a reverse colostomy procedure  Outpatient Medications Prior to Visit  Medication Sig Dispense Refill  . ALPRAZolam (XANAX) 0.25 MG tablet TAKE 1 TABLET BY MOUTH TWICE A DAY AS NEEDED FOR ANXIETY (Patient taking differently: Take 0.25 mg by mouth at bedtime as needed for sleep. TAKE 1 TABLET BY MOUTH TWICE A DAY AS NEEDED FOR ANXIETY) 60 tablet 2  . CALCIUM-MAGNESIUM-VITAMIN D PO Take 1 tablet by mouth 2 (two) times daily.    . Desoximetasone (TOPICORT) 0.25 % ointment Apply bid for aphtous ulcer 15 g 2  . diclofenac (VOLTAREN) 75 MG EC tablet Take 75 mg by mouth 2 (two) times daily as needed.  3  . Esterified Estrogens (MENEST) 0.3 MG tablet Take 0.15 mg by mouth every other day. take 1/2 tablet my mouth every other day     . ESTRACE VAGINAL 0.1 MG/GM vaginal cream Place 1 Applicatorful vaginally once a week.     Marland Kitchen GLUCOSAMINE HCL PO Take 1 tablet by mouth 2 (two) times daily.     Marland Kitchen HYDROcodone-acetaminophen (NORCO) 10-325 MG tablet Take 1 tablet by mouth every 6 (six) hours as needed for moderate pain or severe pain. 30 tablet 0  . Melatonin 3 MG TABS Take 3 mg by mouth at bedtime.    . metoprolol succinate (TOPROL-XL) 25 MG 24 hr tablet TAKE 1 TABLET BY MOUTH EVERY DAY (Patient taking differently: TAKE 12.5 MG IN THE MORNING AND 12.5 MG AT LUNCH) 90 tablet 0  . Multiple Vitamin (MULTIVITAMIN) capsule Take 1 capsule by mouth daily.      . Multiple Vitamins-Minerals (HAIR SKIN AND NAILS FORMULA PO) Take 1 tablet by mouth daily.    Ernestine Conrad 3-6-9 Fatty Acids (TRIPLE OMEGA COMPLEX PO) Take 1 capsule by mouth daily.    Marland Kitchen OVER THE COUNTER MEDICATION Take 1 tablet by mouth 2 (two) times daily.    . polyethylene glycol powder (MIRALAX) powder Mix one cap full with at least 8 oz of  liquid and drink once daily.    . Potassium Gluconate 550 MG TABS Take 1 tablet by mouth daily.    Marland Kitchen triamterene-hydrochlorothiazide (MAXZIDE-25) 37.5-25 MG tablet Take 1 tablet by mouth daily. 90 tablet 3  . VAYACOG 100-19.5-6.5 MG CAPS TAKE ONE CAPSULE BY MOUTH EVERY DAY 30 capsule 3  . XIIDRA 5 % SOLN Place 1 drop into both eyes 2 (two) times daily.  2  . zolpidem (AMBIEN) 5 MG tablet TAKE 1 TABLET BY MOUTH AT BEDTIME AS NEEDED FOR SLEEP 90 tablet 0   No facility-administered medications prior to visit.     ROS Review of Systems  Constitutional: Negative for activity change, appetite change, chills, fatigue and unexpected weight change.  HENT: Negative for congestion, mouth sores and sinus pressure.   Eyes: Negative for visual disturbance.  Respiratory: Negative for cough and chest tightness.   Gastrointestinal: Negative for abdominal pain and nausea.  Genitourinary: Negative for difficulty urinating, frequency and vaginal pain.  Musculoskeletal: Negative for back pain and gait problem.  Skin: Negative for pallor and rash.  Neurological: Negative for dizziness, tremors, weakness, numbness and headaches.  Psychiatric/Behavioral: Negative for confusion and sleep disturbance. The patient is not nervous/anxious.     Objective:  BP 140/80  Pulse (!) 59   Ht 5\' 4"  (1.626 m)   Wt 133 lb (60.3 kg)   SpO2 98%   BMI 22.83 kg/m   BP Readings from Last 3 Encounters:  07/20/16 140/80  02/18/16 (!) 155/80  02/05/16 118/60    Wt Readings from Last 3 Encounters:  07/20/16 133 lb (60.3 kg)  02/11/16 126 lb (57.2 kg)  02/05/16 130 lb (59 kg)    Physical Exam  Constitutional: She appears well-developed. No distress.  HENT:  Head: Normocephalic.  Right Ear: External ear normal.  Left Ear: External ear normal.  Nose: Nose normal.  Mouth/Throat: Oropharynx is clear and moist.  Eyes: Conjunctivae are normal. Pupils are equal, round, and reactive to light. Right eye exhibits no  discharge. Left eye exhibits no discharge.  Neck: Normal range of motion. Neck supple. No JVD present. No tracheal deviation present. No thyromegaly present.  Cardiovascular: Normal rate, regular rhythm and normal heart sounds.   Pulmonary/Chest: No stridor. No respiratory distress. She has no wheezes.  Abdominal: Soft. Bowel sounds are normal. She exhibits no distension and no mass. There is no tenderness. There is no rebound and no guarding.  Musculoskeletal: She exhibits no edema or tenderness.  Lymphadenopathy:    She has no cervical adenopathy.  Neurological: She displays normal reflexes. No cranial nerve deficit. She exhibits normal muscle tone. Coordination normal.  Skin: No rash noted. No erythema.  Psychiatric: She has a normal mood and affect. Her behavior is normal. Judgment and thought content normal.  L colostomy  Lab Results  Component Value Date   WBC 11.8 (H) 02/17/2016   HGB 10.2 (L) 02/17/2016   HCT 32.2 (L) 02/17/2016   PLT 583 (H) 02/17/2016   GLUCOSE 128 (H) 02/17/2016   CHOL 200 08/19/2015   TRIG 81.0 08/19/2015   HDL 60.60 08/19/2015   LDLDIRECT 118.2 07/19/2013   LDLCALC 123 (H) 08/19/2015   ALT 76 (H) 02/11/2016   AST 44 (H) 02/11/2016   NA 138 02/17/2016   K 4.2 02/17/2016   CL 98 (L) 02/17/2016   CREATININE 0.64 02/18/2016   BUN 5 (L) 02/17/2016   CO2 30 02/17/2016   TSH 2.69 08/19/2015    Mm Screening Breast Tomo Bilateral  Result Date: 06/24/2016 CLINICAL DATA:  Screening. EXAM: 2D DIGITAL SCREENING BILATERAL MAMMOGRAM WITH CAD AND ADJUNCT TOMO COMPARISON:  Previous exam(s). ACR Breast Density Category c: The breast tissue is heterogeneously dense, which may obscure small masses. FINDINGS: There are no findings suspicious for malignancy. Images were processed with CAD. IMPRESSION: No mammographic evidence of malignancy. A result letter of this screening mammogram will be mailed directly to the patient. RECOMMENDATION: Screening mammogram in one  year. (Code:SM-B-01Y) BI-RADS CATEGORY  1: Negative. Electronically Signed   By: Fidela Salisbury M.D.   On: 06/24/2016 11:19    Assessment & Plan:   There are no diagnoses linked to this encounter. I am having Ms. Tomes maintain her GLUCOSAMINE HCL PO, Esterified Estrogens, multivitamin, Desoximetasone, ESTRACE VAGINAL, triamterene-hydrochlorothiazide, ALPRAZolam, HYDROcodone-acetaminophen, polyethylene glycol powder, zolpidem, VAYACOG, metoprolol succinate, diclofenac, XIIDRA, OVER THE COUNTER MEDICATION, Omega 3-6-9 Fatty Acids (TRIPLE OMEGA COMPLEX PO), Melatonin, CALCIUM-MAGNESIUM-VITAMIN D PO, Potassium Gluconate, and Multiple Vitamins-Minerals (HAIR SKIN AND NAILS FORMULA PO).  No orders of the defined types were placed in this encounter.    Follow-up: No Follow-up on file.  Walker Kehr, MD

## 2016-07-21 NOTE — Pre-Procedure Instructions (Signed)
Sue [name]  07/21/2016      [id number] - [place], [place] [id number] [place] [id number] Phone: [phone] Fax: [phone]  [facility] #[id number] - [place], [place] [address] [place] [address] Phone: [phone] Fax: [phone]    Your procedure is scheduled on Wednesday November 15.  Report to [facility] Admitting at 6:30 A.M.  Call this number if you have problems the morning of surgery:  [phone]   Remember:  Do not eat food or drink liquids after midnight.  Take these medicines the morning of surgery with A SIP OF WATER: Xanax if needed, Menest, metoprolol (Toprol XL)  7 days prior to surgery STOP taking any Aspirin, diclofenac (Voltaren), Aleve, Naproxen, Ibuprofen, Motrin, Advil, Goody's, BC's, all herbal medications, fish oil, and all vitamins    Do not wear jewelry, make-up or nail polish.  Do not wear lotions, powders, or perfumes, or deoderant.  Do not shave 48 hours prior to surgery.  Men may shave face and neck.  Do not bring valuables to the hospital.  [facility] is not responsible for any belongings or valuables.  Contacts, dentures or bridgework may not be worn into surgery.  Leave your suitcase in the car.  After surgery it may be brought to your room.  For patients admitted to the hospital, discharge time will be determined by your treatment team.  Patients discharged the day of surgery will not be allowed to drive home.    Special instructions:    [place]- Preparing For Surgery  Before surgery, you can play an important role. Because skin is not sterile, your skin needs to be as free of germs as possible. You can reduce the number of germs on your skin by washing with CHG (chlorahexidine gluconate) Soap before surgery.  CHG is an antiseptic cleaner which kills germs and bonds  with the skin to continue killing germs even after washing.  Please do not use if you have an allergy to CHG or antibacterial soaps. If your skin becomes reddened/irritated stop using the CHG.  Do not shave (including legs and underarms) for at least 48 hours prior to first CHG shower. It is OK to shave your face.  Please follow these instructions carefully.   1. Shower the NIGHT BEFORE SURGERY and the MORNING OF SURGERY with CHG.   2. If you chose to wash your hair, wash your hair first as usual with your normal shampoo.  3. After you shampoo, rinse your hair and body thoroughly to remove the shampoo.  4. Use CHG as you would any other liquid soap. You can apply CHG directly to the skin and wash gently with a scrungie or a clean washcloth.   5. Apply the CHG Soap to your body ONLY FROM THE NECK DOWN.  Do not use on open wounds or open sores. Avoid contact with your eyes, ears, mouth and genitals (private parts). Wash genitals (private parts) with your normal soap.  6. Wash thoroughly, paying special attention to the area where your surgery will be performed.  7. Thoroughly rinse your body with warm water from the neck down.  8. DO NOT shower/wash with your normal soap after using and rinsing off the CHG Soap.  9. Pat yourself dry with a CLEAN TOWEL.   10. Wear CLEAN PAJAMAS   11.  Place CLEAN SHEETS on your bed the night of your first shower and DO NOT SLEEP WITH PETS.    Day of Surgery: Do not apply any deodorants/lotions. Please wear clean clothes to the hospital/surgery center.

## 2016-07-22 ENCOUNTER — Encounter (HOSPITAL_COMMUNITY)
Admission: RE | Admit: 2016-07-22 | Discharge: 2016-07-22 | Disposition: A | Discharge status: Home / Self Care | Payer: Medicare Other | Source: Ambulatory Visit | Attending: General Surgery | Admitting: General Surgery

## 2016-07-22 ENCOUNTER — Encounter (HOSPITAL_COMMUNITY): Payer: Self-pay

## 2016-07-22 DIAGNOSIS — Z01818 Encounter for other preprocedural examination: Secondary | ICD-10-CM | POA: Insufficient documentation

## 2016-07-22 DIAGNOSIS — I10 Essential (primary) hypertension: Secondary | ICD-10-CM | POA: Insufficient documentation

## 2016-07-22 HISTORY — DX: Cardiac murmur, unspecified: R01.1

## 2016-07-22 LAB — CBC WITH DIFFERENTIAL/PLATELET
BASOS PCT: 0 %
Basophils Absolute: 0 10*3/uL (ref 0.0–0.1)
EOS ABS: 0.2 10*3/uL (ref 0.0–0.7)
Eosinophils Relative: 3 %
HCT: 43.9 % (ref 36.0–46.0)
HEMOGLOBIN: 14.5 g/dL (ref 12.0–15.0)
Lymphocytes Relative: 37 %
Lymphs Abs: 2.8 10*3/uL (ref 0.7–4.0)
MCH: 29.7 pg (ref 26.0–34.0)
MCHC: 33 g/dL (ref 30.0–36.0)
MCV: 89.8 fL (ref 78.0–100.0)
MONOS PCT: 6 %
Monocytes Absolute: 0.5 10*3/uL (ref 0.1–1.0)
NEUTROS PCT: 54 %
Neutro Abs: 3.9 10*3/uL (ref 1.7–7.7)
Platelets: 316 10*3/uL (ref 150–400)
RBC: 4.89 MIL/uL (ref 3.87–5.11)
RDW: 14.7 % (ref 11.5–15.5)
WBC: 7.4 10*3/uL (ref 4.0–10.5)

## 2016-07-22 LAB — BASIC METABOLIC PANEL
Anion gap: 11 (ref 5–15)
BUN: 14 mg/dL (ref 6–20)
CALCIUM: 10.2 mg/dL (ref 8.9–10.3)
CO2: 27 mmol/L (ref 22–32)
CREATININE: 0.75 mg/dL (ref 0.44–1.00)
Chloride: 101 mmol/L (ref 101–111)
GFR calc non Af Amer: >60 mL/min (ref >60)
Glucose, Bld: 97 mg/dL (ref 65–99)
Potassium: 4.1 mmol/L (ref 3.5–5.1)
SODIUM: 139 mmol/L (ref 135–145)

## 2016-07-22 NOTE — Progress Notes (Signed)
PCP: Narda Bonds No cardiologist or cardiac workup. Pt reports possible stress test in sometimes between 2000-2010  Echo: 09/16/2006 EKG: 07/22/16  No complaints of chest pain, SOB or signs of infection at PAT appointment.

## 2016-07-27 MED ORDER — METRONIDAZOLE IN NACL 5-0.79 MG/ML-% IV SOLN
500.0000 mg | INTRAVENOUS | Status: AC
  Administered 2016-07-28: 500 mg via INTRAVENOUS
  Filled 2016-07-27: qty 100

## 2016-07-27 MED ORDER — CIPROFLOXACIN IN D5W 400 MG/200ML IV SOLN
400.0000 mg | INTRAVENOUS | Status: AC
  Administered 2016-07-28: 400 mg via INTRAVENOUS
  Filled 2016-07-27: qty 200

## 2016-07-28 ENCOUNTER — Inpatient Hospital Stay (HOSPITAL_COMMUNITY): Payer: Medicare Other | Admitting: Anesthesiology

## 2016-07-28 ENCOUNTER — Encounter (HOSPITAL_COMMUNITY)
Admission: RE | Disposition: A | Discharge status: Home / Self Care | Payer: Self-pay | Source: Ambulatory Visit | Attending: General Surgery

## 2016-07-28 ENCOUNTER — Encounter (HOSPITAL_COMMUNITY): Payer: Self-pay | Admitting: Anesthesiology

## 2016-07-28 ENCOUNTER — Inpatient Hospital Stay (HOSPITAL_COMMUNITY)
Admission: RE | Admit: 2016-07-28 | Discharge: 2016-08-02 | DRG: 331 | Disposition: A | Discharge status: Home / Self Care | Payer: Medicare Other | Source: Ambulatory Visit | Attending: General Surgery | Admitting: General Surgery

## 2016-07-28 DIAGNOSIS — Z433 Encounter for attention to colostomy: Principal | ICD-10-CM

## 2016-07-28 DIAGNOSIS — Z5331 Laparoscopic surgical procedure converted to open procedure: Secondary | ICD-10-CM | POA: Diagnosis not present

## 2016-07-28 DIAGNOSIS — Z91040 Latex allergy status: Secondary | ICD-10-CM | POA: Diagnosis not present

## 2016-07-28 DIAGNOSIS — Z8719 Personal history of other diseases of the digestive system: Secondary | ICD-10-CM

## 2016-07-28 DIAGNOSIS — M1990 Unspecified osteoarthritis, unspecified site: Secondary | ICD-10-CM | POA: Diagnosis present

## 2016-07-28 DIAGNOSIS — I10 Essential (primary) hypertension: Secondary | ICD-10-CM | POA: Diagnosis present

## 2016-07-28 DIAGNOSIS — Z880 Allergy status to penicillin: Secondary | ICD-10-CM

## 2016-07-28 DIAGNOSIS — I341 Nonrheumatic mitral (valve) prolapse: Secondary | ICD-10-CM | POA: Diagnosis not present

## 2016-07-28 DIAGNOSIS — K660 Peritoneal adhesions (postprocedural) (postinfection): Secondary | ICD-10-CM | POA: Diagnosis present

## 2016-07-28 DIAGNOSIS — K5732 Diverticulitis of large intestine without perforation or abscess without bleeding: Secondary | ICD-10-CM | POA: Diagnosis present

## 2016-07-28 HISTORY — PX: COLON RESECTION: SHX5231

## 2016-07-28 HISTORY — PX: COLOSTOMY TAKEDOWN: SHX5783

## 2016-07-28 LAB — CBC
HCT: 41.4 % (ref 36.0–46.0)
HEMOGLOBIN: 14.1 g/dL (ref 12.0–15.0)
MCH: 30.2 pg (ref 26.0–34.0)
MCHC: 34.1 g/dL (ref 30.0–36.0)
MCV: 88.7 fL (ref 78.0–100.0)
PLATELETS: 297 10*3/uL (ref 150–400)
RBC: 4.67 MIL/uL (ref 3.87–5.11)
RDW: 14.5 % (ref 11.5–15.5)
WBC: 14.6 10*3/uL — ABNORMAL HIGH (ref 4.0–10.5)

## 2016-07-28 LAB — CREATININE, SERUM
CREATININE: 0.86 mg/dL (ref 0.44–1.00)
GFR calc Af Amer: >60 mL/min (ref >60)
GFR calc non Af Amer: >60 mL/min (ref >60)

## 2016-07-28 SURGERY — COLON RESECTION LAPAROSCOPIC
Anesthesia: General | Site: Abdomen

## 2016-07-28 MED ORDER — HYDROMORPHONE HCL 2 MG/ML IJ SOLN: 0.5000 mg | INTRAMUSCULAR | Status: DC | PRN

## 2016-07-28 MED ORDER — SODIUM CHLORIDE 0.9% FLUSH: 9.0000 mL | INTRAVENOUS | Status: DC | PRN

## 2016-07-28 MED ORDER — GABAPENTIN 300 MG PO CAPS
300.0000 mg | ORAL_CAPSULE | ORAL | Status: AC
  Administered 2016-07-28: 300 mg via ORAL
  Filled 2016-07-28: qty 1

## 2016-07-28 MED ORDER — LIDOCAINE 2% (20 MG/ML) 5 ML SYRINGE
INTRAMUSCULAR | Status: AC
  Filled 2016-07-28: qty 5

## 2016-07-28 MED ORDER — KETOROLAC TROMETHAMINE 15 MG/ML IJ SOLN: 15.0000 mg | Freq: Four times a day (QID) | INTRAMUSCULAR | Status: DC | PRN

## 2016-07-28 MED ORDER — MORPHINE SULFATE 2 MG/ML IV SOLN
INTRAVENOUS | Status: DC
  Administered 2016-07-28: 14:00:00 via INTRAVENOUS
  Administered 2016-07-28: 1 mg via INTRAVENOUS
  Administered 2016-07-28: 0 mg via INTRAVENOUS
  Administered 2016-07-29 (×2): 1 mg via INTRAVENOUS
  Administered 2016-07-29: 2 mg via INTRAVENOUS
  Administered 2016-07-29: 5 mg via INTRAVENOUS
  Administered 2016-07-29: 1 mg via INTRAVENOUS

## 2016-07-28 MED ORDER — PROMETHAZINE HCL 25 MG/ML IJ SOLN: 6.2500 mg | INTRAMUSCULAR | Status: DC | PRN

## 2016-07-28 MED ORDER — ACETAMINOPHEN 650 MG RE SUPP: 650.0000 mg | Freq: Four times a day (QID) | RECTAL | Status: DC | PRN

## 2016-07-28 MED ORDER — MORPHINE SULFATE (PF) 4 MG/ML IV SOLN
INTRAVENOUS | Status: AC
  Filled 2016-07-28: qty 1

## 2016-07-28 MED ORDER — BUPIVACAINE HCL (PF) 0.25 % IJ SOLN
INTRAMUSCULAR | Status: AC
  Filled 2016-07-28: qty 30

## 2016-07-28 MED ORDER — ACETAMINOPHEN 325 MG PO TABS
650.0000 mg | ORAL_TABLET | Freq: Four times a day (QID) | ORAL | Status: DC | PRN
  Administered 2016-07-29 – 2016-08-01 (×3): 650 mg via ORAL
  Filled 2016-07-28 (×4): qty 2

## 2016-07-28 MED ORDER — ACETAMINOPHEN 500 MG PO TABS
1000.0000 mg | ORAL_TABLET | ORAL | Status: AC
  Administered 2016-07-28: 1000 mg via ORAL
  Filled 2016-07-28: qty 2

## 2016-07-28 MED ORDER — LACTATED RINGERS IV SOLN
INTRAVENOUS | Status: DC | PRN
  Administered 2016-07-28 (×3): via INTRAVENOUS

## 2016-07-28 MED ORDER — ROCURONIUM BROMIDE 10 MG/ML (PF) SYRINGE
PREFILLED_SYRINGE | INTRAVENOUS | Status: AC
  Filled 2016-07-28: qty 10

## 2016-07-28 MED ORDER — HYDROMORPHONE HCL 1 MG/ML IJ SOLN: 0.5000 mg | INTRAMUSCULAR | Status: DC | PRN

## 2016-07-28 MED ORDER — MORPHINE SULFATE (PF) 4 MG/ML IV SOLN
2.0000 mg | INTRAVENOUS | Status: DC | PRN
  Administered 2016-07-28 (×3): 2 mg via INTRAVENOUS

## 2016-07-28 MED ORDER — HEPARIN SODIUM (PORCINE) 5000 UNIT/ML IJ SOLN
5000.0000 [IU] | Freq: Once | INTRAMUSCULAR | Status: AC
  Administered 2016-07-28: 5000 [IU] via SUBCUTANEOUS
  Filled 2016-07-28: qty 1

## 2016-07-28 MED ORDER — DEXAMETHASONE SODIUM PHOSPHATE 10 MG/ML IJ SOLN
INTRAMUSCULAR | Status: AC
  Filled 2016-07-28: qty 1

## 2016-07-28 MED ORDER — ONDANSETRON HCL 4 MG/2ML IJ SOLN
INTRAMUSCULAR | Status: AC
  Filled 2016-07-28: qty 2

## 2016-07-28 MED ORDER — PROPOFOL 10 MG/ML IV BOLUS
INTRAVENOUS | Status: DC | PRN
Start: 2016-07-28 — End: 2016-07-28
  Administered 2016-07-28: 150 mg via INTRAVENOUS

## 2016-07-28 MED ORDER — ENOXAPARIN SODIUM 40 MG/0.4ML ~~LOC~~ SOLN
40.0000 mg | SUBCUTANEOUS | Status: DC
  Administered 2016-07-29 – 2016-08-01 (×4): 40 mg via SUBCUTANEOUS
  Filled 2016-07-28 (×5): qty 0.4

## 2016-07-28 MED ORDER — MIDAZOLAM HCL 2 MG/2ML IJ SOLN
INTRAMUSCULAR | Status: AC
  Filled 2016-07-28: qty 2

## 2016-07-28 MED ORDER — KETOROLAC TROMETHAMINE 15 MG/ML IJ SOLN
15.0000 mg | Freq: Four times a day (QID) | INTRAMUSCULAR | Status: AC
  Administered 2016-07-28: 15 mg via INTRAVENOUS

## 2016-07-28 MED ORDER — DIPHENHYDRAMINE HCL 50 MG/ML IJ SOLN: 12.5000 mg | Freq: Four times a day (QID) | INTRAMUSCULAR | Status: DC | PRN

## 2016-07-28 MED ORDER — CHLORHEXIDINE GLUCONATE CLOTH 2 % EX PADS: 6.0000 | MEDICATED_PAD | Freq: Once | CUTANEOUS | Status: DC

## 2016-07-28 MED ORDER — HYDROMORPHONE HCL 1 MG/ML IJ SOLN
0.2500 mg | INTRAMUSCULAR | Status: DC | PRN
  Administered 2016-07-28 (×4): 0.5 mg via INTRAVENOUS

## 2016-07-28 MED ORDER — BUPIVACAINE HCL 0.25 % IJ SOLN
INTRAMUSCULAR | Status: DC | PRN
  Administered 2016-07-28: 6 mL

## 2016-07-28 MED ORDER — ZOLPIDEM TARTRATE 5 MG PO TABS: 5.0000 mg | ORAL_TABLET | Freq: Every evening | ORAL | Status: DC | PRN

## 2016-07-28 MED ORDER — ONDANSETRON HCL 4 MG/2ML IJ SOLN
4.0000 mg | Freq: Four times a day (QID) | INTRAMUSCULAR | Status: DC | PRN
  Administered 2016-07-29: 4 mg via INTRAVENOUS
  Filled 2016-07-28: qty 2

## 2016-07-28 MED ORDER — ALVIMOPAN 12 MG PO CAPS
12.0000 mg | ORAL_CAPSULE | Freq: Once | ORAL | Status: AC
  Administered 2016-07-28: 12 mg via ORAL
  Filled 2016-07-28: qty 1

## 2016-07-28 MED ORDER — DIPHENHYDRAMINE HCL 12.5 MG/5ML PO ELIX: 12.5000 mg | ORAL_SOLUTION | Freq: Four times a day (QID) | ORAL | Status: DC | PRN

## 2016-07-28 MED ORDER — KCL IN DEXTROSE-NACL 20-5-0.45 MEQ/L-%-% IV SOLN
INTRAVENOUS | Status: DC
  Administered 2016-07-28 – 2016-07-29 (×2): via INTRAVENOUS
  Filled 2016-07-28 (×3): qty 1000

## 2016-07-28 MED ORDER — SODIUM CHLORIDE 0.9 % IR SOLN
Status: DC | PRN
  Administered 2016-07-28: 1

## 2016-07-28 MED ORDER — PROPOFOL 10 MG/ML IV BOLUS
INTRAVENOUS | Status: AC
  Filled 2016-07-28: qty 40

## 2016-07-28 MED ORDER — ONDANSETRON HCL 4 MG/2ML IJ SOLN: 4.0000 mg | Freq: Four times a day (QID) | INTRAMUSCULAR | Status: DC | PRN

## 2016-07-28 MED ORDER — MORPHINE SULFATE (PF) 4 MG/ML IV SOLN
INTRAVENOUS | Status: AC
Start: 2016-07-28 — End: 2016-07-29
  Filled 2016-07-28: qty 1

## 2016-07-28 MED ORDER — HYDROMORPHONE HCL 1 MG/ML IJ SOLN
INTRAMUSCULAR | Status: AC
  Filled 2016-07-28: qty 2

## 2016-07-28 MED ORDER — KETOROLAC TROMETHAMINE 15 MG/ML IJ SOLN
INTRAMUSCULAR | Status: AC
  Filled 2016-07-28: qty 1

## 2016-07-28 MED ORDER — ROCURONIUM BROMIDE 100 MG/10ML IV SOLN
INTRAVENOUS | Status: DC | PRN
  Administered 2016-07-28 (×2): 20 mg via INTRAVENOUS
  Administered 2016-07-28: 50 mg via INTRAVENOUS

## 2016-07-28 MED ORDER — ALPRAZOLAM 0.25 MG PO TABS: 0.2500 mg | ORAL_TABLET | Freq: Every evening | ORAL | Status: DC | PRN

## 2016-07-28 MED ORDER — HYDROCODONE-ACETAMINOPHEN 5-325 MG PO TABS
1.0000 | ORAL_TABLET | ORAL | Status: DC | PRN
Start: 2016-07-28 — End: 2016-08-02
  Administered 2016-07-30 – 2016-08-02 (×8): 1 via ORAL
  Filled 2016-07-28 (×3): qty 1
  Filled 2016-07-28: qty 2
  Filled 2016-07-28 (×3): qty 1
  Filled 2016-07-28: qty 2
  Filled 2016-07-28: qty 1

## 2016-07-28 MED ORDER — DEXAMETHASONE SODIUM PHOSPHATE 10 MG/ML IJ SOLN
INTRAMUSCULAR | Status: DC | PRN
  Administered 2016-07-28: 10 mg via INTRAVENOUS

## 2016-07-28 MED ORDER — SUGAMMADEX SODIUM 200 MG/2ML IV SOLN
INTRAVENOUS | Status: AC
  Filled 2016-07-28: qty 2

## 2016-07-28 MED ORDER — FENTANYL CITRATE (PF) 100 MCG/2ML IJ SOLN
INTRAMUSCULAR | Status: AC
  Filled 2016-07-28: qty 2

## 2016-07-28 MED ORDER — ALVIMOPAN 12 MG PO CAPS
12.0000 mg | ORAL_CAPSULE | Freq: Two times a day (BID) | ORAL | Status: DC
  Administered 2016-07-29: 12 mg via ORAL
  Filled 2016-07-28 (×2): qty 1

## 2016-07-28 MED ORDER — METOPROLOL SUCCINATE ER 25 MG PO TB24
25.0000 mg | ORAL_TABLET | Freq: Every day | ORAL | Status: DC
  Filled 2016-07-28: qty 1

## 2016-07-28 MED ORDER — EPHEDRINE SULFATE 50 MG/ML IJ SOLN
INTRAMUSCULAR | Status: DC | PRN
  Administered 2016-07-28: 10 mg via INTRAVENOUS

## 2016-07-28 MED ORDER — 0.9 % SODIUM CHLORIDE (POUR BTL) OPTIME
TOPICAL | Status: DC | PRN
  Administered 2016-07-28: 2000 mL

## 2016-07-28 MED ORDER — NALOXONE HCL 0.4 MG/ML IJ SOLN: 0.4000 mg | INTRAMUSCULAR | Status: DC | PRN

## 2016-07-28 MED ORDER — CELECOXIB 200 MG PO CAPS
400.0000 mg | ORAL_CAPSULE | ORAL | Status: AC
  Administered 2016-07-28: 400 mg via ORAL
  Filled 2016-07-28: qty 2

## 2016-07-28 MED ORDER — FENTANYL CITRATE (PF) 100 MCG/2ML IJ SOLN
INTRAMUSCULAR | Status: DC | PRN
  Administered 2016-07-28 (×3): 50 ug via INTRAVENOUS

## 2016-07-28 MED ORDER — LIDOCAINE HCL (CARDIAC) 20 MG/ML IV SOLN
INTRAVENOUS | Status: DC | PRN
  Administered 2016-07-28: 70 mg via INTRAVENOUS

## 2016-07-28 MED ORDER — ONDANSETRON 4 MG PO TBDP: 4.0000 mg | ORAL_TABLET | Freq: Four times a day (QID) | ORAL | Status: DC | PRN

## 2016-07-28 MED ORDER — MORPHINE SULFATE 2 MG/ML IV SOLN
INTRAVENOUS | Status: AC
  Filled 2016-07-28: qty 25

## 2016-07-28 MED ORDER — EPHEDRINE 5 MG/ML INJ
INTRAVENOUS | Status: AC
  Filled 2016-07-28: qty 10

## 2016-07-28 MED ORDER — LIFITEGRAST 5 % OP SOLN: 1.0000 [drp] | Freq: Two times a day (BID) | OPHTHALMIC | Status: DC

## 2016-07-28 MED ORDER — SUGAMMADEX SODIUM 200 MG/2ML IV SOLN
INTRAVENOUS | Status: DC | PRN
  Administered 2016-07-28: 120 mg via INTRAVENOUS

## 2016-07-28 MED ORDER — ARTIFICIAL TEARS OP OINT
TOPICAL_OINTMENT | OPHTHALMIC | Status: AC
  Filled 2016-07-28: qty 3.5

## 2016-07-28 MED ORDER — SIMETHICONE 80 MG PO CHEW
40.0000 mg | CHEWABLE_TABLET | Freq: Four times a day (QID) | ORAL | Status: DC | PRN
  Administered 2016-07-29 – 2016-08-02 (×2): 40 mg via ORAL
  Filled 2016-07-28 (×2): qty 1

## 2016-07-28 MED ORDER — ONDANSETRON HCL 4 MG/2ML IJ SOLN
INTRAMUSCULAR | Status: DC | PRN
  Administered 2016-07-28: 4 mg via INTRAVENOUS

## 2016-07-28 MED ORDER — MIDAZOLAM HCL 5 MG/5ML IJ SOLN
INTRAMUSCULAR | Status: DC | PRN
  Administered 2016-07-28 (×2): 1 mg via INTRAVENOUS

## 2016-07-28 SURGICAL SUPPLY — 83 items
APPLIER CLIP 5 13 M/L LIGAMAX5 (MISCELLANEOUS)
APPLIER CLIP ROT 10 11.4 M/L (STAPLE)
BIOPATCH RED 1 DISK 7.0 (GAUZE/BANDAGES/DRESSINGS) ×2 IMPLANT
BLADE SURG ROTATE 9660 (MISCELLANEOUS) IMPLANT
CANISTER SUCTION 2500CC (MISCELLANEOUS) ×2 IMPLANT
CELLS DAT CNTRL 66122 CELL SVR (MISCELLANEOUS) IMPLANT
CHLORAPREP W/TINT 26ML (MISCELLANEOUS) ×2 IMPLANT
CLIP APPLIE 5 13 M/L LIGAMAX5 (MISCELLANEOUS) IMPLANT
CLIP APPLIE ROT 10 11.4 M/L (STAPLE) IMPLANT
COVER MAYO STAND STRL (DRAPES) ×2 IMPLANT
COVER SURGICAL LIGHT HANDLE (MISCELLANEOUS) ×4 IMPLANT
DRAIN CHANNEL 19F RND (DRAIN) ×2 IMPLANT
DRAPE PROXIMA HALF (DRAPES) IMPLANT
DRAPE UTILITY XL STRL (DRAPES) ×2 IMPLANT
DRSG OPSITE POSTOP 4X10 (GAUZE/BANDAGES/DRESSINGS) IMPLANT
DRSG OPSITE POSTOP 4X8 (GAUZE/BANDAGES/DRESSINGS) ×2 IMPLANT
DRSG TEGADERM 2-3/8X2-3/4 SM (GAUZE/BANDAGES/DRESSINGS) ×4 IMPLANT
DRSG TEGADERM 4X4.75 (GAUZE/BANDAGES/DRESSINGS) ×2 IMPLANT
ELECT BLADE 6.5 EXT (BLADE) IMPLANT
ELECT CAUTERY BLADE 6.4 (BLADE) ×4 IMPLANT
ELECT REM PT RETURN 9FT ADLT (ELECTROSURGICAL) ×2
ELECTRODE REM PT RTRN 9FT ADLT (ELECTROSURGICAL) ×1 IMPLANT
EVACUATOR SILICONE 100CC (DRAIN) ×2 IMPLANT
GAUZE XEROFORM 5X9 LF (GAUZE/BANDAGES/DRESSINGS) ×2 IMPLANT
GEL ULTRASOUND 20GR AQUASONIC (MISCELLANEOUS) IMPLANT
GLOVE BIOGEL PI IND STRL 7.0 (GLOVE) ×2 IMPLANT
GLOVE BIOGEL PI IND STRL 7.5 (GLOVE) ×2 IMPLANT
GLOVE BIOGEL PI IND STRL 8 (GLOVE) ×1 IMPLANT
GLOVE BIOGEL PI INDICATOR 7.0 (GLOVE) ×2
GLOVE BIOGEL PI INDICATOR 7.5 (GLOVE) ×2
GLOVE BIOGEL PI INDICATOR 8 (GLOVE) ×1
GLOVE INDICATOR 7.5 STRL GRN (GLOVE) ×4 IMPLANT
GLOVE SURG SS PI 7.0 STRL IVOR (GLOVE) ×12 IMPLANT
GLOVE SURG SS PI 7.5 STRL IVOR (GLOVE) ×4 IMPLANT
GLOVE SURG SS PI 8.0 STRL IVOR (GLOVE) ×4 IMPLANT
GOWN STRL REUS W/ TWL LRG LVL3 (GOWN DISPOSABLE) ×6 IMPLANT
GOWN STRL REUS W/ TWL XL LVL3 (GOWN DISPOSABLE) ×1 IMPLANT
GOWN STRL REUS W/TWL LRG LVL3 (GOWN DISPOSABLE) ×6
GOWN STRL REUS W/TWL XL LVL3 (GOWN DISPOSABLE) ×1
KIT BASIN OR (CUSTOM PROCEDURE TRAY) ×2 IMPLANT
KIT ROOM TURNOVER OR (KITS) ×2 IMPLANT
LEGGING LITHOTOMY PAIR STRL (DRAPES) ×4 IMPLANT
LIGASURE MARYLAND LAP STAND (ELECTROSURGICAL) IMPLANT
NS IRRIG 1000ML POUR BTL (IV SOLUTION) ×4 IMPLANT
PAD ABD 8X10 STRL (GAUZE/BANDAGES/DRESSINGS) ×2 IMPLANT
PAD ARMBOARD 7.5X6 YLW CONV (MISCELLANEOUS) ×4 IMPLANT
PENCIL BUTTON HOLSTER BLD 10FT (ELECTRODE) ×4 IMPLANT
RTRCTR WOUND ALEXIS 18CM MED (MISCELLANEOUS)
SCALPEL HARMONIC ACE (MISCELLANEOUS) ×2 IMPLANT
SCISSORS LAP 5X35 DISP (ENDOMECHANICALS) ×2 IMPLANT
SEALER TISSUE X1 CVD JAW (INSTRUMENTS) IMPLANT
SET IRRIG TUBING LAPAROSCOPIC (IRRIGATION / IRRIGATOR) ×2 IMPLANT
SLEEVE ENDOPATH XCEL 5M (ENDOMECHANICALS) ×6 IMPLANT
SPECIMEN JAR LARGE (MISCELLANEOUS) ×2 IMPLANT
SPONGE GAUZE 4X4 12PLY STER LF (GAUZE/BANDAGES/DRESSINGS) ×2 IMPLANT
SPONGE LAP 18X18 X RAY DECT (DISPOSABLE) ×2 IMPLANT
STAPLER CIRC ILS CVD 33MM 37CM (STAPLE) ×2 IMPLANT
STAPLER VISISTAT 35W (STAPLE) ×4 IMPLANT
SURGILUBE 2OZ TUBE FLIPTOP (MISCELLANEOUS) IMPLANT
SUT ETHILON 2 0 FS 18 (SUTURE) ×4 IMPLANT
SUT PDS AB 0 CT 36 (SUTURE) ×6 IMPLANT
SUT PROLENE 2 0 CT2 30 (SUTURE) IMPLANT
SUT PROLENE 2 0 KS (SUTURE) ×2 IMPLANT
SUT SILK 2 0 SH CR/8 (SUTURE) ×2 IMPLANT
SUT SILK 2 0 TIES 10X30 (SUTURE) ×2 IMPLANT
SUT SILK 3 0 SH CR/8 (SUTURE) ×2 IMPLANT
SUT SILK 3 0 TIES 10X30 (SUTURE) ×2 IMPLANT
SUT SILK 3 0SH CR/8 30 (SUTURE) ×2 IMPLANT
SYR BULB IRRIGATION 50ML (SYRINGE) ×2 IMPLANT
SYS LAPSCP GELPORT 120MM (MISCELLANEOUS)
SYSTEM LAPSCP GELPORT 120MM (MISCELLANEOUS) IMPLANT
TAPE CLOTH SURG 4X10 WHT LF (GAUZE/BANDAGES/DRESSINGS) ×2 IMPLANT
TOWEL OR 17X26 10 PK STRL BLUE (TOWEL DISPOSABLE) ×4 IMPLANT
TRAY FOLEY CATH 16FRSI W/METER (SET/KITS/TRAYS/PACK) ×2 IMPLANT
TRAY LAPAROSCOPIC MC (CUSTOM PROCEDURE TRAY) ×2 IMPLANT
TRAY PROCTOSCOPIC FIBER OPTIC (SET/KITS/TRAYS/PACK) IMPLANT
TROCAR XCEL 12X100 BLDLESS (ENDOMECHANICALS) ×2 IMPLANT
TROCAR XCEL BLUNT TIP 100MML (ENDOMECHANICALS) IMPLANT
TROCAR XCEL NON-BLD 11X100MML (ENDOMECHANICALS) IMPLANT
TROCAR XCEL NON-BLD 5MMX100MML (ENDOMECHANICALS) ×2 IMPLANT
TUBE CONNECTING 12X1/4 (SUCTIONS) ×4 IMPLANT
TUBING FILTER THERMOFLATOR (ELECTROSURGICAL) ×2 IMPLANT
YANKAUER SUCT BULB TIP NO VENT (SUCTIONS) ×4 IMPLANT

## 2016-07-28 NOTE — Progress Notes (Signed)
Sue [name] is a 65 y.o. female patient admitted from ED awake, alert - oriented  X 4 - no acute distress noted.  VSS - Blood pressure (!) 161/79, pulse 68, temperature 97.5 F (36.4 C), temperature source Oral, resp. rate 16, height 5' 4.25" (1.632 m), weight 59.9 kg (132 lb), SpO2 100 %.    IV in place, occlusive dsg intact without redness.  Orientation to room, and floor completed.  Admission INP armband ID verified with patient/family, and in place.   SR up x 2, fall assessment complete, with patient and family able to verbalize understanding of risk associated with falls, and verbalized understanding to call nsg before up out of bed.  Call light within reach, patient able to voice, and demonstrate understanding. No evidence of skin break down noted on exam.  Admission nurse notified of admission.     Will cont to eval and treat per MD orders.  Theora Master, RN 07/28/2016 4:56 PM

## 2016-07-28 NOTE — Op Note (Addendum)
Preoperative diagnosis: diverticulitis  Postoperative diagnosis: same   Procedure: laparoscopic converted to open take down of end colostomy with colo-rectostomy  Surgeon: Gurney Maxin, M.D.  Asst: Verita Lamb, M.D.  Anesthesia: general  Indications for procedure: Sue [name] is a 65 y.o. year old female with symptoms of diverticulitis with perforation, she underwent open Hartman colectomy several months ago and did well in the postoperative phase. She presents today for takedown of her colostomy with anastomosis.  Description of procedure: The patient was brought into the operative suite. Anesthesia was administered with General endotracheal anesthesia. WHO checklist was applied. The patient was then placed in the lithotomy with both arms tucked position. Figure-of-eight stitch was placed through the colostomy close. The area was prepped and draped in the usual sterile fashion.   Next a transverse incision was made in the right subcostal area. A 5 mm optical entry trocar was used to gain access to the peritoneum. Pneumoperitoneum was applied with high flow low pressure laparoscope was reinserted to confirm position. 2 additional 5 mm trochars are placed on the left mid abdomen on the left lower abdomen. The colostomy had minimal adhesions to the area. The lower midline area had multiple thin and medium thick adhesions of small intestine to the anterior abdominal wall. These were taken down with scissor. This allowed visualization of the pelvis. We able to identify 0 Prolenes on each side of the rectal stump. There multiple adhesions of the small bowel to the mesentery near the rectal stump. These were taken down bluntly and with scissor. Once the rectal stump was completely isolated attention was turned towards the colostomy site. From intra-abdominally lap scope we took down the thin adhesions to the peritoneum. Next pneumoperitoneum was evacuated and an elliptical incision was made around  the colostomy at the skin. Cautery was used to dissect down through the adhesions towards the fascia. Further dissection allowed complete separation of the colostomy from the fascia. A pursestring 0 Prolene was used and then the last 2 inches of colostomy were removed with cautery. And dilated the end: And it was able tolerate a 33 mm dilator. Next the 33 mm stapler was opened and the anvil was placed into the end of the colon pursestring was secured around the anvil, and the end: Able were placed back into the abdomen. Temporary closure of skin at this spot allowed 12 mm trocar passed through the wound and pneumoperitoneum to be maintained.  Once back laparoscopically, freed the end: From the left abdominal wall using Harmonic scalpel and scissors. This allowed the anvil to reach into the pelvis easily. At this time Dr. Gershon Crane we went below inserting the stapling device into the anus and up through the rectum and a end-to-end anastomosis was created. On leak test there multiple bubbles occurring from the left anterior aspect of the anastomosis. Decision was made open, therefore previous lower midline incision was opened with cautery, cautery is used to dissect down through subcutaneous anus tissues and the fascia was reopened in the midline. Multiple 3-0 silk's were used to Lembert the left lateral and anterior aspect of the anastomosis. Once again rigid proctoscopy was used for leak test this time showing no bubbles in holding good distention. Proctoscope was withdrawn. A 19 [place] Blake drain was placed through the right lower quadrant incision and placed into the pelvis behind the anastomosis. Drain was sutured in with 2-0 nylon at the skin. Exploration of the adhesed area of small bowel showed a 2cm serosal tear. This was  repaired with interrupted 3-0 silk Lembert sutures.  Colorectal protocol was then applied changed area was re-cleaned and redraped. Midline incision was then closed with 0 PDS in running  fashion. Previous colostomy site fascia was closed with 0 PDS in running fashion. The skin in all incisions was closed with staples except previous colostomy site which was packed with moistened gauze. A shield from anesthesia prepped PACU in stable condition.  Findings: positive leak test followed by negative leak test after repair.  Specimen: none  Implant: 19Fr blake drain   Blood loss: 13ml  Local anesthesia: 16ml [id number] marcaine  Complications: positive leak test requiring repair by conversion to laparotomy  Gurney Maxin, M.D. General, Bariatric, & Minimally Invasive Surgery [facility]

## 2016-07-28 NOTE — Anesthesia Procedure Notes (Signed)
Procedure Name: Intubation Date/Time: 07/28/2016 8:47 AM Performed by: Terrill Mohr Pre-anesthesia Checklist: Patient identified, Emergency Drugs available, Suction available and Patient being monitored Patient Re-evaluated:Patient Re-evaluated prior to inductionOxygen Delivery Method: Circle system utilized Preoxygenation: Pre-oxygenation with 100% oxygen Intubation Type: IV induction Ventilation: Mask ventilation without difficulty Laryngoscope Size: Mac and 3 Grade View: Grade I Tube type: Oral Tube size: 7.5 mm Number of attempts: 1 Airway Equipment and Method: Stylet Placement Confirmation: ETT inserted through vocal cords under direct vision,  positive ETCO2 and breath sounds checked- equal and bilateral Secured at: 21 (cm at teeth) cm Tube secured with: Tape Dental Injury: Teeth and Oropharynx as per pre-operative assessment

## 2016-07-28 NOTE — Anesthesia Preprocedure Evaluation (Addendum)
Anesthesia Evaluation  Patient identified by MRN, date of birth, ID band Patient awake    Reviewed: Allergy & Precautions, NPO status , Patient's Chart, lab work & pertinent test results, reviewed documented beta blocker date and time   Airway Mallampati: I  TM Distance: >3 FB Neck ROM: Full    Dental  (+) Teeth Intact, Dental Advisory Given   Pulmonary neg pulmonary ROS,    breath sounds clear to auscultation       Cardiovascular hypertension, Pt. on medications and Pt. on home beta blockers  Rhythm:Regular Rate:Normal  ECHO 2008 nl.   Neuro/Psych negative neurological ROS     GI/Hepatic negative GI ROS, Neg liver ROS,   Endo/Other  negative endocrine ROS  Renal/GU negative Renal ROS     Musculoskeletal  (+) Arthritis , Osteoarthritis,    Abdominal   Peds  Hematology   Anesthesia Other Findings Two incisors a bit long  Reproductive/Obstetrics                           Anesthesia Physical Anesthesia Plan  ASA: II  Anesthesia Plan: General   Post-op Pain Management:    Induction: Intravenous  Airway Management Planned: LMA  Additional Equipment:   Intra-op Plan:   Post-operative Plan: Extubation in OR  Informed Consent: I have reviewed the patients History and Physical, chart, labs and discussed the procedure including the risks, benefits and alternatives for the proposed anesthesia with the patient or authorized representative who has indicated his/her understanding and acceptance.   Dental advisory given  Plan Discussed with: CRNA  Anesthesia Plan Comments:         Anesthesia Quick Evaluation

## 2016-07-28 NOTE — Transfer of Care (Signed)
Immediate Anesthesia Transfer of Care Note  Patient: Sue [name]  Procedure(s) Performed: Procedure(s): LAPAROSCOPIC ASSISTED  COLOSTOMY TAKEDOWN WITH COLORECTAL ANASTOMOSIS (N/A)  Patient Location: PACU  Anesthesia Type:General  Level of Consciousness: awake, sedated and patient cooperative  Airway & Oxygen Therapy: Patient Spontanous Breathing and Patient connected to nasal cannula oxygen  Post-op Assessment: Report given to RN and Post -op Vital signs reviewed and stable  Post vital signs: Reviewed and stable  Last Vitals:  Vitals:   07/28/16 0724  BP: (!) 190/79  Pulse: 64  Resp: 18  Temp: 36.8 C    Last Pain:  Vitals:   07/28/16 0724  TempSrc: Oral         Complications: No apparent anesthesia complications

## 2016-07-28 NOTE — Anesthesia Postprocedure Evaluation (Signed)
Anesthesia Post Note  Patient: ADORA CANDANOZA  Procedure(s) Performed: Procedure(s) (LRB): LAPAROSCOPIC ASSISTED  COLOSTOMY TAKEDOWN WITH COLORECTAL ANASTOMOSIS (N/A)  Patient location during evaluation: PACU Anesthesia Type: General Level of consciousness: awake and sedated Pain management: satisfactory to patient Vital Signs Assessment: post-procedure vital signs reviewed and stable Respiratory status: spontaneous breathing, nonlabored ventilation, respiratory function stable and patient connected to nasal cannula oxygen Cardiovascular status: blood pressure returned to baseline and stable Postop Assessment: no signs of nausea or vomiting Anesthetic complications: no    Last Vitals:  Vitals:   07/28/16 1245 07/28/16 1300  BP: (!) 146/79 (!) 146/75  Pulse: 66 (!) 58  Resp: 18 14  Temp: 36.4 C     Last Pain:  Vitals:   07/28/16 1245  TempSrc:   PainSc: Asleep                 Denzil Mceachron,JAMES TERRILL

## 2016-07-28 NOTE — H&P (Signed)
Sue [name] is an 65 y.o. female.   Chief Complaint: colostomy HPI: 65 yo female with perforated diverticulitis underwent hartman's colectomy with end colostomy. She did well post op and has resolution of the infection. She presents for reversal of colostomy  Past Medical History:  Diagnosis Date  . FRACTURE, ARM, LEFT 11/15/2007  . FRACTURE, FOOT 11/15/2007  . FRACTURE, INDEX FINGER, LEFT 11/15/2007  . Heart murmur   . HYPERTENSION 11/15/2007  . INSOMNIA, CHRONIC 06/17/2009  . MITRAL VALVE PROLAPSE, HX OF 11/15/2007  . OSTEOARTHRITIS 11/15/2007  . RIB FRACTURE 11/15/2007  . Scarlet fever 11/15/2007  . Subdural hematoma ([place])   . TONSILLECTOMY, HX OF 11/15/2007    Past Surgical History:  Procedure Laterality Date  . COLONOSCOPY    . PARTIAL COLECTOMY N/A 02/11/2016   Procedure: PARTIAL COLECTOMY with colostomy;  Surgeon: Arta Bruce Mikela Senn, MD;  Location: [facility] OR;  Service: General;  Laterality: N/A;  . TONSILLECTOMY      Family History  Problem Relation Age of Onset  . Cancer Mother   . Hypertension Father   . Cancer Father   . Parkinsonism Other    Social History:  reports that she has never smoked. She has never used smokeless tobacco. She reports that she drinks alcohol. She reports that she does not use drugs.  Allergies:  Allergies  Allergen Reactions  . Clindamycin Anaphylaxis  . Latex     REACTION: sores in mouth  . Penicillins Hives    Has patient had a PCN reaction causing immediate rash, facial/tongue/throat swelling, SOB or lightheadedness with hypotension: Yes Has patient had a PCN reaction causing severe rash involving mucus membranes or skin necrosis: No Has patient had a PCN reaction that required hospitalization No Has patient had a PCN reaction occurring within the last 10 years: No If all of the above answers are "NO", then may proceed with Cephalosporin use.     Medications Prior to Admission  Medication Sig Dispense Refill  . CALCIUM-MAGNESIUM-VITAMIN D  PO Take 1 tablet by mouth 2 (two) times daily.    . diclofenac (VOLTAREN) 75 MG EC tablet Take 75 mg by mouth 2 (two) times daily as needed.  3  . Esterified Estrogens (MENEST) 0.3 MG tablet Take 0.15 mg by mouth every other day. take 1/2 tablet my mouth every other day     . ESTRACE VAGINAL 0.1 MG/GM vaginal cream Place 1 Applicatorful vaginally once a week.     Marland Kitchen GLUCOSAMINE HCL PO Take 1 tablet by mouth 2 (two) times daily.     . Melatonin 3 MG TABS Take 3 mg by mouth at bedtime.    . Multiple Vitamin (MULTIVITAMIN) capsule Take 1 capsule by mouth daily.      . Multiple Vitamins-Minerals (HAIR SKIN AND NAILS FORMULA PO) Take 1 tablet by mouth daily.    Ernestine Conrad 3-6-9 Fatty Acids (TRIPLE OMEGA COMPLEX PO) Take 1 capsule by mouth daily.    Marland Kitchen OVER THE COUNTER MEDICATION Take 1 tablet by mouth 2 (two) times daily.    . Potassium Gluconate 550 MG TABS Take 1 tablet by mouth daily.    Marland Kitchen XIIDRA 5 % SOLN Place 1 drop into both eyes 2 (two) times daily.  2  . ALPRAZolam (XANAX) 0.25 MG tablet TAKE 1 TABLET BY MOUTH TWICE A DAY AS NEEDED FOR ANXIETY 60 tablet 2  . Desoximetasone (TOPICORT) 0.25 % ointment Apply bid for aphtous ulcer 15 g 2  . metoprolol succinate (TOPROL-XL) 25 MG  24 hr tablet Take 1 tablet (25 mg total) by mouth daily. 90 tablet 3  . Phosphatidylserine-DHA-EPA (VAYACOG) 100-19.5-6.5 MG CAPS Take 1 capsule by mouth daily. 90 capsule 3  . polyethylene glycol powder (MIRALAX) powder Mix one cap full with at least 8 oz of liquid and drink once daily.    Marland Kitchen triamterene-hydrochlorothiazide (MAXZIDE-25) 37.5-25 MG tablet Take 1 tablet by mouth daily. 90 tablet 3  . zolpidem (AMBIEN) 5 MG tablet Take 1 tablet (5 mg total) by mouth at bedtime as needed. for sleep 90 tablet 0    No results found for this or any previous visit (from the past 48 hour(s)). No results found.  Review of Systems  Constitutional: Negative for chills and fever.  HENT: Negative for hearing loss.   Eyes: Negative for  blurred vision and double vision.  Respiratory: Negative for cough and hemoptysis.   Cardiovascular: Negative for chest pain and palpitations.  Gastrointestinal: Negative for abdominal pain, nausea and vomiting.  Genitourinary: Negative for dysuria and urgency.  Musculoskeletal: Negative for myalgias and neck pain.  Skin: Negative for itching and rash.  Neurological: Negative for dizziness, tingling and headaches.  Endo/Heme/Allergies: Does not bruise/bleed easily.  Psychiatric/Behavioral: Negative for depression and suicidal ideas.    Blood pressure (!) 190/79, pulse 64, temperature 98.2 F (36.8 C), temperature source Oral, resp. rate 18, weight 59.9 kg (132 lb), SpO2 100 %. Physical Exam  Vitals reviewed. Constitutional: She is oriented to person, place, and time. She appears well-developed and well-nourished.  HENT:  Head: Normocephalic and atraumatic.  Eyes: Conjunctivae and EOM are normal. Pupils are equal, round, and reactive to light.  Neck: Normal range of motion. Neck supple.  Cardiovascular: Normal rate and regular rhythm.   Respiratory: Effort normal and breath sounds normal.  GI: Soft. Bowel sounds are normal. She exhibits no distension. There is no tenderness.  Pink left sided ostomy  Musculoskeletal: Normal range of motion.  Neurological: She is alert and oriented to person, place, and time.  Skin: Skin is warm and dry.  Psychiatric: She has a normal mood and affect. Her behavior is normal.     Assessment/Plan 65 yo female with h/o diverticulitis with end colostomy -lap colostomy reversal with anastomosis -entereg -hospital admission  Mickeal Skinner, MD 07/28/2016, 7:40 AM

## 2016-07-29 ENCOUNTER — Encounter (HOSPITAL_COMMUNITY): Payer: Self-pay | Admitting: General Surgery

## 2016-07-29 LAB — CBC
HCT: 40.5 % (ref 36.0–46.0)
Hemoglobin: 13.4 g/dL (ref 12.0–15.0)
MCH: 29.4 pg (ref 26.0–34.0)
MCHC: 33.1 g/dL (ref 30.0–36.0)
MCV: 88.8 fL (ref 78.0–100.0)
PLATELETS: 295 10*3/uL (ref 150–400)
RBC: 4.56 MIL/uL (ref 3.87–5.11)
RDW: 15.1 % (ref 11.5–15.5)
WBC: 12.3 10*3/uL — AB (ref 4.0–10.5)

## 2016-07-29 LAB — COMPREHENSIVE METABOLIC PANEL
ALT: 17 U/L (ref 14–54)
AST: 23 U/L (ref 15–41)
Albumin: 3.3 g/dL — ABNORMAL LOW (ref 3.5–5.0)
Alkaline Phosphatase: 43 U/L (ref 38–126)
Anion gap: 8 (ref 5–15)
BILIRUBIN TOTAL: 0.6 mg/dL (ref 0.3–1.2)
BUN: 6 mg/dL (ref 6–20)
CO2: 24 mmol/L (ref 22–32)
CREATININE: 0.77 mg/dL (ref 0.44–1.00)
Calcium: 8.4 mg/dL — ABNORMAL LOW (ref 8.9–10.3)
Chloride: 104 mmol/L (ref 101–111)
Glucose, Bld: 148 mg/dL — ABNORMAL HIGH (ref 65–99)
POTASSIUM: 3.7 mmol/L (ref 3.5–5.1)
Sodium: 136 mmol/L (ref 135–145)
TOTAL PROTEIN: 5.7 g/dL — AB (ref 6.5–8.1)

## 2016-07-29 MED ORDER — METOPROLOL TARTRATE 12.5 MG HALF TABLET
12.5000 mg | ORAL_TABLET | Freq: Two times a day (BID) | ORAL | Status: DC
  Administered 2016-07-29 – 2016-08-02 (×9): 12.5 mg via ORAL
  Filled 2016-07-29 (×8): qty 1

## 2016-07-29 NOTE — Progress Notes (Signed)
  Progress Note: General Surgery Service   Subjective: Pain more than expected, moderate control with PCA. No nausea or vomiting, up to chair this monring  Objective: Vital signs in last 24 hours: Temp:  [97.5 F (36.4 C)-99.9 F (37.7 C)] 98.7 F (37.1 C) (11/16 1105) Pulse Rate:  [58-73] 62 (11/16 1105) Resp:  [10-20] 17 (11/16 1105) BP: (122-161)/(55-88) 155/67 (11/16 1105) SpO2:  [95 %-100 %] 99 % (11/16 1105) FiO2 (%):  [100 %] 100 % (11/16 0455) Last BM Date: 07/28/16 (before surgery)  Intake/Output from previous day: 11/15 0701 - 11/16 0700 In: 3581.3 [P.O.:240; I.V.:3341.3] Out: 1905 [id number]; Drains:90; Blood:75] Intake/Output this shift: Total I/O In: -  Out: 965 [Urine:950; Drains:15]  Lungs: CTAB  Cardiovascular: RRR  Abd: soft, ATTP, band in place over left incision, other incisions c/d/i  Extremities: no edema  Neuro: AOx4  Lab Results: CBC   Recent Labs  07/28/16 1547 07/29/16 0731  WBC 14.6* 12.3*  HGB 14.1 13.4  HCT 41.4 40.5  PLT 297 295   BMET  Recent Labs  07/28/16 1547 07/29/16 0731  NA  --  136  K  --  3.7  CL  --  104  CO2  --  24  GLUCOSE  --  148*  BUN  --  6  CREATININE 0.86 0.77  CALCIUM  --  8.4*   PT/INR No results for input(s): LABPROT, INR in the last 72 hours. ABG No results for input(s): PHART, HCO3 in the last 72 hours.  Invalid input(s): PCO2, PO2  Studies/Results:  Anti-infectives: Anti-infectives    Start     Dose/Rate Route Frequency Ordered Stop   07/28/16 0600  ciprofloxacin (CIPRO) IVPB 400 mg     400 mg 200 mL/hr over 60 Minutes Intravenous To Short Stay 07/27/16 1113 07/28/16 0920   07/28/16 0600  metroNIDAZOLE (FLAGYL) IVPB 500 mg     500 mg 100 mL/hr over 60 Minutes Intravenous To Short Stay 07/27/16 1113 07/28/16 0952      Medications: Scheduled Meds: . alvimopan  12 mg Oral BID  . enoxaparin (LOVENOX) injection  40 mg Subcutaneous Q24H  . metoprolol tartrate  12.5 mg Oral BID  .  morphine   Intravenous Q4H   Continuous Infusions: . dextrose 5 % and 0.45 % NaCl with KCl 20 mEq/L 75 mL/hr at 07/28/16 1557   PRN Meds:.acetaminophen **OR** acetaminophen, ALPRAZolam, diphenhydrAMINE **OR** diphenhydrAMINE, HYDROcodone-acetaminophen, HYDROmorphone (DILAUDID) injection, [COMPLETED] ketorolac **FOLLOWED BY** ketorolac, naloxone **AND** sodium chloride flush, ondansetron **OR** ondansetron (ZOFRAN) IV, simethicone, zolpidem  Assessment/Plan: Patient Active Problem List   Diagnosis Date Noted  . Diverticulitis large intestine 07/28/2016  . Perforated diverticulum of large intestine 02/11/2016  . Abdominal pain, epigastric 01/30/2016  . HTN (hypertension) 08/14/2015  . Subdural hemorrhage following injury, no loss of consciousness ([place]) 05/22/2015  . Osteoarthritis of left midfoot 07/25/2014  . Abnormality of gait 07/25/2014  . Well adult exam 07/17/2014  . INSOMNIA, CHRONIC 06/17/2009  . OSTEOARTHRITIS 11/15/2007  . MITRAL VALVE PROLAPSE, HX OF 11/15/2007   s/p Procedure(s): LAPAROSCOPIC ASSISTED  COLOSTOMY TAKEDOWN WITH COLORECTAL ANASTOMOSIS 07/28/2016 -continue pca -continue toradol -labs reviewed leukocytosis decreased -continue clears -continue ambulation    LOS: 1 day   Mickeal Skinner, MD Pg# [phone] [facility]

## 2016-07-30 LAB — CBC
HEMATOCRIT: 36.9 % (ref 36.0–46.0)
HEMOGLOBIN: 11.9 g/dL — AB (ref 12.0–15.0)
MCH: 29.1 pg (ref 26.0–34.0)
MCHC: 32.2 g/dL (ref 30.0–36.0)
MCV: 90.2 fL (ref 78.0–100.0)
Platelets: 278 10*3/uL (ref 150–400)
RBC: 4.09 MIL/uL (ref 3.87–5.11)
RDW: 15 % (ref 11.5–15.5)
WBC: 10 10*3/uL (ref 4.0–10.5)

## 2016-07-30 MED ORDER — ENSURE ENLIVE PO LIQD
237.0000 mL | Freq: Two times a day (BID) | ORAL | Status: DC
  Administered 2016-07-30: 237 mL via ORAL

## 2016-07-30 NOTE — Care Management Note (Addendum)
Case Management Note  Patient Details  Name: Sue [name] MRN: [id number] Date of Birth: 1950-10-22  Subjective/Objective:                    Action/Plan: Patient set up with [facility] for daily wet to dry dressing changes   Patient would like [facility] again for home health . She wants to private pay for nursing visits that are not covered by Medicare. Called [place] with Alvis Lemmings. Awaiting call back  Edwina returned call, asking if home health order can be daily . Spoke with Claiborne Billings at Dr Kinsinger's office.  Awaiting call back from Dr Kieth Brightly.  Called Dr Kieth Brightly again. Awaiting call back.  Called Dr Kieth Brightly times three . Home health orders not signed will delete .   MD please enter home health order with wound care directions and frequency, and face to face . Thanks Magdalen Spatz RN BSN [phone]  Expected Discharge Date:                  Expected Discharge Plan:  [place]  In-House Referral:     Discharge planning Services  CM Consult  Post Acute Care Choice:  Home Health Choice offered to:  Patient  DME Arranged:    DME Agency:     HH Arranged:  RN [place] Agency:  [place]  Status of Service:  Completed, signed off  If discussed at [place] of Stay Meetings, dates discussed:    Additional Comments:  Marilu Favre, RN 07/30/2016, 9:35 AM

## 2016-07-30 NOTE — Progress Notes (Signed)
  Progress Note: General Surgery Service   Subjective: Pain improved, +BM yesterday initially bloody now brown and diarrhea like. Some nausea. Ambulated in room yesterday  Objective: Vital signs in last 24 hours: Temp:  [98 F (36.7 C)-98.8 F (37.1 C)] 98.2 F (36.8 C) (11/17 0536) Pulse Rate:  [62-68] 64 (11/17 0536) Resp:  [17-22] 19 (11/17 0536) BP: (144-179)/(61-71) 162/61 (11/17 0536) SpO2:  [96 %-100 %] 99 % (11/17 0536) Last BM Date: 07/28/16  Intake/Output from previous day: 11/16 0701 - 11/17 0700 In: -  Out: 1295 [Urine:1250; Drains:45] Intake/Output this shift: No intake/output data recorded.  Lungs: CTAB  Cardiovascular: RRR  Abd: soft, ATTP, no erythema or drainage, drain with serosang drainage  Extremities: no edema  Neuro: AOx4  Lab Results: CBC   Recent Labs  07/29/16 0731 07/30/16 0540  WBC 12.3* 10.0  HGB 13.4 11.9*  HCT 40.5 36.9  PLT 295 278   BMET  Recent Labs  07/28/16 1547 07/29/16 0731  NA  --  136  K  --  3.7  CL  --  104  CO2  --  24  GLUCOSE  --  148*  BUN  --  6  CREATININE 0.86 0.77  CALCIUM  --  8.4*   PT/INR No results for input(s): LABPROT, INR in the last 72 hours. ABG No results for input(s): PHART, HCO3 in the last 72 hours.  Invalid input(s): PCO2, PO2  Studies/Results:  Anti-infectives: Anti-infectives    Start     Dose/Rate Route Frequency Ordered Stop   07/28/16 0600  ciprofloxacin (CIPRO) IVPB 400 mg     400 mg 200 mL/hr over 60 Minutes Intravenous To Short Stay 07/27/16 1113 07/28/16 0920   07/28/16 0600  metroNIDAZOLE (FLAGYL) IVPB 500 mg     500 mg 100 mL/hr over 60 Minutes Intravenous To Short Stay 07/27/16 1113 07/28/16 0952      Medications: Scheduled Meds: . alvimopan  12 mg Oral BID  . enoxaparin (LOVENOX) injection  40 mg Subcutaneous Q24H  . feeding supplement (ENSURE ENLIVE)  237 mL Oral BID BM  . metoprolol tartrate  12.5 mg Oral BID   Continuous Infusions: PRN  Meds:.acetaminophen **OR** acetaminophen, ALPRAZolam, diphenhydrAMINE **OR** diphenhydrAMINE, HYDROcodone-acetaminophen, HYDROmorphone (DILAUDID) injection, [COMPLETED] ketorolac **FOLLOWED BY** ketorolac, ondansetron **OR** ondansetron (ZOFRAN) IV, simethicone, zolpidem  Assessment/Plan: Patient Active Problem List   Diagnosis Date Noted  . Diverticulitis large intestine 07/28/2016  . Perforated diverticulum of large intestine 02/11/2016  . Abdominal pain, epigastric 01/30/2016  . HTN (hypertension) 08/14/2015  . Subdural hemorrhage following injury, no loss of consciousness ([place]) 05/22/2015  . Osteoarthritis of left midfoot 07/25/2014  . Abnormality of gait 07/25/2014  . Well adult exam 07/17/2014  . INSOMNIA, CHRONIC 06/17/2009  . OSTEOARTHRITIS 11/15/2007  . MITRAL VALVE PROLAPSE, HX OF 11/15/2007   s/p Procedure(s): LAPAROSCOPIC ASSISTED  COLOSTOMY TAKEDOWN WITH COLORECTAL ANASTOMOSIS 07/28/2016 Leukocytosis resolved, +return bowel function -dc pca -continue soft diet -ambulate -saline lock IV -case management for home health options for wound care...   LOS: 2 days   Mickeal Skinner, MD Pg# [phone] [facility]

## 2016-07-31 NOTE — Progress Notes (Signed)
3 Days Post-Op  Subjective: Doing well, just had her first BM. Very loose, and thing are hyperactive right now.  Sites both look good.  Objective: Vital signs in last 24 hours: Temp:  [97.3 F (36.3 C)-99.1 F (37.3 C)] 99.1 F (37.3 C) (11/18 0613) Pulse Rate:  [68-79] 70 (11/18 0613) Resp:  [18-19] 18 (11/18 0613) BP: (121-168)/(56-70) 142/70 (11/18 0613) SpO2:  [97 %-100 %] 97 % (11/18 0613) Last BM Date: 07/29/16 240 PO Urine 500 Stools x 1 Intake/Output from previous day: 11/17 0701 - 11/18 0700 In: 1060 [P.O.:1060] Out: 2090 [Urine:2050; Drains:40] Intake/Output this shift: Total I/O In: 240 [P.O.:240] Out: 500 [Urine:500]  General appearance: alert, cooperative and no distress Resp: clear to auscultation bilaterally GI: soft, sore, + BS drain serous  Lab Results:   Recent Labs  07/29/16 0731 07/30/16 0540  WBC 12.3* 10.0  HGB 13.4 11.9*  HCT 40.5 36.9  PLT 295 278    BMET  Recent Labs  07/28/16 1547 07/29/16 0731  NA  --  136  K  --  3.7  CL  --  104  CO2  --  24  GLUCOSE  --  148*  BUN  --  6  CREATININE 0.86 0.77  CALCIUM  --  8.4*   PT/INR No results for input(s): LABPROT, INR in the last 72 hours.   Recent Labs Lab 07/29/16 0731  AST 23  ALT 17  ALKPHOS 43  BILITOT 0.6  PROT 5.7*  ALBUMIN 3.3*     Lipase     Component Value Date/Time   LIPASE 28 02/11/2016 1639     Studies/Results: No results found.   Prior to Admission medications   Medication Sig Start Date End Date Taking? Authorizing Provider  ALPRAZolam (XANAX) 0.25 MG tablet TAKE 1 TABLET BY MOUTH TWICE A DAY AS NEEDED FOR ANXIETY 07/20/16  Yes Cassandria Anger, MD  CALCIUM-MAGNESIUM-VITAMIN D PO Take 1 tablet by mouth 2 (two) times daily.   Yes Historical Provider, MD  diclofenac (VOLTAREN) 75 MG EC tablet Take 75 mg by mouth 2 (two) times daily as needed. 05/08/16  Yes Historical Provider, MD  Esterified Estrogens (MENEST) 0.3 MG tablet Take 0.15 mg by mouth  every other day. take 1/2 tablet my mouth every other day    Yes Historical Provider, MD  ESTRACE VAGINAL 0.1 MG/GM vaginal cream Place 1 Applicatorful vaginally once a week.  05/20/15  Yes Historical Provider, MD  GLUCOSAMINE HCL PO Take 1 tablet by mouth 2 (two) times daily.    Yes Historical Provider, MD  Melatonin 3 MG TABS Take 3 mg by mouth at bedtime.   Yes Historical Provider, MD  metoprolol succinate (TOPROL-XL) 25 MG 24 hr tablet Take 1 tablet (25 mg total) by mouth daily. 07/20/16  Yes Cassandria Anger, MD  Multiple Vitamin (MULTIVITAMIN) capsule Take 1 capsule by mouth daily.     Yes Historical Provider, MD  Multiple Vitamins-Minerals (HAIR SKIN AND NAILS FORMULA PO) Take 1 tablet by mouth daily.   Yes Historical Provider, MD  Omega 3-6-9 Fatty Acids (TRIPLE OMEGA COMPLEX PO) Take 1 capsule by mouth daily.   Yes Historical Provider, MD  OVER THE COUNTER MEDICATION Take 1 tablet by mouth 2 (two) times daily.   Yes Historical Provider, MD  Phosphatidylserine-DHA-EPA (VAYACOG) 100-19.5-6.5 MG CAPS Take 1 capsule by mouth daily. 07/20/16  Yes Evie Lacks Plotnikov, MD  Potassium Gluconate 550 MG TABS Take 1 tablet by mouth daily.   Yes Historical  Provider, MD  triamterene-hydrochlorothiazide (MAXZIDE-25) 37.5-25 MG tablet Take 1 tablet by mouth daily. 07/20/16  Yes Evie Lacks Plotnikov, MD  XIIDRA 5 % SOLN Place 1 drop into both eyes 2 (two) times daily. 06/28/16  Yes Historical Provider, MD  zolpidem (AMBIEN) 5 MG tablet Take 1 tablet (5 mg total) by mouth at bedtime as needed. for sleep 07/20/16  Yes Cassandria Anger, MD  Desoximetasone (TOPICORT) 0.25 % ointment Apply bid for aphtous ulcer 07/20/11   Neena Rhymes, MD  polyethylene glycol powder [facility]) powder Mix one cap full with at least 8 oz of liquid and drink once daily. 02/18/16   Jill Alexanders, PA-C    Medications: . enoxaparin (LOVENOX) injection  40 mg Subcutaneous Q24H  . feeding supplement (ENSURE ENLIVE)  237 mL Oral  BID BM  . metoprolol tartrate  12.5 mg Oral BID     Assessment/Plan Hx of diverticulitis S/p laparoscopic converted to open take down of end colostomy with colo-rectostomy, 07/28/16, Dr. Kieth Brightly Hypertension Osteoarthritis FEN: soft diet DVT:  lovenox ID: Pre op only  Plan:  Continue rx, she plans on d/c Monday       LOS: 3 days    Dimas Scheck 07/31/2016 [phone]

## 2016-08-01 NOTE — Progress Notes (Signed)
4 Days Post-Op  Subjective: Making progress.  Tolerating diet better.  Had a loose bowel movement.  Pain seems reasonably well controlled. No fever. She is fearful of going home today since visiting nurses cannot see her today. She does not think she can change the bandage to the colostomy site. We will plan discharge tomorrow  Objective: Vital signs in last 24 hours: Temp:  [97.6 F (36.4 C)-99.1 F (37.3 C)] 97.6 F (36.4 C) (11/19 0503) Pulse Rate:  [61-67] 61 (11/19 0503) Resp:  [18] 18 (11/19 0503) BP: (147-166)/(59-67) 164/67 (11/19 0503) SpO2:  [96 %-100 %] 96 % (11/19 0503) Last BM Date: 07/31/16  Intake/Output from previous day: 11/18 0701 - 11/19 0700 In: 74 [P.O.:960] Out: 2350 [Urine:2300; Drains:50] Intake/Output this shift: Total I/O In: 120 [P.O.:120] Out: -   General appearance: Alert.  Pleasant.  Minimal distress. Resp: clear to auscultation bilaterally GI: Soft.  No significant distention.  Midline wound looks clean under honeycomb bandage.  Drainage is thin serosanguineous.  Old colostomy site clean.  Lab Results:  No results found for this or any previous visit (from the past 24 hour(s)).   Studies/Results: No results found.  . enoxaparin (LOVENOX) injection  40 mg Subcutaneous Q24H  . feeding supplement (ENSURE ENLIVE)  237 mL Oral BID BM  . metoprolol tartrate  12.5 mg Oral BID     Assessment/Plan: s/p Procedure(s): LAPAROSCOPIC ASSISTED  COLOSTOMY TAKEDOWN WITH COLORECTAL ANASTOMOSIS  Hx of diverticulitis S/p laparoscopic converted to open take down of end colostomy with colo-rectostomy, 07/28/16, Dr. Kieth Brightly Hypertension Osteoarthritis FEN: soft diet DVT:  lovenox ID: Pre op only  Plan:   DC drain.  Change bandages.  Shower.  DC IV.            Home tomorrow            HHN apparently already arranged.   @PROBHOSP @  LOS: 4 days    Latrease Kunde M 08/01/2016  . .prob

## 2016-08-02 MED ORDER — DICLOFENAC SODIUM 75 MG PO TBEC: DELAYED_RELEASE_TABLET | ORAL | 3 refills | Status: DC

## 2016-08-02 MED ORDER — ACETAMINOPHEN 325 MG PO TABS: 650.0000 mg | ORAL_TABLET | Freq: Four times a day (QID) | ORAL | Status: DC | PRN

## 2016-08-02 MED ORDER — POLYETHYLENE GLYCOL 3350 17 GM/SCOOP PO POWD: ORAL | 0 refills | Status: DC

## 2016-08-02 MED ORDER — HYDROCODONE-ACETAMINOPHEN 5-325 MG PO TABS: 1.0000 | ORAL_TABLET | ORAL | 0 refills | Status: DC | PRN

## 2016-08-02 NOTE — Progress Notes (Signed)
Patient discharged to home with instructions. 

## 2016-08-02 NOTE — Progress Notes (Signed)
5 Days Post-Op  Subjective: Drain out, sites all look good.   Objective: Vital signs in last 24 hours: Temp:  [98.8 F (37.1 C)-99.1 F (37.3 C)] 98.8 F (37.1 C) (11/20 0415) Pulse Rate:  [67-74] 69 (11/20 0415) Resp:  [20] 20 (11/20 0415) BP: (166-174)/(72-78) 174/78 (11/19 2019) SpO2:  [97 %-100 %] 100 % (11/20 0415) Last BM Date: 08/01/16 120 PO Urine 200 recorded BM x 1 No labs Intake/Output from previous day: 11/19 0701 - 11/20 0700 In: 120 [P.O.:120] Out: 200 [Urine:200] Intake/Output this shift: No intake/output data recorded.  Resp: clear to auscultation bilaterally GI: soft, sore, sites look fine, ostomy site looks good  Lab Results:  No results for input(s): WBC, HGB, HCT, PLT in the last 72 hours.  BMET No results for input(s): NA, K, CL, CO2, GLUCOSE, BUN, CREATININE, CALCIUM in the last 72 hours. PT/INR No results for input(s): LABPROT, INR in the last 72 hours.   Recent Labs Lab 07/29/16 0731  AST 23  ALT 17  ALKPHOS 43  BILITOT 0.6  PROT 5.7*  ALBUMIN 3.3*     Lipase     Component Value Date/Time   LIPASE 28 02/11/2016 1639     Studies/Results: No results found.  Medications: . enoxaparin (LOVENOX) injection  40 mg Subcutaneous Q24H  . feeding supplement (ENSURE ENLIVE)  237 mL Oral BID BM  . metoprolol tartrate  12.5 mg Oral BID    Assessment/Plan Hx of diverticulitis S/p laparoscopic converted to open take down of end colostomy with colo-rectostomy, 07/28/16, Dr. Kieth Brightly POD5 Hypertension Osteoarthritis FEN: soft diet DVT:  lovenox ID: Pre op only  Plan:  Home today, ostomy with wet to dry dressings BID, staples out next week and follow up with Dr. Kieth Brightly in 2 weeks      LOS: 5 days    Denisia Harpole 08/02/2016 [phone]

## 2016-08-02 NOTE — Care Management Important Message (Signed)
Important Message  Patient Details  Name: Sue [name] MRN: :[id number] Date of Birth: 1951-06-24   Medicare Important Message Given:  Yes    [name] 08/02/2016, 11:44 AM

## 2016-08-02 NOTE — Discharge Instructions (Signed)
Mechanical Wound Debridement You can shower with the wound open and then repack after your shower Introduction Mechanical wound debridement is a treatment to remove dead tissue from a wound. This helps the wound heal. The treatment involves cleaning the wound (irrigation) and using a pad or gauze (dressing) to remove dead tissue and debris from the wound. There are different types of mechanical wound debridement. Depending on the wound, you may need to repeat this procedure or change to another form of debridement as your wound starts to heal. Tell a health care provider about:  Any allergies you have.  All medicines you are taking, including vitamins, herbs, eye drops, creams, and over-the-counter medicines.  Any blood disorders you have.  Any medical conditions you have, including any conditions that:  Cause a significant decrease in blood circulation to the part of the body where the wound is, such as peripheral vascular disease.  Compromise your defense (immune) system or white blood count.  Any surgeries you have had.  Whether you are pregnant or may be pregnant. What are the risks? Generally, this is a safe procedure. However, problems may occur, including:  Infection.  Bleeding.  Damage to healthy tissue in and around your wound.  Soreness or pain.  Failure of the wound to heal.  Scarring. What happens before the procedure? You may be given antibiotic medicine to help prevent infection. What happens during the procedure?  Your health care provider may apply a numbing medicine (topical anesthetic) to the wound.  Your health care provider will irrigate your wound with a germ-free (sterile), salt-water (saline) solution. This removes debris, bacteria, and dead tissue.  Depending on what type of mechanical wound debridement you are having, your health care provider may do one of the following:  Put a dressing on your wound. You may have dry gauze pad placed into the  wound. Your health care provider will remove the gauze after the wound is dry. Any dead tissue and debris that has dried into the gauze will be lifted out of the wound (wet-to-dry debridement).  Use a type of pad (monofilament fiber debridement pad). This pad has a fluffy surface on one side that picks up dead tissue and debris from your wound. Your health care provider wets the pad and wipes it over your wound for several minutes.  Irrigate your wound with a pressurized stream of solution such as saline or water.  Once your health care provider is finished, he or she may apply a light dressing to your wound. The procedure may vary among health care providers and hospitals. What happens after the procedure?  You may receive medicine for pain.  You will continue to receive antibiotic medicine if it was started before your procedure. This information is not intended to replace advice given to you by your health care provider. Make sure you discuss any questions you have with your health care provider. Document Released: 05/21/2015 Document Revised: 02/05/2016 Document Reviewed: 01/08/2015  2017 Elsevier  [facility], [place] [phone]  OPEN ABDOMINAL SURGERY: POST OP INSTRUCTIONS  Always review your discharge instruction sheet given to you by the facility where your surgery was performed.  IF YOU HAVE DISABILITY OR FAMILY LEAVE FORMS, YOU MUST BRING THEM TO THE OFFICE FOR PROCESSING.  PLEASE DO NOT GIVE THEM TO YOUR DOCTOR.  1. A prescription for pain medication may be given to you upon discharge.  Take your pain medication as prescribed, if needed.  If narcotic  pain medicine is not needed, then you may take acetaminophen (Tylenol) or ibuprofen (Advil) as needed. 2. Take your usually prescribed medications unless otherwise directed. 3. If you need a refill on your pain medication, please contact your pharmacy. They will contact our office to request authorization.   Prescriptions will not be filled after 5pm or on week-ends. 4. You should follow a light diet the first few days after arrival home, such as soup and crackers, pudding, etc.unless your doctor has advised otherwise. A high-fiber, low fat diet can be resumed as tolerated.   Be sure to include lots of fluids daily. Most patients will experience some swelling and bruising on the chest and neck area.  Ice packs will help.  Swelling and bruising can take several days to resolve 5. Most patients will experience some swelling and bruising in the area of the incision. Ice pack will help. Swelling and bruising can take several days to resolve..  6. It is common to experience some constipation if taking pain medication after surgery.  Increasing fluid intake and taking a stool softener will usually help or prevent this problem from occurring.  A mild laxative (Milk of Magnesia or Miralax) should be taken according to package directions if there are no bowel movements after 48 hours. 7.  You may have steri-strips (small skin tapes) in place directly over the incision.  These strips should be left on the skin for 7-10 days.  If your surgeon used skin glue on the incision, you may shower in 24 hours.  The glue will flake off over the next 2-3 weeks.  Any sutures or staples will be removed at the office during your follow-up visit. You may find that a light gauze bandage over your incision may keep your staples from being rubbed or pulled. You may shower and replace the bandage daily. 8. ACTIVITIES:  You may resume regular (light) daily activities beginning the next day--such as daily self-care, walking, climbing stairs--gradually increasing activities as tolerated.  You may have sexual intercourse when it is comfortable.  Refrain from any heavy lifting or straining until approved by your doctor. a. You may drive when you no longer are taking prescription pain medication, you can comfortably wear a seatbelt, and you can  safely maneuver your car and apply brakes b. Return to Work: ___________________________________ 8. You should see your doctor in the office for a follow-up appointment approximately two weeks after your surgery.  Make sure that you call for this appointment within a day or two after you arrive home to insure a convenient appointment time. OTHER INSTRUCTIONS:  _____________________________________________________________ _____________________________________________________________  WHEN TO CALL YOUR DOCTOR: 1. Fever over 101.0 2. Inability to urinate 3. Nausea and/or vomiting 4. Extreme swelling or bruising 5. Continued bleeding from incision. 6. Increased pain, redness, or drainage from the incision. 7. Difficulty swallowing or breathing 8. Muscle cramping or spasms. 9. Numbness or tingling in hands or feet or around lips.  The clinic staff is available to answer your questions during regular business hours.  Please dont hesitate to call and ask to speak to one of the nurses if you have concerns.  For further questions, please visit [email or web address]

## 2016-08-03 DIAGNOSIS — I10 Essential (primary) hypertension: Secondary | ICD-10-CM | POA: Diagnosis not present

## 2016-08-03 DIAGNOSIS — Z48815 Encounter for surgical aftercare following surgery on the digestive system: Secondary | ICD-10-CM | POA: Diagnosis not present

## 2016-08-04 DIAGNOSIS — Z48815 Encounter for surgical aftercare following surgery on the digestive system: Secondary | ICD-10-CM | POA: Diagnosis not present

## 2016-08-04 DIAGNOSIS — I10 Essential (primary) hypertension: Secondary | ICD-10-CM | POA: Diagnosis not present

## 2016-08-06 DIAGNOSIS — I10 Essential (primary) hypertension: Secondary | ICD-10-CM | POA: Diagnosis not present

## 2016-08-06 DIAGNOSIS — Z48815 Encounter for surgical aftercare following surgery on the digestive system: Secondary | ICD-10-CM | POA: Diagnosis not present

## 2016-08-08 ENCOUNTER — Other Ambulatory Visit: Payer: Self-pay | Admitting: Internal Medicine

## 2016-08-09 DIAGNOSIS — Z48815 Encounter for surgical aftercare following surgery on the digestive system: Secondary | ICD-10-CM | POA: Diagnosis not present

## 2016-08-09 DIAGNOSIS — I10 Essential (primary) hypertension: Secondary | ICD-10-CM | POA: Diagnosis not present

## 2016-08-11 DIAGNOSIS — Z48815 Encounter for surgical aftercare following surgery on the digestive system: Secondary | ICD-10-CM | POA: Diagnosis not present

## 2016-08-11 DIAGNOSIS — I10 Essential (primary) hypertension: Secondary | ICD-10-CM | POA: Diagnosis not present

## 2016-08-16 DIAGNOSIS — Z48815 Encounter for surgical aftercare following surgery on the digestive system: Secondary | ICD-10-CM | POA: Diagnosis not present

## 2016-08-16 DIAGNOSIS — I10 Essential (primary) hypertension: Secondary | ICD-10-CM | POA: Diagnosis not present

## 2016-08-17 NOTE — Discharge Summary (Signed)
Physician Discharge Summary  Patient ID: Sue [name] MRN: [id number] DOB/AGE: July 19, 1951 65 y.o.  Admit date: 07/28/2016 Discharge date: 08/02/2016  Admission Diagnoses:  Hx of diverticulitis with partial colectomy and colostomy 02/11/16 Dr. Gurney Maxin Hypertension Osteoarthritis  Discharge Diagnoses:  Same  Active Problems:   Diverticulitis large intestine   PROCEDURES: S/p laparoscopic converted to open take down of end colostomy with colo-rectostomy, 07/28/16, Dr. Carlus Pavlov Course:  65 yo female with perforated diverticulitis underwent hartman's colectomy with end colostomy. She did well post op and has resolution of the infection. She presents for reversal of colostomy She was taken to the OR on admit.  She tolerated the procedure well. Her diet was slowly advanced as bowel function returned.   She was ready for discharge by 08/02/16, all drains were out and follow up arranged.  Condition on d/c:  Improved  CBC Latest Ref Rng & Units 07/30/2016 07/29/2016 07/28/2016  WBC 4.0 - 10.5 K/uL 10.0 12.3(H) 14.6(H)  Hemoglobin 12.0 - 15.0 g/dL 11.9(L) 13.4 14.1  Hematocrit 36.0 - 46.0 % 36.9 40.5 41.4  Platelets 150 - 400 K/uL 278 295 297   CMP Latest Ref Rng & Units 07/29/2016 07/28/2016 07/22/2016  Glucose 65 - 99 mg/dL 148(H) - 97  BUN 6 - 20 mg/dL 6 - 14  Creatinine 0.44 - 1.00 mg/dL 0.77 0.86 0.75  Sodium 135 - 145 mmol/L 136 - 139  Potassium 3.5 - 5.1 mmol/L 3.7 - 4.1  Chloride 101 - 111 mmol/L 104 - 101  CO2 22 - 32 mmol/L 24 - 27  Calcium 8.9 - 10.3 mg/dL 8.4(L) - 10.2  Total Protein 6.5 - 8.1 g/dL 5.7(L) - -  Total Bilirubin 0.3 - 1.2 mg/dL 0.6 - -  Alkaline Phos 38 - 126 U/L 43 - -  AST 15 - 41 U/L 23 - -  ALT 14 - 54 U/L 17 - -    Disposition: 01-Home or Self Care     Medication List    STOP taking these medications   CALCIUM-MAGNESIUM-VITAMIN D PO   GLUCOSAMINE HCL PO   HAIR SKIN AND NAILS FORMULA PO   Melatonin 3 MG Tabs    Phosphatidylserine-DHA-EPA 100-19.5-6.5 MG Caps Commonly known as:  VAYACOG   TRIPLE OMEGA COMPLEX PO     TAKE these medications   acetaminophen 325 MG tablet Commonly known as:  TYLENOL Take 2 tablets (650 mg total) by mouth every 6 (six) hours as needed for mild pain (or temp > 100).   Desoximetasone 0.25 % ointment Commonly known as:  TOPICORT Apply bid for aphtous ulcer   diclofenac 75 MG EC tablet Commonly known as:  VOLTAREN If you use this do not take Ibuprofen or naprosyn, all are NSAID'S, and can be hard on your stomach if you use more than one. What changed:  how much to take  how to take this  when to take this  reasons to take this  additional instructions   ESTRACE VAGINAL 0.1 MG/GM vaginal cream Generic drug:  estradiol Place 1 Applicatorful vaginally once a week.   HYDROcodone-acetaminophen 5-325 MG tablet Commonly known as:  NORCO/VICODIN Take 1-2 tablets by mouth every 4 (four) hours as needed for moderate pain.   MENEST 0.3 MG tablet Generic drug:  Esterified Estrogens Take 0.15 mg by mouth every other day. take 1/2 tablet my mouth every other day   metoprolol succinate 25 MG 24 hr tablet Commonly known as:  TOPROL-XL Take 1 tablet (25 mg total) by  mouth daily.   multivitamin capsule Take 1 capsule by mouth daily.   OVER THE COUNTER MEDICATION Take 1 tablet by mouth 2 (two) times daily.   polyethylene glycol powder powder Commonly known as:  MIRALAX Mix one cap full with at least 8 oz of liquid and drink once daily. You can use this if needed.  If your having Bowel movements without it you do not need it. What changed:  additional instructions   Potassium Gluconate 550 MG Tabs Take 1 tablet by mouth daily.   triamterene-hydrochlorothiazide 37.5-25 MG tablet Commonly known as:  MAXZIDE-25 Take 1 tablet by mouth daily.   XIIDRA 5 % Soln Generic drug:  Lifitegrast Place 1 drop into both eyes 2 (two) times daily.      Follow-up  Information    Mickeal Skinner, MD Follow up on 08/19/2016.   Specialty:  General Surgery Why:  Your appointment is at 3:15 PM, be at the office 30 minutes early for check in. Contact information: [address] [place] [address] [phone]        [facility] Follow up on 08/10/2016.   Specialty:  General Surgery Why:  Staple removal at 2:30 PM by the nursing staff, be there for check in 30 minutes early. Contact information: [address] [place] [address] [phone]           Signed: Earnstine Regal 08/17/2016, 10:08 AM

## 2016-08-27 DIAGNOSIS — L821 Other seborrheic keratosis: Secondary | ICD-10-CM | POA: Diagnosis not present

## 2016-08-27 DIAGNOSIS — D1801 Hemangioma of skin and subcutaneous tissue: Secondary | ICD-10-CM | POA: Diagnosis not present

## 2016-09-14 DIAGNOSIS — Z01419 Encounter for gynecological examination (general) (routine) without abnormal findings: Secondary | ICD-10-CM | POA: Diagnosis not present

## 2016-09-14 DIAGNOSIS — Z124 Encounter for screening for malignant neoplasm of cervix: Secondary | ICD-10-CM | POA: Diagnosis not present

## 2016-09-21 ENCOUNTER — Other Ambulatory Visit: Payer: Self-pay

## 2016-09-21 ENCOUNTER — Other Ambulatory Visit: Payer: Self-pay | Admitting: Internal Medicine

## 2016-09-21 DIAGNOSIS — E2839 Other primary ovarian failure: Secondary | ICD-10-CM

## 2016-09-22 DIAGNOSIS — Z961 Presence of intraocular lens: Secondary | ICD-10-CM | POA: Diagnosis not present

## 2016-09-22 DIAGNOSIS — H04123 Dry eye syndrome of bilateral lacrimal glands: Secondary | ICD-10-CM | POA: Diagnosis not present

## 2016-09-22 DIAGNOSIS — H11442 Conjunctival cysts, left eye: Secondary | ICD-10-CM | POA: Diagnosis not present

## 2016-09-22 DIAGNOSIS — H524 Presbyopia: Secondary | ICD-10-CM | POA: Diagnosis not present

## 2016-09-22 DIAGNOSIS — H2511 Age-related nuclear cataract, right eye: Secondary | ICD-10-CM | POA: Diagnosis not present

## 2016-09-23 ENCOUNTER — Other Ambulatory Visit: Payer: Self-pay

## 2016-09-23 ENCOUNTER — Other Ambulatory Visit: Payer: Self-pay | Admitting: Internal Medicine

## 2016-09-23 DIAGNOSIS — E2839 Other primary ovarian failure: Secondary | ICD-10-CM

## 2016-11-01 DIAGNOSIS — H2511 Age-related nuclear cataract, right eye: Secondary | ICD-10-CM | POA: Diagnosis not present

## 2016-11-01 DIAGNOSIS — H25811 Combined forms of age-related cataract, right eye: Secondary | ICD-10-CM | POA: Diagnosis not present

## 2016-11-03 ENCOUNTER — Ambulatory Visit
Admission: RE | Admit: 2016-11-03 | Discharge: 2016-11-03 | Disposition: A | Discharge status: Home / Self Care | Payer: Medicare Other | Source: Ambulatory Visit | Attending: Internal Medicine | Admitting: Internal Medicine

## 2016-11-03 DIAGNOSIS — Z13820 Encounter for screening for osteoporosis: Secondary | ICD-10-CM | POA: Diagnosis not present

## 2016-11-03 DIAGNOSIS — Z780 Asymptomatic menopausal state: Secondary | ICD-10-CM | POA: Diagnosis not present

## 2016-11-03 DIAGNOSIS — E2839 Other primary ovarian failure: Secondary | ICD-10-CM

## 2016-11-04 ENCOUNTER — Ambulatory Visit: Payer: Medicare Other | Admitting: Podiatry

## 2016-11-05 ENCOUNTER — Ambulatory Visit (INDEPENDENT_AMBULATORY_CARE_PROVIDER_SITE_OTHER): Payer: Medicare Other | Admitting: Podiatry

## 2016-11-05 DIAGNOSIS — L84 Corns and callosities: Secondary | ICD-10-CM

## 2016-11-06 NOTE — Progress Notes (Signed)
Subjective:     Patient ID: Sue [name], female   DOB: [birth year], 66 y.o.   MRN: [id number]  HPI patient presents with lesions on both feet the become painful and make it hard to walk   Review of Systems     Objective:   Physical Exam Neurovascular status intact with keratotic lesion present    Assessment:     Chronic lesion formation bilateral with porokeratotic type core    Plan:     Debridement of lesions with no indications of pathology with no iatrogenic bleeding and reappoint for routine visit

## 2016-12-03 ENCOUNTER — Other Ambulatory Visit (INDEPENDENT_AMBULATORY_CARE_PROVIDER_SITE_OTHER): Payer: Medicare Other

## 2016-12-03 DIAGNOSIS — E785 Hyperlipidemia, unspecified: Secondary | ICD-10-CM | POA: Diagnosis not present

## 2016-12-03 DIAGNOSIS — R202 Paresthesia of skin: Secondary | ICD-10-CM | POA: Diagnosis not present

## 2016-12-03 DIAGNOSIS — E559 Vitamin D deficiency, unspecified: Secondary | ICD-10-CM | POA: Diagnosis not present

## 2016-12-03 DIAGNOSIS — Z0000 Encounter for general adult medical examination without abnormal findings: Secondary | ICD-10-CM

## 2016-12-03 DIAGNOSIS — D508 Other iron deficiency anemias: Secondary | ICD-10-CM

## 2016-12-03 LAB — CBC WITH DIFFERENTIAL/PLATELET
BASOS PCT: 0.6 % (ref 0.0–3.0)
Basophils Absolute: 0 10*3/uL (ref 0.0–0.1)
EOS PCT: 3.8 % (ref 0.0–5.0)
Eosinophils Absolute: 0.2 10*3/uL (ref 0.0–0.7)
HEMATOCRIT: 42.3 % (ref 36.0–46.0)
Hemoglobin: 14.2 g/dL (ref 12.0–15.0)
LYMPHS ABS: 2.5 10*3/uL (ref 0.7–4.0)
LYMPHS PCT: 39.1 % (ref 12.0–46.0)
MCHC: 33.5 g/dL (ref 30.0–36.0)
MCV: 90.4 fl (ref 78.0–100.0)
MONOS PCT: 10.8 % (ref 3.0–12.0)
Monocytes Absolute: 0.7 10*3/uL (ref 0.1–1.0)
NEUTROS ABS: 2.9 10*3/uL (ref 1.4–7.7)
NEUTROS PCT: 45.7 % (ref 43.0–77.0)
PLATELETS: 286 10*3/uL (ref 150.0–400.0)
RBC: 4.68 Mil/uL (ref 3.87–5.11)
RDW: 13.9 % (ref 11.5–15.5)
WBC: 6.4 10*3/uL (ref 4.0–10.5)

## 2016-12-03 LAB — VITAMIN D 25 HYDROXY (VIT D DEFICIENCY, FRACTURES): VITD: 38.24 ng/mL (ref 30.00–100.00)

## 2016-12-03 LAB — URINALYSIS
Bilirubin Urine: NEGATIVE
HGB URINE DIPSTICK: NEGATIVE
Ketones, ur: NEGATIVE
LEUKOCYTES UA: NEGATIVE
Nitrite: NEGATIVE
Specific Gravity, Urine: >=1.03 — AB (ref 1.000–1.030)
URINE GLUCOSE: NEGATIVE
UROBILINOGEN UA: 0.2 (ref 0.0–1.0)
pH: 5.5 (ref 5.0–8.0)

## 2016-12-03 LAB — HEPATIC FUNCTION PANEL
ALBUMIN: 4.4 g/dL (ref 3.5–5.2)
ALK PHOS: 48 U/L (ref 39–117)
ALT: 17 U/L (ref 0–35)
AST: 17 U/L (ref 0–37)
Bilirubin, Direct: 0 mg/dL (ref 0.0–0.3)
TOTAL PROTEIN: 6.9 g/dL (ref 6.0–8.3)
Total Bilirubin: 0.4 mg/dL (ref 0.2–1.2)

## 2016-12-03 LAB — HEPATITIS C ANTIBODY: HCV AB: NEGATIVE

## 2016-12-03 LAB — BASIC METABOLIC PANEL
BUN: 20 mg/dL (ref 6–23)
CHLORIDE: 102 meq/L (ref 96–112)
CO2: 30 mEq/L (ref 19–32)
Calcium: 9.6 mg/dL (ref 8.4–10.5)
Creatinine, Ser: 0.81 mg/dL (ref 0.40–1.20)
GFR: 74.75 mL/min (ref >60.00)
GLUCOSE: 100 mg/dL — AB (ref 70–99)
Potassium: 3.4 mEq/L — ABNORMAL LOW (ref 3.5–5.1)
SODIUM: 140 meq/L (ref 135–145)

## 2016-12-03 LAB — LIPID PANEL
CHOL/HDL RATIO: 4
CHOLESTEROL: 223 mg/dL — AB (ref 0–200)
HDL: 60 mg/dL (ref >39.00)
LDL Cholesterol: 145 mg/dL — ABNORMAL HIGH (ref 0–99)
NonHDL: 162.83
TRIGLYCERIDES: 87 mg/dL (ref 0.0–149.0)
VLDL: 17.4 mg/dL (ref 0.0–40.0)

## 2016-12-03 LAB — VITAMIN B12: VITAMIN B 12: 1179 pg/mL — AB (ref 211–911)

## 2016-12-03 LAB — TSH: TSH: 3.11 u[IU]/mL (ref 0.35–4.50)

## 2016-12-07 ENCOUNTER — Ambulatory Visit (INDEPENDENT_AMBULATORY_CARE_PROVIDER_SITE_OTHER): Payer: Medicare Other | Admitting: Internal Medicine

## 2016-12-07 ENCOUNTER — Encounter: Payer: Self-pay | Admitting: Internal Medicine

## 2016-12-07 VITALS — BP 132/76 | HR 61 | Temp 98.5°F | Resp 16 | Ht 64.0 in | Wt 132.5 lb

## 2016-12-07 DIAGNOSIS — E876 Hypokalemia: Secondary | ICD-10-CM

## 2016-12-07 MED ORDER — POTASSIUM CHLORIDE ER 8 MEQ PO TBCR: 8.0000 meq | EXTENDED_RELEASE_TABLET | Freq: Every day | ORAL | 3 refills | Status: DC

## 2016-12-07 NOTE — Progress Notes (Signed)
Pre-visit discussion using our clinic review tool. No additional management support is needed unless otherwise documented below in the visit note.  

## 2016-12-07 NOTE — Progress Notes (Signed)
Subjective:  Patient ID: Sue [name] [name], female    DOB: Feb 20, 1951  Age: 66 y.o. MRN: [id number]  CC: Follow-up (colon resection surgery back 07/2016); Hypertension; and Osteoarthritis   HPI Sue [name] [name] presents for diverticulitis - stoma reversal in 11/17 C/o mild constipation x 2-3 d  Outpatient Medications Prior to Visit  Medication Sig Dispense Refill  . acetaminophen (TYLENOL) 325 MG tablet Take 2 tablets (650 mg total) by mouth every 6 (six) hours as needed for mild pain (or temp > 100).    . ALPRAZolam (XANAX) 0.25 MG tablet TAKE 1 TABLET BY MOUTH TWICE A DAY AS NEEDED FOR ANXIETY 60 tablet 2  . Desoximetasone (TOPICORT) 0.25 % ointment Apply bid for aphtous ulcer 15 g 2  . diclofenac (VOLTAREN) 75 MG EC tablet If you use this do not take Ibuprofen or naprosyn, all are NSAID'S, and can be hard on your stomach if you use more than one.  3  . Esterified Estrogens (MENEST) 0.3 MG tablet Take 0.15 mg by mouth every other day. take 1/2 tablet my mouth every other day     . ESTRACE VAGINAL 0.1 MG/GM vaginal cream Place 1 Applicatorful vaginally once a week.     Marland Kitchen HYDROcodone-acetaminophen (NORCO/VICODIN) 5-325 MG tablet Take 1-2 tablets by mouth every 4 (four) hours as needed for moderate pain. 30 tablet 0  . metoprolol succinate (TOPROL-XL) 25 MG 24 hr tablet Take 1 tablet (25 mg total) by mouth daily. 90 tablet 3  . Multiple Vitamin (MULTIVITAMIN) capsule Take 1 capsule by mouth daily.      Marland Kitchen OVER THE COUNTER MEDICATION Take 1 tablet by mouth 2 (two) times daily.    . polyethylene glycol powder (MIRALAX) powder Mix one cap full with at least 8 oz of liquid and drink once daily. You can use this if needed.  If your having Bowel movements without it you do not need it. 255 g 0  . Potassium Gluconate 550 MG TABS Take 1 tablet by mouth daily.    Marland Kitchen triamterene-hydrochlorothiazide (MAXZIDE-25) 37.5-25 MG tablet Take 1 tablet by mouth daily. 90 tablet 3  . XIIDRA 5 % SOLN Place 1  drop into both eyes 2 (two) times daily.  2  . zolpidem (AMBIEN) 5 MG tablet TAKE 1 TABLET AT BEDTIME AS NEEDED SLEEP 90 tablet 1   No facility-administered medications prior to visit.     ROS Review of Systems  Constitutional: Negative for activity change, appetite change, chills, fatigue and unexpected weight change.  HENT: Negative for congestion, mouth sores and sinus pressure.   Eyes: Negative for visual disturbance.  Respiratory: Negative for cough and chest tightness.   Gastrointestinal: Negative for abdominal pain and nausea.  Genitourinary: Negative for difficulty urinating, frequency and vaginal pain.  Musculoskeletal: Negative for back pain and gait problem.  Skin: Negative for pallor and rash.  Neurological: Negative for dizziness, tremors, weakness, numbness and headaches.  Psychiatric/Behavioral: Negative for confusion and sleep disturbance.    Objective:  BP 132/76   Pulse 61   Temp 98.5 F (36.9 C) (Oral)   Resp 16   Ht 5\' 4"  (1.626 m)   Wt 132 lb 8 oz (60.1 kg)   SpO2 99%   BMI 22.74 kg/m   BP Readings from Last 3 Encounters:  12/07/16 132/76  08/01/16 (!) 174/78  07/22/16 (!) 168/88    Wt Readings from Last 3 Encounters:  12/07/16 132 lb 8 oz (60.1 kg)  07/28/16 132 lb (59.9 kg)  07/22/16 132 lb 1.6 oz (59.9 kg)    Physical Exam  Constitutional: She appears well-developed. No distress.  HENT:  Head: Normocephalic.  Right Ear: External ear normal.  Left Ear: External ear normal.  Nose: Nose normal.  Mouth/Throat: Oropharynx is clear and moist.  Eyes: Conjunctivae are normal. Pupils are equal, round, and reactive to light. Right eye exhibits no discharge. Left eye exhibits no discharge.  Neck: Normal range of motion. Neck supple. No JVD present. No tracheal deviation present. No thyromegaly present.  Cardiovascular: Normal rate, regular rhythm and normal heart sounds.   Pulmonary/Chest: No stridor. No respiratory distress. She has no wheezes.    Abdominal: Soft. Bowel sounds are normal. She exhibits no distension and no mass. There is no tenderness. There is no rebound and no guarding.  Musculoskeletal: She exhibits no edema or tenderness.  Lymphadenopathy:    She has no cervical adenopathy.  Neurological: She displays normal reflexes. No cranial nerve deficit. She exhibits normal muscle tone. Coordination normal.  Skin: No rash noted. No erythema.  Psychiatric: She has a normal mood and affect. Her behavior is normal. Judgment and thought content normal.    Lab Results  Component Value Date   WBC 6.4 12/03/2016   HGB 14.2 12/03/2016   HCT 42.3 12/03/2016   PLT 286.0 12/03/2016   GLUCOSE 100 (H) 12/03/2016   CHOL 223 (H) 12/03/2016   TRIG 87.0 12/03/2016   HDL 60.00 12/03/2016   LDLDIRECT 118.2 07/19/2013   LDLCALC 145 (H) 12/03/2016   ALT 17 12/03/2016   AST 17 12/03/2016   NA 140 12/03/2016   K 3.4 (L) 12/03/2016   CL 102 12/03/2016   CREATININE 0.81 12/03/2016   BUN 20 12/03/2016   CO2 30 12/03/2016   TSH 3.11 12/03/2016    Dg Bone Density  Result Date: 11/03/2016 EXAM: DUAL X-RAY ABSORPTIOMETRY (DXA) FOR BONE MINERAL DENSITY IMPRESSION: Referring Physician:  Cassandria Anger PATIENT: Name: Sue [name], [name] Patient ID: [id number] Birth Date: 23-Mar-1951 Height: 64.3 in. Sex: Female Measured: 11/03/2016 Weight: 134.4 lbs. Indications: Estrogen Deficient, Postmenopausal Fractures: None Treatments: Calcium (E943.0), Estrace tablet, Vitamin D (E933.5) ASSESSMENT: The BMD measured at Femur Neck Right is 1.052 g/cm2 with a T-score of 0.1. This patient is considered normal according to [facility]) criteria. L-3 and L-4 were excluded due to degenerative changes. Per the official positions of the ISCD, it is not possible to quantitatively compare BMD or calculate an [facility] between exams done at different facilities. Site Region Measured Date Measured Age YA BMD Significant CHANGE T-score DualFemur Neck  Right 11/03/2016    [age]         0.1     1.052 g/cm2 AP Spine  L1-L2      11/03/2016    [age]         2.3     1.453 g/cm2 World Health Organization [facility]) criteria for post-menopausal, Caucasian Women: Normal       T-score at or above -1 SD Osteopenia   T-score between -1 and -2.5 SD Osteoporosis T-score at or below -2.5 SD RECOMMENDATION: [facility] recommends that FDA-approved medical therapies be considered in postmenopausal women and men age 87 or older with a: 1. Hip or vertebral (clinical or morphometric) fracture. 2. T-score of <-2.5 at the spine or hip. 3. Ten-year fracture probability by FRAX of 3% or greater for hip fracture or 20% or greater for major osteoporotic fracture. All treatment decisions require clinical judgment and consideration of individual  patient factors, including patient preferences, co-morbidities, previous drug use, risk factors not captured in the FRAX model (e.g. falls, vitamin D deficiency, increased bone turnover, interval significant decline in bone density) and possible under - or over-estimation of fracture risk by FRAX. All patients should ensure an adequate intake of dietary calcium (1200 mg/d) and vitamin D (800 IU daily) unless contraindicated. FOLLOW-UP: People with diagnosed cases of osteoporosis or at high risk for fracture should have regular bone mineral density tests. For patients eligible for Medicare, routine testing is allowed once every 2 years. The testing frequency can be increased to one year for patients who have rapidly progressing disease, those who are receiving or discontinuing medical therapy to restore bone mass, or have additional risk factors. I have reviewed this report, and agree with the above findings. [facility] Electronically Signed   By: Lahoma Crocker M.D.   On: 11/03/2016 14:33    Assessment & Plan:   There are no diagnoses linked to this encounter. I am having Ms. Defrain maintain her Esterified Estrogens,  multivitamin, Desoximetasone, ESTRACE VAGINAL, XIIDRA, OVER THE COUNTER MEDICATION, Potassium Gluconate, metoprolol succinate, triamterene-hydrochlorothiazide, acetaminophen, diclofenac, HYDROcodone-acetaminophen, polyethylene glycol powder, ALPRAZolam, and zolpidem.  No orders of the defined types were placed in this encounter.    Follow-up: No Follow-up on file.  Walker Kehr, MD

## 2016-12-31 ENCOUNTER — Other Ambulatory Visit: Payer: Self-pay | Admitting: Internal Medicine

## 2017-02-01 ENCOUNTER — Other Ambulatory Visit: Payer: Self-pay | Admitting: Internal Medicine

## 2017-02-01 DIAGNOSIS — D1801 Hemangioma of skin and subcutaneous tissue: Secondary | ICD-10-CM | POA: Diagnosis not present

## 2017-02-01 DIAGNOSIS — L821 Other seborrheic keratosis: Secondary | ICD-10-CM | POA: Diagnosis not present

## 2017-02-01 DIAGNOSIS — L812 Freckles: Secondary | ICD-10-CM | POA: Diagnosis not present

## 2017-02-01 DIAGNOSIS — L578 Other skin changes due to chronic exposure to nonionizing radiation: Secondary | ICD-10-CM | POA: Diagnosis not present

## 2017-02-01 DIAGNOSIS — L570 Actinic keratosis: Secondary | ICD-10-CM | POA: Diagnosis not present

## 2017-02-01 DIAGNOSIS — D171 Benign lipomatous neoplasm of skin and subcutaneous tissue of trunk: Secondary | ICD-10-CM | POA: Diagnosis not present

## 2017-02-01 DIAGNOSIS — L2089 Other atopic dermatitis: Secondary | ICD-10-CM | POA: Diagnosis not present

## 2017-02-02 NOTE — Telephone Encounter (Signed)
Called refill into [facility] had to leave on pharmacy vm../lmb 

## 2017-02-16 ENCOUNTER — Ambulatory Visit (INDEPENDENT_AMBULATORY_CARE_PROVIDER_SITE_OTHER): Payer: Medicare Other | Admitting: Podiatry

## 2017-02-16 ENCOUNTER — Encounter: Payer: Self-pay | Admitting: Podiatry

## 2017-02-16 DIAGNOSIS — L600 Ingrowing nail: Secondary | ICD-10-CM | POA: Diagnosis not present

## 2017-02-18 NOTE — Progress Notes (Signed)
Subjective:    Patient ID: Sue [name], female   DOB: 66 y.o.   MRN: [id number]   HPI patient presents stating that she wanted to have the nail checked again as it's been red    ROS      Objective:  Physical Exam neurovascular status intact negative Homans sign noted with nail that is doing well with mild redness localized with no proximal edema erythema     Assessment:    Mild ingrown toenail deformity     Plan:    Discussed further removal of nail but at this time were just can watch begin soaks and I trimmed out some crusted tissue

## 2017-03-10 DIAGNOSIS — H53141 Visual discomfort, right eye: Secondary | ICD-10-CM | POA: Diagnosis not present

## 2017-03-10 DIAGNOSIS — H5310 Unspecified subjective visual disturbances: Secondary | ICD-10-CM | POA: Diagnosis not present

## 2017-03-10 DIAGNOSIS — H52222 Regular astigmatism, left eye: Secondary | ICD-10-CM | POA: Diagnosis not present

## 2017-03-10 DIAGNOSIS — H5202 Hypermetropia, left eye: Secondary | ICD-10-CM | POA: Diagnosis not present

## 2017-03-10 DIAGNOSIS — H5211 Myopia, right eye: Secondary | ICD-10-CM | POA: Diagnosis not present

## 2017-03-10 DIAGNOSIS — Z9841 Cataract extraction status, right eye: Secondary | ICD-10-CM | POA: Diagnosis not present

## 2017-03-10 DIAGNOSIS — H26491 Other secondary cataract, right eye: Secondary | ICD-10-CM | POA: Diagnosis not present

## 2017-03-10 DIAGNOSIS — Z9842 Cataract extraction status, left eye: Secondary | ICD-10-CM | POA: Diagnosis not present

## 2017-03-10 DIAGNOSIS — Z961 Presence of intraocular lens: Secondary | ICD-10-CM | POA: Diagnosis not present

## 2017-03-31 DIAGNOSIS — H26491 Other secondary cataract, right eye: Secondary | ICD-10-CM | POA: Diagnosis not present

## 2017-04-11 ENCOUNTER — Ambulatory Visit: Payer: Medicare Other | Admitting: Internal Medicine

## 2017-04-27 DIAGNOSIS — S80211A Abrasion, right knee, initial encounter: Secondary | ICD-10-CM | POA: Diagnosis not present

## 2017-04-27 DIAGNOSIS — S0990XA Unspecified injury of head, initial encounter: Secondary | ICD-10-CM | POA: Diagnosis not present

## 2017-04-27 DIAGNOSIS — S8001XA Contusion of right knee, initial encounter: Secondary | ICD-10-CM | POA: Diagnosis not present

## 2017-04-27 DIAGNOSIS — I10 Essential (primary) hypertension: Secondary | ICD-10-CM | POA: Diagnosis not present

## 2017-04-27 DIAGNOSIS — S0093XA Contusion of unspecified part of head, initial encounter: Secondary | ICD-10-CM | POA: Diagnosis not present

## 2017-04-27 DIAGNOSIS — W19XXXA Unspecified fall, initial encounter: Secondary | ICD-10-CM | POA: Diagnosis not present

## 2017-04-27 DIAGNOSIS — G319 Degenerative disease of nervous system, unspecified: Secondary | ICD-10-CM | POA: Diagnosis not present

## 2017-04-27 DIAGNOSIS — M25561 Pain in right knee: Secondary | ICD-10-CM | POA: Diagnosis not present

## 2017-04-27 DIAGNOSIS — Z79899 Other long term (current) drug therapy: Secondary | ICD-10-CM | POA: Diagnosis not present

## 2017-04-29 ENCOUNTER — Encounter: Payer: Self-pay | Admitting: Podiatry

## 2017-04-29 ENCOUNTER — Ambulatory Visit (INDEPENDENT_AMBULATORY_CARE_PROVIDER_SITE_OTHER): Payer: Medicare Other | Admitting: Podiatry

## 2017-04-29 VITALS — BP 139/72 | HR 63 | Resp 16

## 2017-04-29 DIAGNOSIS — L84 Corns and callosities: Secondary | ICD-10-CM | POA: Diagnosis not present

## 2017-04-29 DIAGNOSIS — R600 Localized edema: Secondary | ICD-10-CM | POA: Diagnosis not present

## 2017-05-05 DIAGNOSIS — M25561 Pain in right knee: Secondary | ICD-10-CM | POA: Diagnosis not present

## 2017-05-18 DIAGNOSIS — M25561 Pain in right knee: Secondary | ICD-10-CM | POA: Diagnosis not present

## 2017-05-19 ENCOUNTER — Other Ambulatory Visit (INDEPENDENT_AMBULATORY_CARE_PROVIDER_SITE_OTHER): Payer: Medicare Other

## 2017-05-19 DIAGNOSIS — E876 Hypokalemia: Secondary | ICD-10-CM | POA: Diagnosis not present

## 2017-05-19 LAB — BASIC METABOLIC PANEL
BUN: 13 mg/dL (ref 6–23)
CALCIUM: 9.6 mg/dL (ref 8.4–10.5)
CO2: 29 meq/L (ref 19–32)
CREATININE: 0.79 mg/dL (ref 0.40–1.20)
Chloride: 102 mEq/L (ref 96–112)
GFR: 76.83 mL/min (ref >60.00)
Glucose, Bld: 94 mg/dL (ref 70–99)
Potassium: 4 mEq/L (ref 3.5–5.1)
Sodium: 140 mEq/L (ref 135–145)

## 2017-05-22 DIAGNOSIS — M79651 Pain in right thigh: Secondary | ICD-10-CM | POA: Diagnosis not present

## 2017-05-22 DIAGNOSIS — M25561 Pain in right knee: Secondary | ICD-10-CM | POA: Diagnosis not present

## 2017-05-26 DIAGNOSIS — M25561 Pain in right knee: Secondary | ICD-10-CM | POA: Diagnosis not present

## 2017-05-27 ENCOUNTER — Ambulatory Visit: Payer: Medicare Other | Admitting: Internal Medicine

## 2017-05-31 DIAGNOSIS — M25561 Pain in right knee: Secondary | ICD-10-CM | POA: Diagnosis not present

## 2017-05-31 IMAGING — CT CT HEAD W/O CM
2 series · 16 of 30 positions shown, 18 images · non-contrast
Comparison: Rajib Hossain [HOSPITAL] noncontrast head CT
05/08/2015, available on [HOSPITAL] PACS

CLINICAL DATA: 64-year-old female with subdural hematoma diagnosed
in [REDACTED] after fall from horse. Subsequent encounter.

EXAM:
CT HEAD WITHOUT CONTRAST
TECHNIQUE: Contiguous axial images were obtained from the base of the skull
through the vertex without intravenous contrast.

[Series 3: head bone · axial · 0.49mm/px · z∈[+12,+123]mm · 8 of 56 slices shown]
[im 6/56  bone]
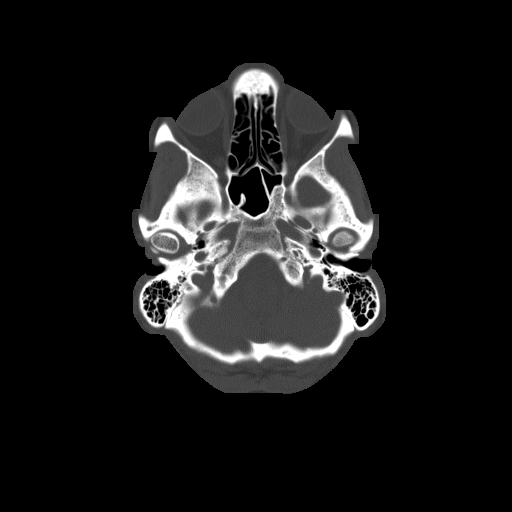
[im 12/56  bone]
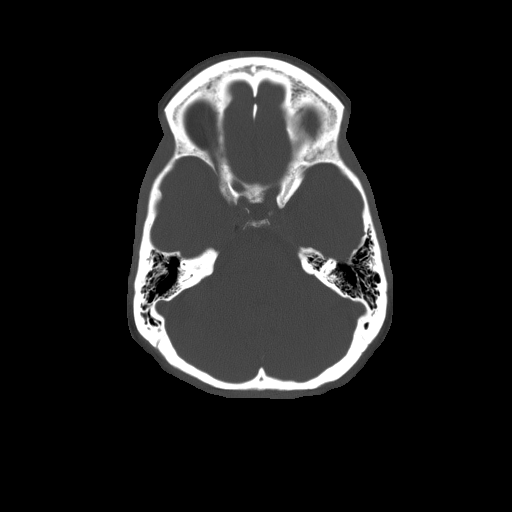
[im 18/56  bone]
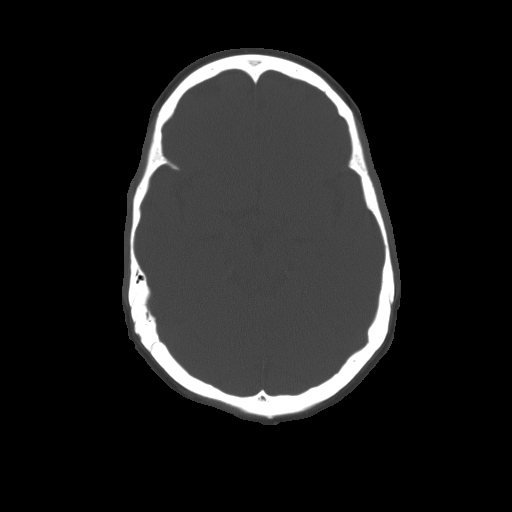
[im 24/56  bone]
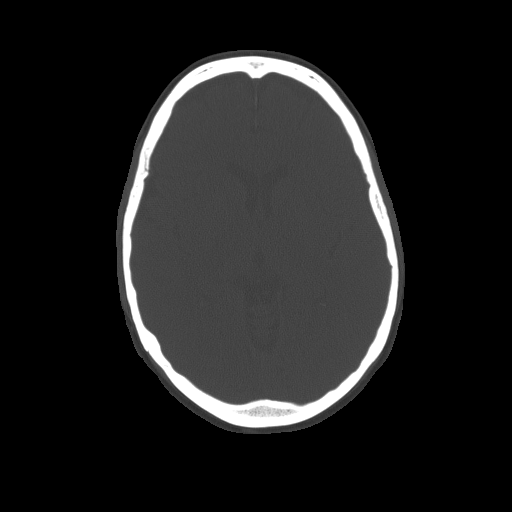
[im 32/56  bone]
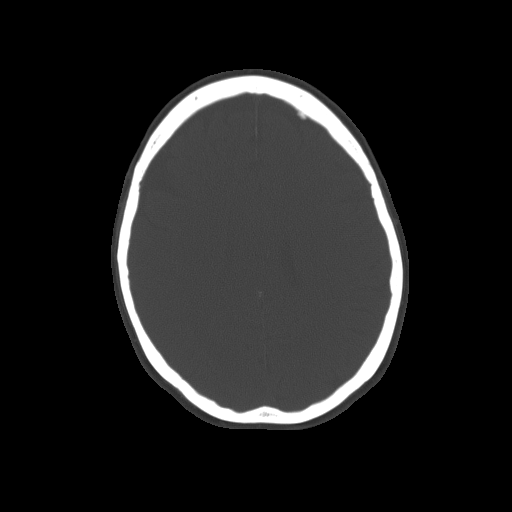
[im 38/56  bone]
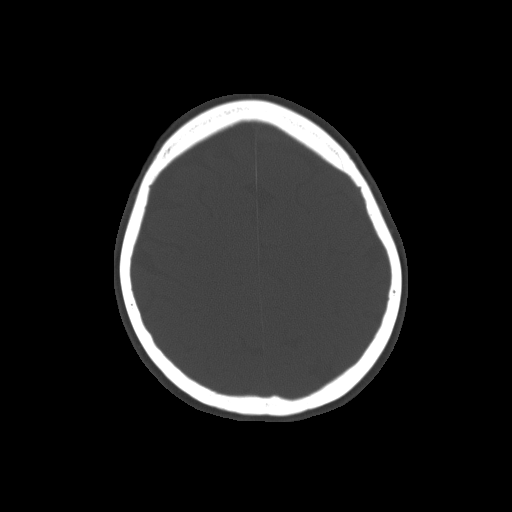
[im 44/56  bone]
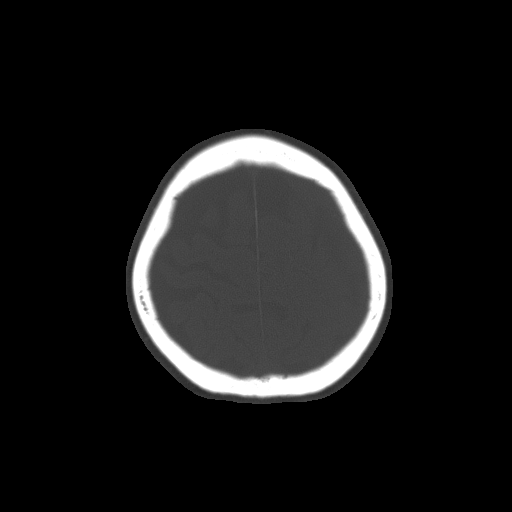
[im 50/56  bone]
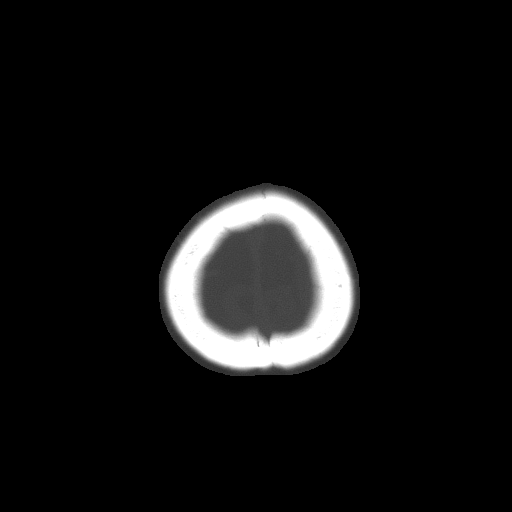

[Series 32: 3d filtered head w/o · axial · non-contrast · 0.49mm/px · z∈[+15,+122]mm · 8 of 28 slices shown, 10 images]
[im 4/28  brain]
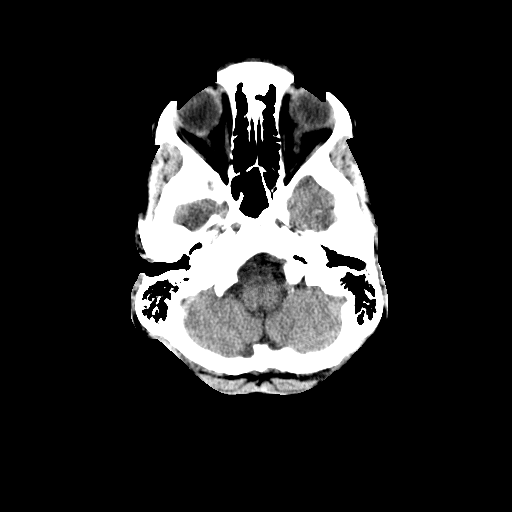
[im 4/28  bone]
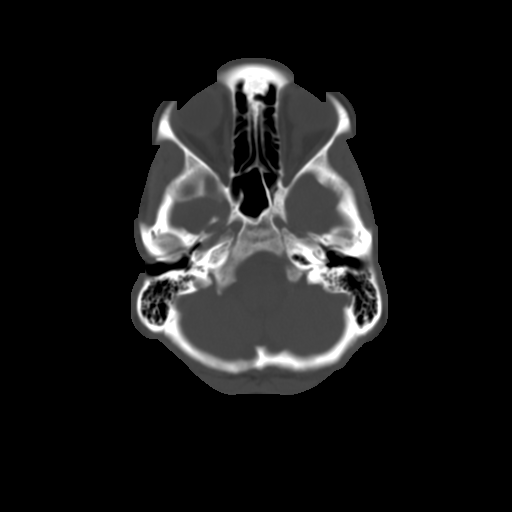
[im 7/28  brain]
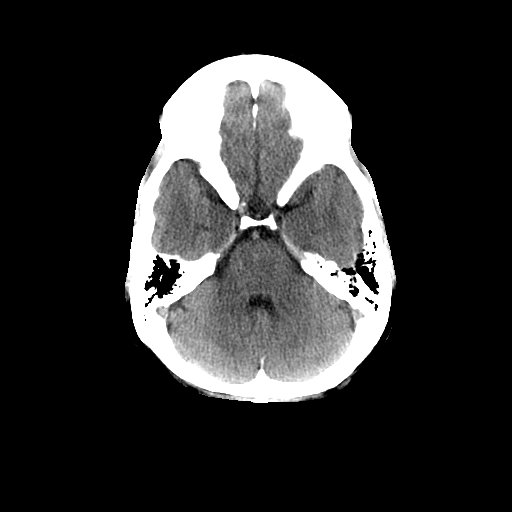
[im 10/28  brain]
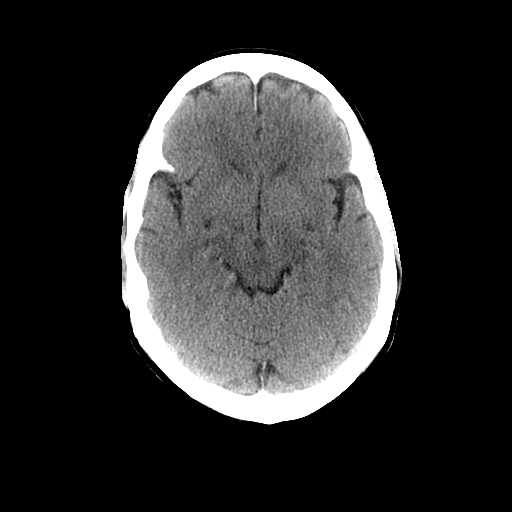
[im 13/28  brain]
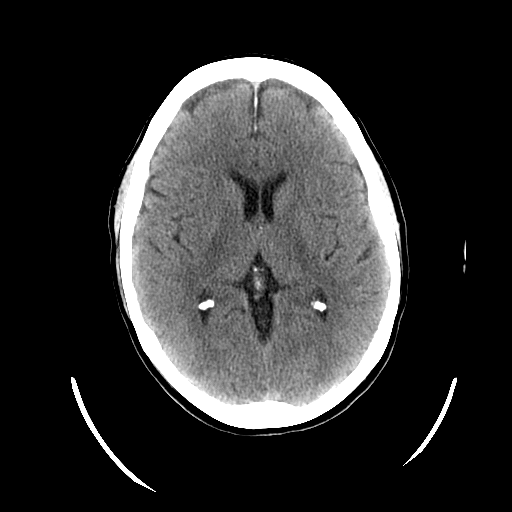
[im 16/28  brain]
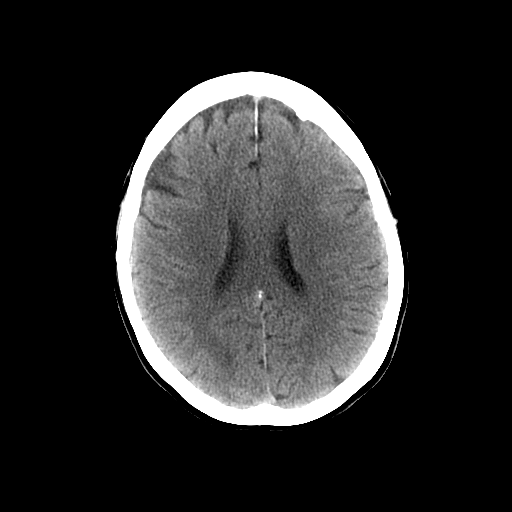
[im 16/28  bone]
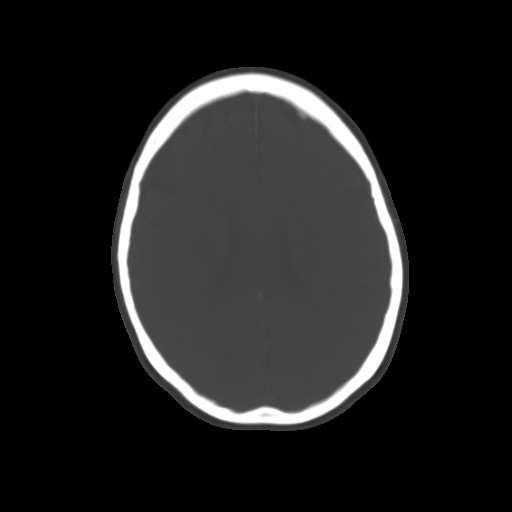
[im 19/28  brain]
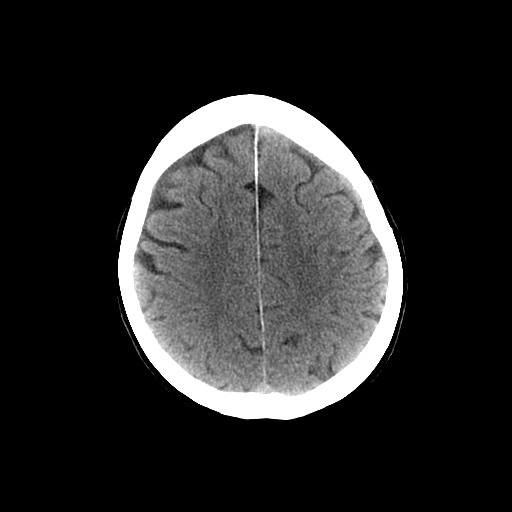
[im 22/28  brain]
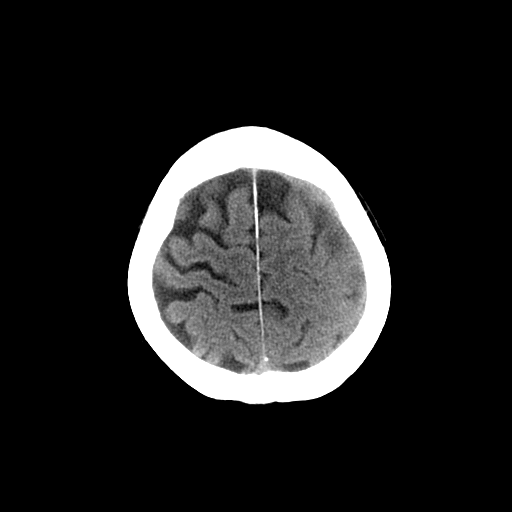
[im 25/28  brain]
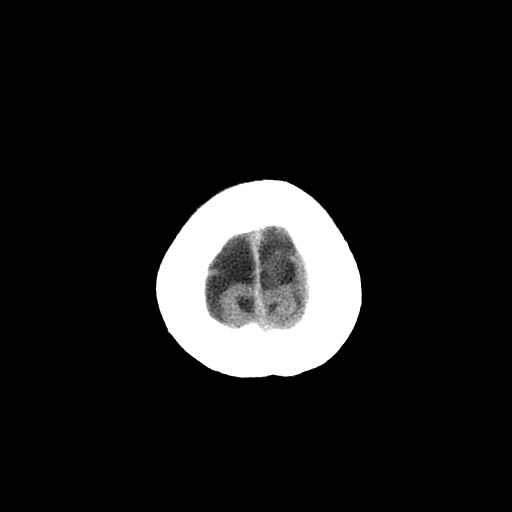

[16 of 30 positions shown; findings below may reference images not displayed]

FINDINGS: Visualized paranasal sinuses and mastoids are clear. Calvarium
appears stable and intact. No acute osseous abnormality identified.
No acute orbit or scalp soft tissue findings.

Residual mostly isodense left side subdural hematoma most apparent
over the left anterior convexity and mildly lobulated. The volume of
hemorrhage does appear mildly decreased since 05/08/2015. Focal
thickness up to 8 mm now all, versus 9-10 mm previously. No
significant mass effect on the left hemisphere or left lateral
ventricle. No midline shift. Middle an anterior cranial fossa appear
spared. Basilar cisterns remain normal. No ventriculomegaly. Stable
gray-white matter differentiation throughout the brain. No
cortically based acute infarct identified. No new intracranial
hemorrhage identified. Calcified atherosclerosis at the skull base.
No suspicious intracranial vascular hyperdensity.
IMPRESSION: 1. Mild regression of left vertex mostly isodense subdural hematoma
since 05/08/2015. Residual without significant mass effect.
2. No new intracranial abnormality.

## 2017-06-07 ENCOUNTER — Other Ambulatory Visit: Payer: Self-pay | Admitting: Obstetrics & Gynecology

## 2017-06-07 ENCOUNTER — Ambulatory Visit (INDEPENDENT_AMBULATORY_CARE_PROVIDER_SITE_OTHER): Payer: Medicare Other | Admitting: Internal Medicine

## 2017-06-07 ENCOUNTER — Encounter: Payer: Self-pay | Admitting: Internal Medicine

## 2017-06-07 VITALS — BP 136/72 | HR 66 | Temp 99.0°F | Ht 64.0 in | Wt 131.0 lb

## 2017-06-07 DIAGNOSIS — M79604 Pain in right leg: Secondary | ICD-10-CM | POA: Insufficient documentation

## 2017-06-07 DIAGNOSIS — Z1231 Encounter for screening mammogram for malignant neoplasm of breast: Secondary | ICD-10-CM

## 2017-06-07 DIAGNOSIS — I10 Essential (primary) hypertension: Secondary | ICD-10-CM

## 2017-06-07 DIAGNOSIS — E876 Hypokalemia: Secondary | ICD-10-CM | POA: Diagnosis not present

## 2017-06-07 DIAGNOSIS — E785 Hyperlipidemia, unspecified: Secondary | ICD-10-CM

## 2017-06-07 MED ORDER — ALPRAZOLAM 0.25 MG PO TABS: ORAL_TABLET | ORAL | 2 refills | Status: DC

## 2017-06-07 MED ORDER — ZOLPIDEM TARTRATE 5 MG PO TABS: ORAL_TABLET | ORAL | 1 refills | Status: DC

## 2017-06-07 NOTE — Assessment & Plan Note (Signed)
Hoarse fell on the pt's R leg 3 wks ago; MRI - thigh hematoma. F/u w/ortho

## 2017-06-07 NOTE — Progress Notes (Signed)
Subjective:  Patient ID: Sue [name], female    DOB: 05-Aug-1951  Age: 66 y.o. MRN: [id number]  CC: No chief complaint on file.   HPI Sue [name] presents for HTN, anxiety f/u Hoarse fell on the pt's R leg 3 wks ago; MRI - thigh hematoma. Head CT was ok  Outpatient Medications Prior to Visit  Medication Sig Dispense Refill  . acetaminophen (TYLENOL) 325 MG tablet Take 2 tablets (650 mg total) by mouth every 6 (six) hours as needed for mild pain (or temp > 100).    . ALPRAZolam (XANAX) 0.25 MG tablet TAKE 1 TABLET BY MOUTH TWICE A DAY AS NEEDED FOR ANXIETY 60 tablet 2  . Desoximetasone (TOPICORT) 0.25 % ointment Apply bid for aphtous ulcer 15 g 2  . diclofenac (VOLTAREN) 75 MG EC tablet If you use this do not take Ibuprofen or naprosyn, all are NSAID'S, and can be hard on your stomach if you use more than one.  3  . diclofenac (VOLTAREN) 75 MG EC tablet TAKE 1 TABLET TWICE A DAY AS NEEDED 60 tablet 2  . Esterified Estrogens (MENEST) 0.3 MG tablet Take 0.15 mg by mouth every other day. take 1/2 tablet my mouth every other day     . ESTRACE VAGINAL 0.1 MG/GM vaginal cream Place 1 Applicatorful vaginally once a week.     . metoprolol succinate (TOPROL-XL) 25 MG 24 hr tablet Take 1 tablet (25 mg total) by mouth daily. 90 tablet 3  . Multiple Vitamin (MULTIVITAMIN) capsule Take 1 capsule by mouth daily.      Marland Kitchen OVER THE COUNTER MEDICATION Take 1 tablet by mouth 2 (two) times daily.    . polyethylene glycol powder (MIRALAX) powder Mix one cap full with at least 8 oz of liquid and drink once daily. You can use this if needed.  If your having Bowel movements without it you do not need it. 255 g 0  . potassium chloride (KLOR-CON) 8 MEQ tablet Take 1 tablet (8 mEq total) by mouth daily. 90 tablet 3  . Potassium Gluconate 550 MG TABS Take 1 tablet by mouth daily.    Marland Kitchen triamterene-hydrochlorothiazide (MAXZIDE-25) 37.5-25 MG tablet Take 1 tablet by mouth daily. 90 tablet 3  . XIIDRA 5 %  SOLN Place 1 drop into both eyes 2 (two) times daily.  2  . zolpidem (AMBIEN) 5 MG tablet TAKE 1 TABLET AT BEDTIME AS NEEDED SLEEP 90 tablet 1   No facility-administered medications prior to visit.     ROS Review of Systems  Constitutional: Negative for activity change, appetite change, chills, fatigue and unexpected weight change.  HENT: Negative for congestion, mouth sores and sinus pressure.   Eyes: Negative for visual disturbance.  Respiratory: Negative for cough and chest tightness.   Gastrointestinal: Negative for abdominal pain and nausea.  Genitourinary: Negative for difficulty urinating, frequency and vaginal pain.  Musculoskeletal: Negative for back pain and gait problem.  Skin: Negative for pallor and rash.  Neurological: Negative for dizziness, tremors, weakness, numbness and headaches.  Psychiatric/Behavioral: Negative for confusion and sleep disturbance.    Objective:  BP 136/72 (BP Location: Left Arm, Patient Position: Sitting, Cuff Size: Normal)   Pulse 66   Temp 99 F (37.2 C)   Ht 5\' 4"  (1.626 m)   Wt 131 lb (59.4 kg)   SpO2 99%   BMI 22.49 kg/m   BP Readings from Last 3 Encounters:  06/07/17 136/72  04/29/17 139/72  12/07/16 132/76  Wt Readings from Last 3 Encounters:  06/07/17 131 lb (59.4 kg)  12/07/16 132 lb 8 oz (60.1 kg)  07/28/16 132 lb (59.9 kg)    Physical Exam  Constitutional: She appears well-developed. No distress.  HENT:  Head: Normocephalic.  Right Ear: External ear normal.  Left Ear: External ear normal.  Nose: Nose normal.  Mouth/Throat: Oropharynx is clear and moist.  Eyes: Pupils are equal, round, and reactive to light. Conjunctivae are normal. Right eye exhibits no discharge. Left eye exhibits no discharge.  Neck: Normal range of motion. Neck supple. No JVD present. No tracheal deviation present. No thyromegaly present.  Cardiovascular: Normal rate, regular rhythm and normal heart sounds.   Pulmonary/Chest: No stridor. No  respiratory distress. She has no wheezes.  Abdominal: Soft. Bowel sounds are normal. She exhibits no distension and no mass. There is no tenderness. There is no rebound and no guarding.  Musculoskeletal: She exhibits tenderness. She exhibits no edema.  Lymphadenopathy:    She has no cervical adenopathy.  Neurological: She displays normal reflexes. No cranial nerve deficit. She exhibits normal muscle tone. Coordination normal.  Skin: No rash noted. No erythema.  Psychiatric: She has a normal mood and affect. Her behavior is normal. Judgment and thought content normal.   R leg is tender, lateral distal thigh/knee w/swelling   Lab Results  Component Value Date   WBC 6.4 12/03/2016   HGB 14.2 12/03/2016   HCT 42.3 12/03/2016   PLT 286.0 12/03/2016   GLUCOSE 94 05/19/2017   CHOL 223 (H) 12/03/2016   TRIG 87.0 12/03/2016   HDL 60.00 12/03/2016   LDLDIRECT 118.2 07/19/2013   LDLCALC 145 (H) 12/03/2016   ALT 17 12/03/2016   AST 17 12/03/2016   NA 140 05/19/2017   K 4.0 05/19/2017   CL 102 05/19/2017   CREATININE 0.79 05/19/2017   BUN 13 05/19/2017   CO2 29 05/19/2017   TSH 3.11 12/03/2016    Dg Bone Density  Result Date: 11/03/2016 EXAM: DUAL X-RAY ABSORPTIOMETRY (DXA) FOR BONE MINERAL DENSITY IMPRESSION: Referring Physician:  Cassandria Anger PATIENT: Name: Sue [name] Patient ID: [id number] Birth Date: [birth year] Height: 64.3 in. Sex: Female Measured: 11/03/2016 Weight: 134.4 lbs. Indications: Estrogen Deficient, Postmenopausal Fractures: None Treatments: Calcium (E943.0), Estrace tablet, Vitamin D (E933.5) ASSESSMENT: The BMD measured at Femur Neck Right is 1.052 g/cm2 with a T-score of 0.1. This patient is considered normal according to [facility]) criteria. L-3 and L-4 were excluded due to degenerative changes. Per the official positions of the ISCD, it is not possible to quantitatively compare BMD or calculate an [facility] between exams done at different  facilities. Site Region Measured Date Measured Age YA BMD Significant CHANGE T-score DualFemur Neck Right 11/03/2016    67.8         0.1     1.052 g/cm2 AP Spine  L1-L2      9/03/2016    67.8         2.3     1.453 g/cm2 World Health Organization [facility]) criteria for post-menopausal, Caucasian Women: Normal       T-score at or above -1 SD Osteopenia   T-score between -1 and -2.5 SD Osteoporosis T-score at or below -2.5 SD RECOMMENDATION: [place] recommends that FDA-approved medical therapies be considered in postmenopausal women and men age 36 or older with a: 1. Hip or vertebral (clinical or morphometric) fracture. 2. T-score of <-2.5 at the spine or hip. 3. Ten-year fracture probability by  FRAX of 3% or greater for hip fracture or 20% or greater for major osteoporotic fracture. All treatment decisions require clinical judgment and consideration of individual patient factors, including patient preferences, co-morbidities, previous drug use, risk factors not captured in the FRAX model (e.g. falls, vitamin D deficiency, increased bone turnover, interval significant decline in bone density) and possible under - or over-estimation of fracture risk by FRAX. All patients should ensure an adequate intake of dietary calcium (1200 mg/d) and vitamin D (800 IU daily) unless contraindicated. FOLLOW-UP: People with diagnosed cases of osteoporosis or at high risk for fracture should have regular bone mineral density tests. For patients eligible for Medicare, routine testing is allowed once every 2 years. The testing frequency can be increased to one year for patients who have rapidly progressing disease, those who are receiving or discontinuing medical therapy to restore bone mass, or have additional risk factors. I have reviewed this report, and agree with the above findings. [facility] Electronically Signed   By: Lahoma Crocker M.D.   On: 11/03/2016 14:33    Assessment & Plan:   There are no  diagnoses linked to this encounter. I am having Ms. Panning maintain her Esterified Estrogens, multivitamin, Desoximetasone, ESTRACE VAGINAL, XIIDRA, OVER THE COUNTER MEDICATION, Potassium Gluconate, metoprolol succinate, triamterene-hydrochlorothiazide, acetaminophen, diclofenac, polyethylene glycol powder, zolpidem, potassium chloride, diclofenac, and ALPRAZolam.  No orders of the defined types were placed in this encounter.    Follow-up: No Follow-up on file.  Walker Kehr, MD

## 2017-06-07 NOTE — Assessment & Plan Note (Signed)
Toprol  Maxzide 

## 2017-06-08 DIAGNOSIS — M25561 Pain in right knee: Secondary | ICD-10-CM | POA: Diagnosis not present

## 2017-06-21 DIAGNOSIS — M25561 Pain in right knee: Secondary | ICD-10-CM | POA: Diagnosis not present

## 2017-06-29 NOTE — Progress Notes (Signed)
Patient presents with painful lesions making it painful and difficult for her to walk   All other systems negative  Debrided lesions with iatrogenic bleeding noted, advised on padding areas and return to clinic as needed

## 2017-07-05 ENCOUNTER — Ambulatory Visit: Payer: Medicare Other

## 2017-07-14 ENCOUNTER — Ambulatory Visit
Admission: RE | Admit: 2017-07-14 | Discharge: 2017-07-14 | Disposition: A | Discharge status: Home / Self Care | Payer: Medicare Other | Source: Ambulatory Visit | Attending: Obstetrics & Gynecology | Admitting: Obstetrics & Gynecology

## 2017-07-14 DIAGNOSIS — Z1231 Encounter for screening mammogram for malignant neoplasm of breast: Secondary | ICD-10-CM | POA: Diagnosis not present

## 2017-07-19 DIAGNOSIS — L309 Dermatitis, unspecified: Secondary | ICD-10-CM | POA: Diagnosis not present

## 2017-08-09 ENCOUNTER — Other Ambulatory Visit: Payer: Self-pay | Admitting: Internal Medicine

## 2017-08-23 DIAGNOSIS — L812 Freckles: Secondary | ICD-10-CM | POA: Diagnosis not present

## 2017-08-23 DIAGNOSIS — D1801 Hemangioma of skin and subcutaneous tissue: Secondary | ICD-10-CM | POA: Diagnosis not present

## 2017-08-23 DIAGNOSIS — D225 Melanocytic nevi of trunk: Secondary | ICD-10-CM | POA: Diagnosis not present

## 2017-08-23 DIAGNOSIS — L821 Other seborrheic keratosis: Secondary | ICD-10-CM | POA: Diagnosis not present

## 2017-08-23 DIAGNOSIS — L853 Xerosis cutis: Secondary | ICD-10-CM | POA: Diagnosis not present

## 2017-09-19 ENCOUNTER — Other Ambulatory Visit: Payer: Self-pay | Admitting: Internal Medicine

## 2017-10-04 ENCOUNTER — Ambulatory Visit: Payer: Medicare Other | Admitting: Internal Medicine

## 2017-10-24 DIAGNOSIS — D481 Neoplasm of uncertain behavior of connective and other soft tissue: Secondary | ICD-10-CM | POA: Diagnosis not present

## 2017-10-31 ENCOUNTER — Encounter: Payer: Self-pay | Admitting: Obstetrics & Gynecology

## 2017-10-31 ENCOUNTER — Ambulatory Visit (INDEPENDENT_AMBULATORY_CARE_PROVIDER_SITE_OTHER): Payer: Medicare Other | Admitting: Obstetrics & Gynecology

## 2017-10-31 VITALS — BP 120/72 | Ht 64.5 in | Wt 134.0 lb

## 2017-10-31 DIAGNOSIS — N952 Postmenopausal atrophic vaginitis: Secondary | ICD-10-CM

## 2017-10-31 DIAGNOSIS — Z780 Asymptomatic menopausal state: Secondary | ICD-10-CM

## 2017-10-31 DIAGNOSIS — Z01411 Encounter for gynecological examination (general) (routine) with abnormal findings: Secondary | ICD-10-CM

## 2017-10-31 DIAGNOSIS — Z01419 Encounter for gynecological examination (general) (routine) without abnormal findings: Secondary | ICD-10-CM | POA: Diagnosis not present

## 2017-10-31 MED ORDER — ESTRACE 0.1 MG/GM VA CREA: 1.0000 | TOPICAL_CREAM | VAGINAL | 3 refills | Status: DC

## 2017-10-31 MED ORDER — ESTERIFIED ESTROGENS 0.3 MG PO TABS: 0.1500 mg | ORAL_TABLET | ORAL | 4 refills | Status: DC

## 2017-10-31 NOTE — Patient Instructions (Signed)
1. Encounter for gynecological examination with abnormal finding Normal gynecologic exam.  Pap test negative January 2018.  Will repeat every 2-3 years.  Breast exam normal.  Negative screening mammogram November 2018.  Colonoscopy in 2010.  Health labs with family physician.  2. Menopause present Well on Menest 0.3 mg/tab. taking half a tablet every 2 days.  No postmenopausal bleeding.  Declines Progesterone.  Recommendation to stop hormone replacement therapy discussed previously and today, but patient understands the risks of breast cancer and the risks of blood clots/strokes, and chooses to continue on the same regimen in spite of the risks.  Last bone density in February 2018 was completely normal.  3. Post-menopause atrophic vaginitis Estrace vaginal cream represcribed, uses once a week.  Other orders - ESTRACE VAGINAL 0.1 MG/GM vaginal cream; Place 1 Applicatorful vaginally once a week. - Esterified Estrogens (MENEST) 0.3 MG tablet; Take 0.5 tablets (0.15 mg total) by mouth every other day. take 1/2 tablet my mouth every other day  Sue [name], it was a pleasure seeing you today!   Health Maintenance for Postmenopausal Women Menopause is a normal process in which your reproductive ability comes to an end. This process happens gradually over a span of months to years, usually between the ages of 69 and 96. Menopause is complete when you have missed 12 consecutive menstrual periods. It is important to talk with your health care provider about some of the most common conditions that affect postmenopausal women, such as heart disease, cancer, and bone loss (osteoporosis). Adopting a healthy lifestyle and getting preventive care can help to promote your health and wellness. Those actions can also lower your chances of developing some of these common conditions. What should I know about menopause? During menopause, you may experience a number of symptoms, such as:  Moderate-to-severe hot  flashes.  Night sweats.  Decrease in sex drive.  Mood swings.  Headaches.  Tiredness.  Irritability.  Memory problems.  Insomnia.  Choosing to treat or not to treat menopausal changes is an individual decision that you make with your health care provider. What should I know about hormone replacement therapy and supplements? Hormone therapy products are effective for treating symptoms that are associated with menopause, such as hot flashes and night sweats. Hormone replacement carries certain risks, especially as you become older. If you are thinking about using estrogen or estrogen with progestin treatments, discuss the benefits and risks with your health care provider. What should I know about heart disease and stroke? Heart disease, heart attack, and stroke become more likely as you age. This may be due, in part, to the hormonal changes that your body experiences during menopause. These can affect how your body processes dietary fats, triglycerides, and cholesterol. Heart attack and stroke are both medical emergencies. There are many things that you can do to help prevent heart disease and stroke:  Have your blood pressure checked at least every 1-2 years. High blood pressure causes heart disease and increases the risk of stroke.  If you are 52-38 years old, ask your health care provider if you should take aspirin to prevent a heart attack or a stroke.  Do not use any tobacco products, including cigarettes, chewing tobacco, or electronic cigarettes. If you need help quitting, ask your health care provider.  It is important to eat a healthy diet and maintain a healthy weight. ? Be sure to include plenty of vegetables, fruits, low-fat dairy products, and lean protein. ? Avoid eating foods that are high in solid  fats, added sugars, or salt (sodium).  Get regular exercise. This is one of the most important things that you can do for your health. ? Try to exercise for at least 150  minutes each week. The type of exercise that you do should increase your heart rate and make you sweat. This is known as moderate-intensity exercise. ? Try to do strengthening exercises at least twice each week. Do these in addition to the moderate-intensity exercise.  Know your numbers.Ask your health care provider to check your cholesterol and your blood glucose. Continue to have your blood tested as directed by your health care provider.  What should I know about cancer screening? There are several types of cancer. Take the following steps to reduce your risk and to catch any cancer development as early as possible. Breast Cancer  Practice breast self-awareness. ? This means understanding how your breasts normally appear and feel. ? It also means doing regular breast self-exams. Let your health care provider know about any changes, no matter how small.  If you are 61 or older, have a clinician do a breast exam (clinical breast exam or CBE) every year. Depending on your age, family history, and medical history, it may be recommended that you also have a yearly breast X-ray (mammogram).  If you have a family history of breast cancer, talk with your health care provider about genetic screening.  If you are at high risk for breast cancer, talk with your health care provider about having an MRI and a mammogram every year.  Breast cancer (BRCA) gene test is recommended for women who have family members with BRCA-related cancers. Results of the assessment will determine the need for genetic counseling and BRCA1 and for BRCA2 testing. BRCA-related cancers include these types: ? Breast. This occurs in males or females. ? Ovarian. ? Tubal. This may also be called fallopian tube cancer. ? Cancer of the abdominal or pelvic lining (peritoneal cancer). ? Prostate. ? Pancreatic.  Cervical, Uterine, and Ovarian Cancer Your health care provider may recommend that you be screened regularly for cancer  of the pelvic organs. These include your ovaries, uterus, and vagina. This screening involves a pelvic exam, which includes checking for microscopic changes to the surface of your cervix (Pap test).  For women ages 21-65, health care providers may recommend a pelvic exam and a Pap test every three years. For women ages 54-65, they may recommend the Pap test and pelvic exam, combined with testing for human papilloma virus (HPV), every five years. Some types of HPV increase your risk of cervical cancer. Testing for HPV may also be done on women of any age who have unclear Pap test results.  Other health care providers may not recommend any screening for nonpregnant women who are considered low risk for pelvic cancer and have no symptoms. Ask your health care provider if a screening pelvic exam is right for you.  If you have had past treatment for cervical cancer or a condition that could lead to cancer, you need Pap tests and screening for cancer for at least 20 years after your treatment. If Pap tests have been discontinued for you, your risk factors (such as having a new sexual partner) need to be reassessed to determine if you should start having screenings again. Some women have medical problems that increase the chance of getting cervical cancer. In these cases, your health care provider may recommend that you have screening and Pap tests more often.  If you have a  family history of uterine cancer or ovarian cancer, talk with your health care provider about genetic screening.  If you have vaginal bleeding after reaching menopause, tell your health care provider.  There are currently no reliable tests available to screen for ovarian cancer.  Lung Cancer Lung cancer screening is recommended for adults 38-55 years old who are at high risk for lung cancer because of a history of smoking. A yearly low-dose CT scan of the lungs is recommended if you:  Currently smoke.  Have a history of at least 30  pack-years of smoking and you currently smoke or have quit within the past 15 years. A pack-year is smoking an average of one pack of cigarettes per day for one year.  Yearly screening should:  Continue until it has been 15 years since you quit.  Stop if you develop a health problem that would prevent you from having lung cancer treatment.  Colorectal Cancer  This type of cancer can be detected and can often be prevented.  Routine colorectal cancer screening usually begins at age 33 and continues through age 29.  If you have risk factors for colon cancer, your health care provider may recommend that you be screened at an earlier age.  If you have a family history of colorectal cancer, talk with your health care provider about genetic screening.  Your health care provider may also recommend using home test kits to check for hidden blood in your stool.  A small camera at the end of a tube can be used to examine your colon directly (sigmoidoscopy or colonoscopy). This is done to check for the earliest forms of colorectal cancer.  Direct examination of the colon should be repeated every 5-10 years until age 64. However, if early forms of precancerous polyps or small growths are found or if you have a family history or genetic risk for colorectal cancer, you may need to be screened more often.  Skin Cancer  Check your skin from head to toe regularly.  Monitor any moles. Be sure to tell your health care provider: ? About any new moles or changes in moles, especially if there is a change in a mole's shape or color. ? If you have a mole that is larger than the size of a pencil eraser.  If any of your family members has a history of skin cancer, especially at a young age, talk with your health care provider about genetic screening.  Always use sunscreen. Apply sunscreen liberally and repeatedly throughout the day.  Whenever you are outside, protect yourself by wearing long sleeves, pants,  a wide-brimmed hat, and sunglasses.  What should I know about osteoporosis? Osteoporosis is a condition in which bone destruction happens more quickly than new bone creation. After menopause, you may be at an increased risk for osteoporosis. To help prevent osteoporosis or the bone fractures that can happen because of osteoporosis, the following is recommended:  If you are 4-108 years old, get at least 1,000 mg of calcium and at least 600 mg of vitamin D per day.  If you are older than age 47 but younger than age 31, get at least 1,200 mg of calcium and at least 600 mg of vitamin D per day.  If you are older than age 37, get at least 1,200 mg of calcium and at least 800 mg of vitamin D per day.  Smoking and excessive alcohol intake increase the risk of osteoporosis. Eat foods that are rich in calcium and vitamin  D, and do weight-bearing exercises several times each week as directed by your health care provider. What should I know about how menopause affects my mental health? Depression may occur at any age, but it is more common as you become older. Common symptoms of depression include:  Low or sad mood.  Changes in sleep patterns.  Changes in appetite or eating patterns.  Feeling an overall lack of motivation or enjoyment of activities that you previously enjoyed.  Frequent crying spells.  Talk with your health care provider if you think that you are experiencing depression. What should I know about immunizations? It is important that you get and maintain your immunizations. These include:  Tetanus, diphtheria, and pertussis (Tdap) booster vaccine.  Influenza every year before the flu season begins.  Pneumonia vaccine.  Shingles vaccine.  Your health care provider may also recommend other immunizations. This information is not intended to replace advice given to you by your health care provider. Make sure you discuss any questions you have with your health care  provider. Document Released: 10/22/2005 Document Revised: 03/19/2016 Document Reviewed: 06/03/2015 Elsevier Interactive Patient Education  2018 [employer].

## 2017-10-31 NOTE — Progress Notes (Signed)
Sue [name] 06-Mar-1951 [id number]   History:    67 y.o. G1P1L2 Long standing boyfriend.  RP:  Established patient presenting for annual gyn exam   HPI: Menopause, well on Menest 0.3 1/2 tab q2 days, refuses to d/c. Declined combining with Progesterone in the past.  Estrace cream once a week as.  No PMB.  No pelvic pain.  Abstinent.  H/O LEEP 2008.  Paps normal since then, last pap 09/2016 Negative.  Urine/BMs wnl.  Had a bowel resection because of perforation associated with diverticulitis in 2017, no GI problem since then.  Breasts wnl.  Plays tennis.  BMI 22.65  Past medical history,surgical history, family history and social history were all reviewed and documented in the EPIC chart.  Gynecologic History No LMP recorded. Patient is postmenopausal. Contraception: post menopausal status Last Pap: 09/2016. Results were: Negative Last mammogram: 07/2017. Results were: Negative Bone Density: Normal 10/2016 Colonoscopy: 2010  Obstetric History OB History  Gravida Para Term Preterm AB Living  1 1       1   SAB TAB Ectopic Multiple Live Births               # Outcome Date GA Lbr Len/2nd Weight Sex Delivery Anes PTL Lv  1 Para                ROS: A ROS was performed and pertinent positives and negatives are included in the history.  GENERAL: No fevers or chills. HEENT: No change in vision, no earache, sore throat or sinus congestion. NECK: No pain or stiffness. CARDIOVASCULAR: No chest pain or pressure. No palpitations. PULMONARY: No shortness of breath, cough or wheeze. GASTROINTESTINAL: No abdominal pain, nausea, vomiting or diarrhea, melena or bright red blood per rectum. GENITOURINARY: No urinary frequency, urgency, hesitancy or dysuria. MUSCULOSKELETAL: No joint or muscle pain, no back pain, no recent trauma. DERMATOLOGIC: No rash, no itching, no lesions. ENDOCRINE: No polyuria, polydipsia, no heat or cold intolerance. No recent change in weight. HEMATOLOGICAL: No anemia or  easy bruising or bleeding. NEUROLOGIC: No headache, seizures, numbness, tingling or weakness. PSYCHIATRIC: No depression, no loss of interest in normal activity or change in sleep pattern.     Exam:   BP 120/72   Ht 5' 4.5" (1.638 m)   Wt 134 lb (60.8 kg)   BMI 22.65 kg/m   Body mass index is 22.65 kg/m.  General appearance : Well developed well nourished female. No acute distress HEENT: Eyes: no retinal hemorrhage or exudates,  Neck supple, trachea midline, no carotid bruits, no thyroidmegaly Lungs: Clear to auscultation, no rhonchi or wheezes, or rib retractions  Heart: Regular rate and rhythm, no murmurs or gallops Breast:Examined in sitting and supine position were symmetrical in appearance, no palpable masses or tenderness,  no skin retraction, no nipple inversion, no nipple discharge, no skin discoloration, no axillary or supraclavicular lymphadenopathy Abdomen: no palpable masses or tenderness, no rebound or guarding Extremities: no edema or skin discoloration or tenderness  Pelvic: Vulva: Normal             Vagina: No gross lesions or discharge  Cervix: No gross lesions or discharge  Uterus  AV, normal size, shape and consistency, non-tender and mobile  Adnexa  Without masses or tenderness  Anus: Normal   Assessment/Plan:  67 y.o. female for annual exam   1. Encounter for gynecological examination with abnormal finding Normal gynecologic exam.  Pap test negative January 2018.  Will repeat every 2-3 years.  Breast  exam normal.  Negative screening mammogram November 2018.  Colonoscopy in 2010.  Health labs with family physician.  2. Menopause present Well on Menest 0.3 mg/tab. taking half a tablet every 2 days.  No postmenopausal bleeding.  Declines Progesterone.  Recommendation to stop hormone replacement therapy discussed previously and today, but patient understands the risks of breast cancer and the risks of blood clots/strokes, and chooses to continue on the same  regimen in spite of the risks.  Last bone density in February 2018 was completely normal.  3. Post-menopause atrophic vaginitis Estrace vaginal cream represcribed, uses once a week.  Other orders - ESTRACE VAGINAL 0.1 MG/GM vaginal cream; Place 1 Applicatorful vaginally once a week. - Esterified Estrogens (MENEST) 0.3 MG tablet; Take 0.5 tablets (0.15 mg total) by mouth every other day. take 1/2 tablet my mouth every other day  Counseling on above issues more than 50% for 10 minutes.  Princess Bruins MD, 1:26 PM 10/31/2017

## 2017-11-04 ENCOUNTER — Other Ambulatory Visit: Payer: Self-pay | Admitting: Internal Medicine

## 2017-12-02 DIAGNOSIS — L718 Other rosacea: Secondary | ICD-10-CM | POA: Diagnosis not present

## 2017-12-08 ENCOUNTER — Ambulatory Visit: Payer: Medicare Other | Admitting: Internal Medicine

## 2017-12-08 ENCOUNTER — Other Ambulatory Visit: Payer: Self-pay | Admitting: Internal Medicine

## 2018-01-05 ENCOUNTER — Ambulatory Visit (INDEPENDENT_AMBULATORY_CARE_PROVIDER_SITE_OTHER): Payer: Medicare Other | Admitting: Podiatry

## 2018-01-05 ENCOUNTER — Ambulatory Visit: Payer: Medicare Other

## 2018-01-05 ENCOUNTER — Encounter: Payer: Self-pay | Admitting: Podiatry

## 2018-01-05 DIAGNOSIS — R52 Pain, unspecified: Secondary | ICD-10-CM

## 2018-01-05 DIAGNOSIS — L84 Corns and callosities: Secondary | ICD-10-CM

## 2018-01-05 NOTE — Progress Notes (Signed)
Subjective:   Patient ID: Sue [name], female   DOB: 67 y.o.   MRN: [id number]   HPI Patient presents with a lot of pain in the left forefoot and states the lesion is hard to walk   ROS      Objective:  Physical Exam  Neurovascular status intact with patient found to have keratotic lesion left forefoot     Assessment:  Inflammatory porokeratosis type lesion left foot     Plan:  Sharp debridement accomplished today

## 2018-01-28 ENCOUNTER — Other Ambulatory Visit: Payer: Self-pay | Admitting: Internal Medicine

## 2018-01-31 ENCOUNTER — Telehealth: Payer: Self-pay | Admitting: *Deleted

## 2018-01-31 DIAGNOSIS — D225 Melanocytic nevi of trunk: Secondary | ICD-10-CM | POA: Diagnosis not present

## 2018-01-31 DIAGNOSIS — L821 Other seborrheic keratosis: Secondary | ICD-10-CM | POA: Diagnosis not present

## 2018-01-31 DIAGNOSIS — L812 Freckles: Secondary | ICD-10-CM | POA: Diagnosis not present

## 2018-01-31 DIAGNOSIS — D2239 Melanocytic nevi of other parts of face: Secondary | ICD-10-CM | POA: Diagnosis not present

## 2018-01-31 DIAGNOSIS — D1801 Hemangioma of skin and subcutaneous tissue: Secondary | ICD-10-CM | POA: Diagnosis not present

## 2018-01-31 NOTE — Telephone Encounter (Signed)
Left message for pt to call.

## 2018-01-31 NOTE — Telephone Encounter (Signed)
Patient takes Menest 0.3 mg tablet ( half a tablet every other day) the issue is being that the  directions are 1/2 tablet every other day, she is only getting 8 tablets at a time. She travels a lot and that requires frequent refills/ copays for Rx. She asked if directions can be switched to 1 po daily and she continues to takes 1/2 tablet every other day as she has been doing for years, just so she can get 30 tablets? Please advise

## 2018-01-31 NOTE — Telephone Encounter (Signed)
So, I suppose her insurance would not agree with a 3 month refill (24 pills)?  If not, can change the prescription as long as patient continues same dosage given her age.

## 2018-02-01 MED ORDER — ESTERIFIED ESTROGENS 0.3 MG PO TABS: 0.1500 mg | ORAL_TABLET | Freq: Every day | ORAL | 3 refills | Status: DC

## 2018-02-01 MED ORDER — ESTERIFIED ESTROGENS 0.3 MG PO TABS: 0.3000 mg | ORAL_TABLET | Freq: Every day | ORAL | 3 refills | Status: DC

## 2018-02-01 NOTE — Telephone Encounter (Signed)
Patient informed with the below note,states the 1 po daily Rx will work best for her. Rx sent. She is aware to only take 1/2 tablet every other day

## 2018-02-08 IMAGING — CT CT ABD-PELV W/ CM
2 of 5 series · 15 of 46 positions shown, 17 images · IV contrast (ISOVUE 300)
Comparison: None.

CLINICAL DATA: Lower abdominal pain for several weeks.

EXAM:
CT ABDOMEN AND PELVIS WITH CONTRAST
TECHNIQUE: Multidetector CT imaging of the abdomen and pelvis was performed
using the standard protocol following bolus administration of
intravenous contrast.
CONTRAST:  100mL EUPN0G-8YY IOPAMIDOL (EUPN0G-8YY) INJECTION 61%

[Series 3: abd/ pelvis · axial · 0.67mm/px · z∈[+803,+1208]mm · 12 of 91 slices shown, 14 images]
[im 5/91  soft-tissue]
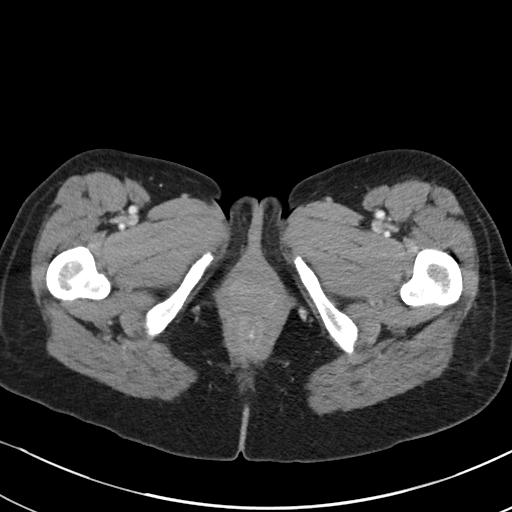
[im 5/91  bone]
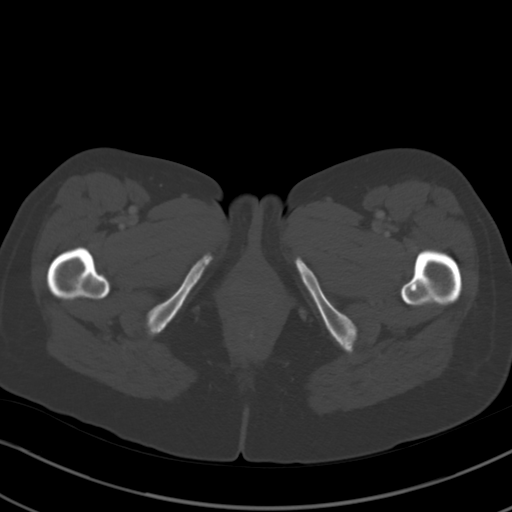
[im 15/91  soft-tissue]
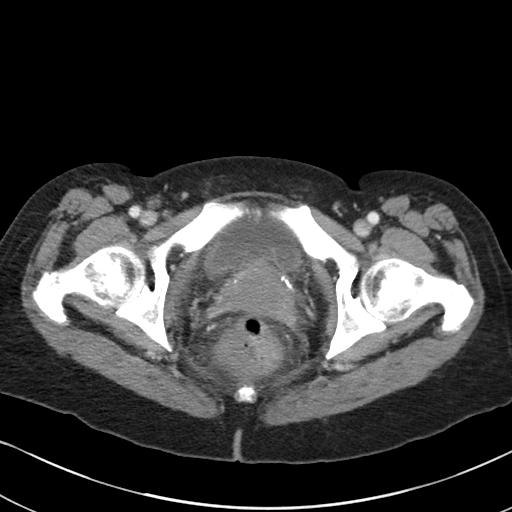
[im 19/91  soft-tissue]
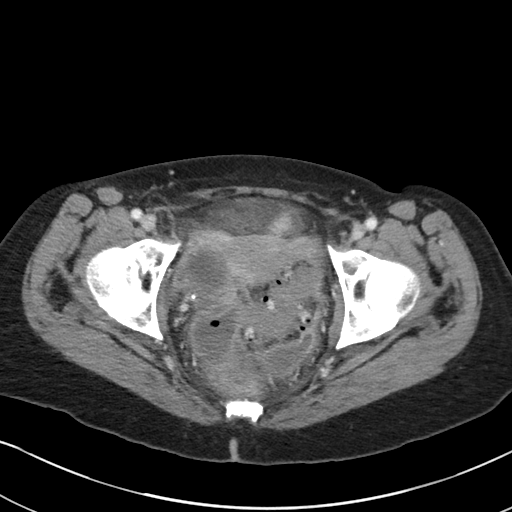
[im 29/91  soft-tissue]
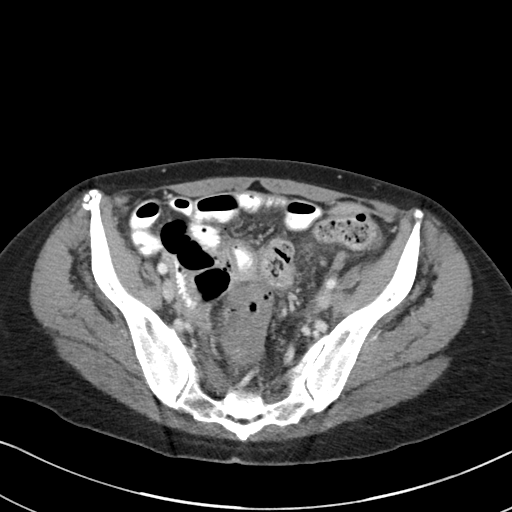
[im 34/91  soft-tissue]
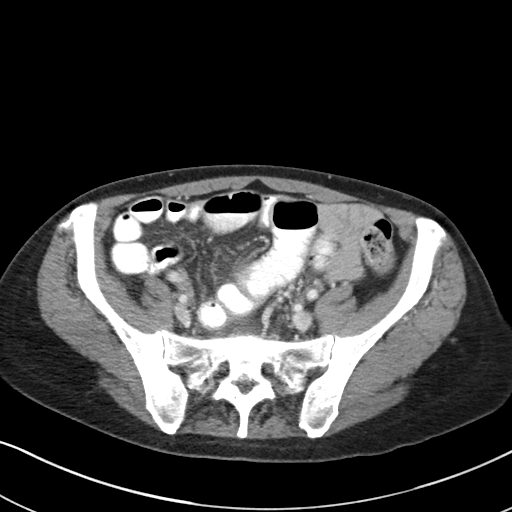
[im 43/91  soft-tissue]
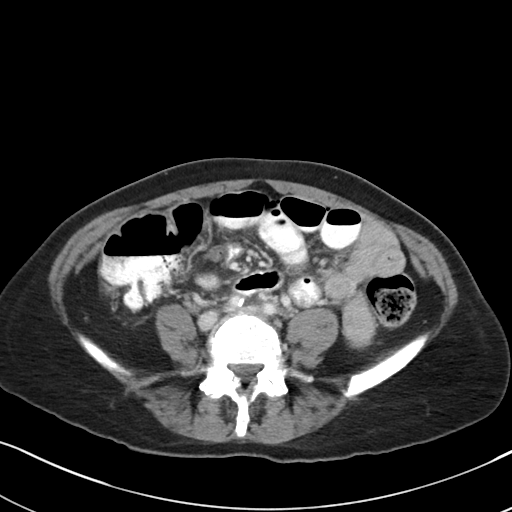
[im 48/91  soft-tissue]
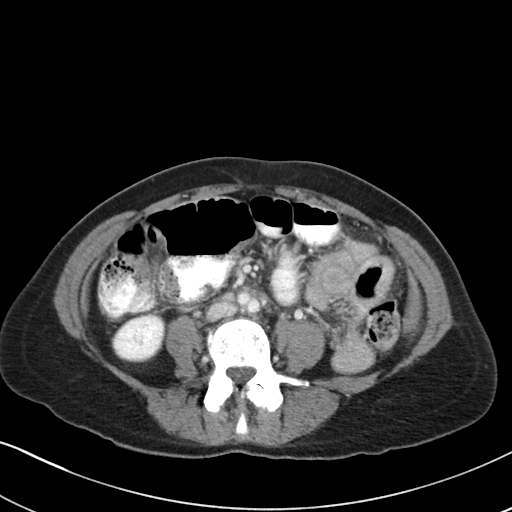
[im 57/91  soft-tissue]
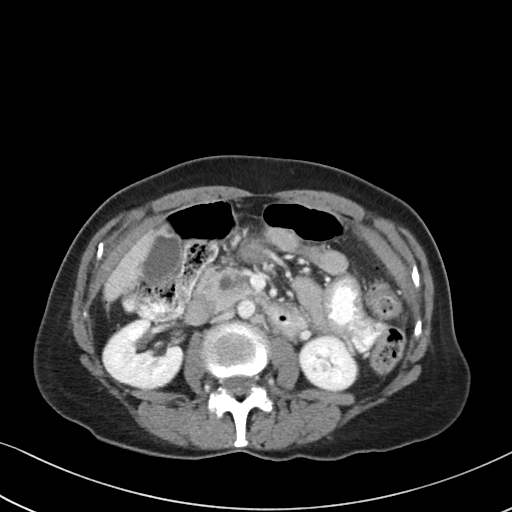
[im 62/91  soft-tissue]
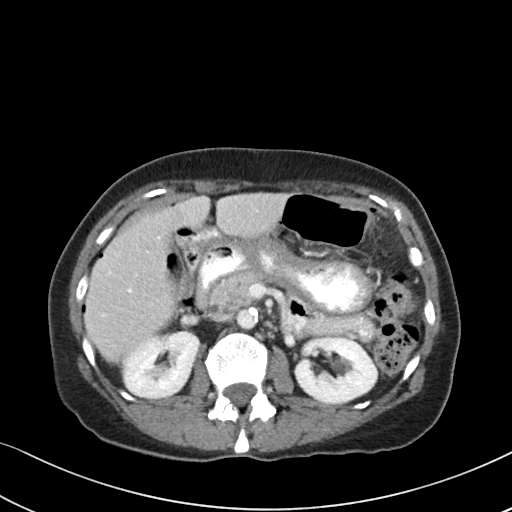
[im 62/91  bone]
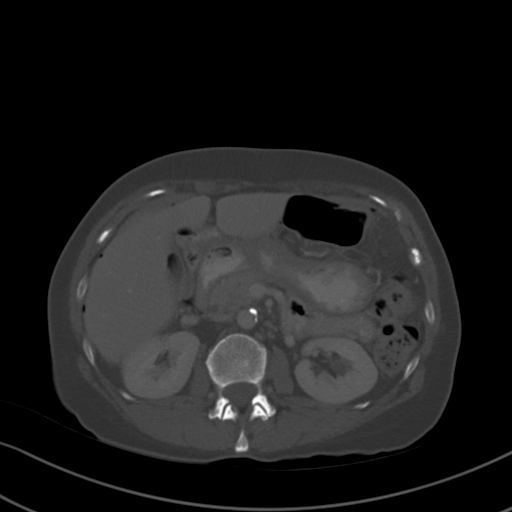
[im 72/91  soft-tissue]
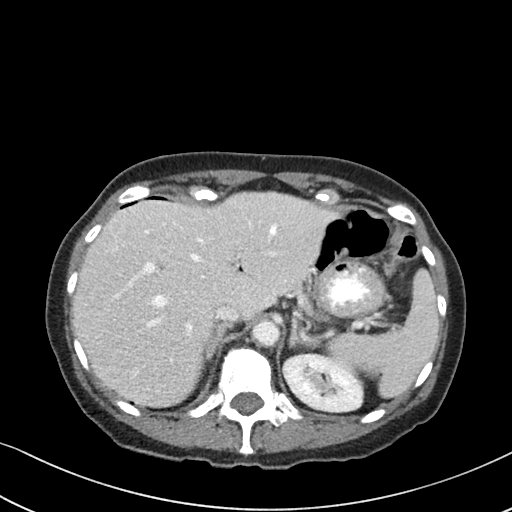
[im 76/91  soft-tissue]
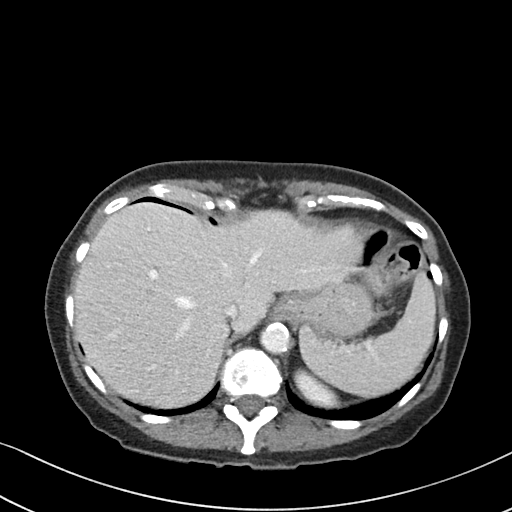
[im 86/91  soft-tissue]
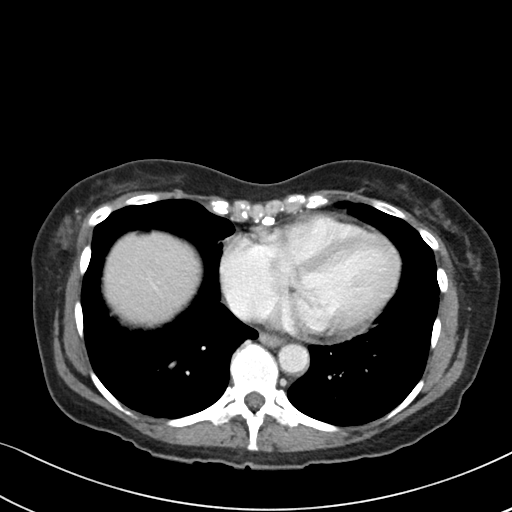

[Series 5: coronal soft tissue · coronal · 0.57mm/px · 3 of 70 slices shown]
[im 24/70  soft-tissue]
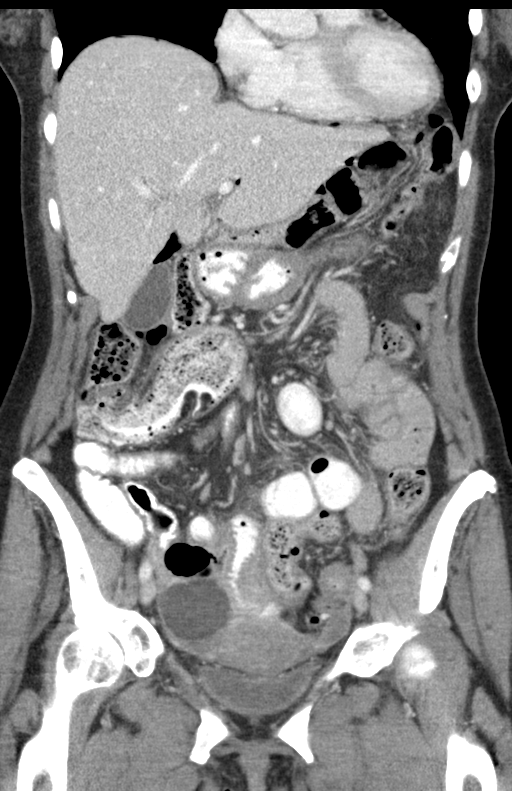
[im 31/70  soft-tissue]
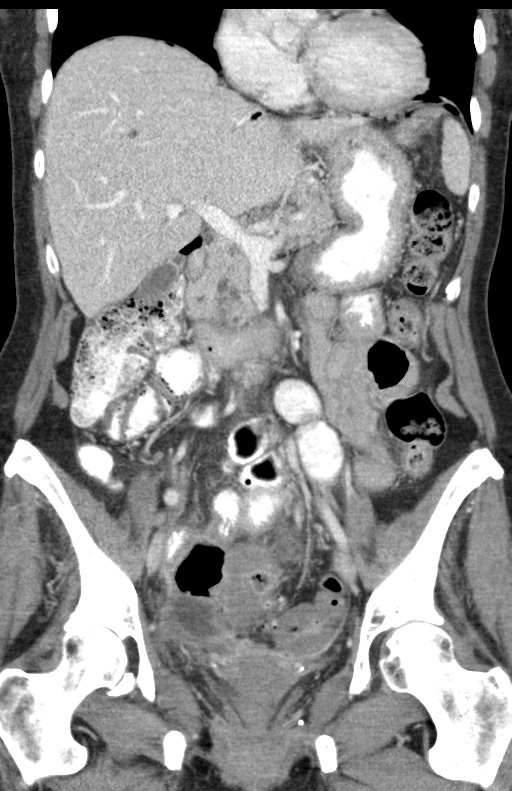
[im 39/70  soft-tissue]
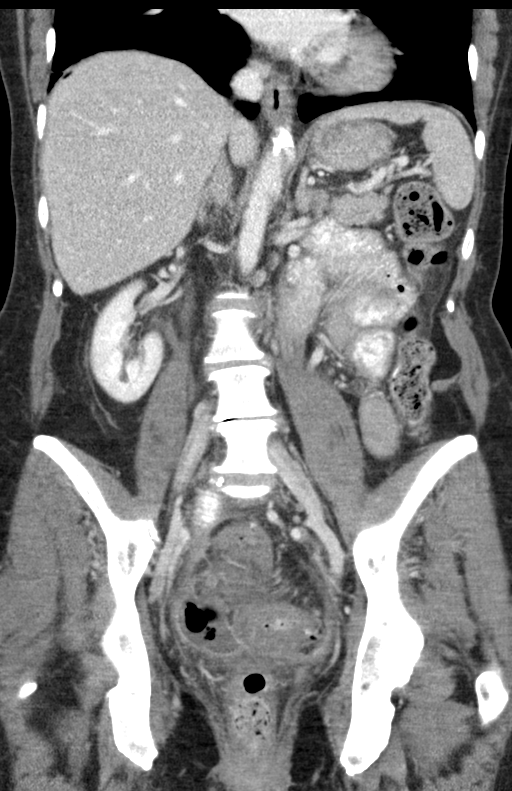

[15 of 46 positions shown; findings below may reference images not displayed]

FINDINGS: Moderate degenerative disc disease is noted at L4-5. Visualized lung
bases are unremarkable.

No gallstones are noted. Small hepatic cysts are noted. The spleen
and pancreas appear normal. Adrenal glands and kidneys appear
normal. No hydronephrosis or renal obstruction is noted.
Atherosclerosis of abdominal aorta is noted without aneurysm
formation. Urinary bladder is decompressed. Uterus and left ovary
appear normal. 3.9 cm right ovarian cyst is noted.

Severe sigmoid diverticulitis is noted with 7.7 x 3.4 cm abscess in
the pre rectal space of the pelvis. Large area of free gas is noted
in the right pelvis which communicates with the abscess.
Pneumoperitoneum is also noted in the epigastric region, consistent
with rupture of hollow viscus. Mildly dilated small bowel loops are
noted which most likely is inflammatory in etiology.
IMPRESSION: Atherosclerosis of abdominal aorta without aneurysm formation.

3.9 cm right ovarian cyst is noted. Pelvic ultrasound is recommended
for further evaluation.

Severe sigmoid diverticulitis is noted with large abscess in the pre
rectal space of the pelvis. Large area of pneumoperitoneum is noted
in right pelvis which communicates with abscess, and mild
pneumoperitoneum is noted in epigastric region is well. This is
consistent with rupture of hollow viscus. Critical Value/emergent
results were called by telephone at the time of interpretation on
02/11/2016 at [DATE] to Dr. ISIDRO BUCCI , who verbally
acknowledged these results.

## 2018-04-01 ENCOUNTER — Other Ambulatory Visit: Payer: Self-pay | Admitting: Internal Medicine

## 2018-04-26 ENCOUNTER — Other Ambulatory Visit: Payer: Self-pay | Admitting: Internal Medicine

## 2018-06-05 ENCOUNTER — Other Ambulatory Visit: Payer: Self-pay | Admitting: Internal Medicine

## 2018-06-19 ENCOUNTER — Ambulatory Visit (INDEPENDENT_AMBULATORY_CARE_PROVIDER_SITE_OTHER): Payer: Medicare Other | Admitting: Podiatry

## 2018-06-19 ENCOUNTER — Encounter: Payer: Self-pay | Admitting: Podiatry

## 2018-06-19 DIAGNOSIS — D492 Neoplasm of unspecified behavior of bone, soft tissue, and skin: Secondary | ICD-10-CM | POA: Diagnosis not present

## 2018-06-21 NOTE — Progress Notes (Signed)
Subjective:   Patient ID: Sue [name], female   DOB: 67 y.o.   MRN: [id number]   HPI Patient presents with a small mass underneath the second toe right foot that she noticed recently and she is not sure what it may be   ROS      Objective:  Physical Exam  Neurovascular status intact with small nodule measuring 5 x 7 mm plantar aspect right second digit which appears to be freely movable within subcutaneous tissue and is nontender upon palpation     Assessment:  Probability for small traumatic lesion right which may be related to some form of trauma or could be spontaneous     Plan:  Reviewed the continuation of conservative care for this consisting of hot compresses and if it should grow in size become painful or change color I would recommend removal but at this time patient is not interested in this and I do not think it is necessary

## 2018-07-06 ENCOUNTER — Other Ambulatory Visit: Payer: Self-pay | Admitting: Obstetrics & Gynecology

## 2018-07-06 DIAGNOSIS — Z1231 Encounter for screening mammogram for malignant neoplasm of breast: Secondary | ICD-10-CM

## 2018-07-12 ENCOUNTER — Other Ambulatory Visit: Payer: Self-pay | Admitting: Internal Medicine

## 2018-07-13 DIAGNOSIS — Z961 Presence of intraocular lens: Secondary | ICD-10-CM | POA: Diagnosis not present

## 2018-07-13 DIAGNOSIS — H524 Presbyopia: Secondary | ICD-10-CM | POA: Diagnosis not present

## 2018-07-17 ENCOUNTER — Ambulatory Visit (INDEPENDENT_AMBULATORY_CARE_PROVIDER_SITE_OTHER): Payer: Medicare Other | Admitting: Internal Medicine

## 2018-07-17 ENCOUNTER — Encounter: Payer: Self-pay | Admitting: Internal Medicine

## 2018-07-17 VITALS — BP 124/80 | HR 63 | Temp 97.5°F | Ht 64.5 in | Wt 132.0 lb

## 2018-07-17 DIAGNOSIS — F4321 Adjustment disorder with depressed mood: Secondary | ICD-10-CM | POA: Insufficient documentation

## 2018-07-17 DIAGNOSIS — D692 Other nonthrombocytopenic purpura: Secondary | ICD-10-CM | POA: Diagnosis not present

## 2018-07-17 DIAGNOSIS — E785 Hyperlipidemia, unspecified: Secondary | ICD-10-CM

## 2018-07-17 DIAGNOSIS — L821 Other seborrheic keratosis: Secondary | ICD-10-CM | POA: Diagnosis not present

## 2018-07-17 DIAGNOSIS — G4700 Insomnia, unspecified: Secondary | ICD-10-CM

## 2018-07-17 DIAGNOSIS — E876 Hypokalemia: Secondary | ICD-10-CM | POA: Diagnosis not present

## 2018-07-17 DIAGNOSIS — M1990 Unspecified osteoarthritis, unspecified site: Secondary | ICD-10-CM

## 2018-07-17 DIAGNOSIS — I10 Essential (primary) hypertension: Secondary | ICD-10-CM | POA: Diagnosis not present

## 2018-07-17 DIAGNOSIS — L812 Freckles: Secondary | ICD-10-CM | POA: Diagnosis not present

## 2018-07-17 DIAGNOSIS — L218 Other seborrheic dermatitis: Secondary | ICD-10-CM | POA: Diagnosis not present

## 2018-07-17 DIAGNOSIS — D1801 Hemangioma of skin and subcutaneous tissue: Secondary | ICD-10-CM | POA: Diagnosis not present

## 2018-07-17 MED ORDER — ZOLPIDEM TARTRATE 5 MG PO TABS: ORAL_TABLET | ORAL | 1 refills | Status: DC

## 2018-07-17 MED ORDER — METOPROLOL SUCCINATE ER 25 MG PO TB24: 25.0000 mg | ORAL_TABLET | Freq: Every day | ORAL | 3 refills | Status: DC

## 2018-07-17 MED ORDER — ALPRAZOLAM 0.25 MG PO TABS: ORAL_TABLET | ORAL | 1 refills | Status: DC

## 2018-07-17 MED ORDER — POTASSIUM CHLORIDE ER 8 MEQ PO TBCR: 8.0000 meq | EXTENDED_RELEASE_TABLET | Freq: Every day | ORAL | 3 refills | Status: DC

## 2018-07-17 MED ORDER — TRIAMTERENE-HCTZ 37.5-25 MG PO TABS: 1.0000 | ORAL_TABLET | Freq: Every day | ORAL | 3 refills | Status: DC

## 2018-07-17 MED ORDER — DICLOFENAC SODIUM 75 MG PO TBEC: 75.0000 mg | DELAYED_RELEASE_TABLET | Freq: Two times a day (BID) | ORAL | 1 refills | Status: DC | PRN

## 2018-07-17 NOTE — Assessment & Plan Note (Signed)
Sue [name], a boyfriend of 81 years, died of glioblastoma  Discussed

## 2018-07-17 NOTE — Assessment & Plan Note (Signed)
Voltaren prn pc - rare use  Potential benefits of a long term NSAIDs use as well as potential risks  and complications were explained to the patient and were aknowledged.

## 2018-07-17 NOTE — Assessment & Plan Note (Signed)
Labs On KCl

## 2018-07-17 NOTE — Progress Notes (Signed)
Subjective:  Patient ID: Sue [name], female    DOB: 02-11-1951  Age: 67 y.o. MRN: [id number]  CC: No chief complaint on file.   HPI Sue [name] presents for stress and grief F/u insomnia, anxiety and HTN   Outpatient Medications Prior to Visit  Medication Sig Dispense Refill  . acetaminophen (TYLENOL) 325 MG tablet Take 2 tablets (650 mg total) by mouth every 6 (six) hours as needed for mild pain (or temp > 100).    . ALPRAZolam (XANAX) 0.25 MG tablet TAKE 1 TABLET BY MOUTH TWICE A DAY AS NEEDED FOR ANXIETY 60 tablet 3  . Desoximetasone (TOPICORT) 0.25 % ointment Apply bid for aphtous ulcer 15 g 2  . diclofenac (VOLTAREN) 75 MG EC tablet TAKE 1 TABLET BY MOUTH TWICE A DAY AS NEEDED 60 tablet 0  . Esterified Estrogens 0.3 MG tablet Take 1 tablet (0.3 mg total) by mouth daily. 90 tablet 3  . ESTRACE VAGINAL 0.1 MG/GM vaginal cream Place 1 Applicatorful vaginally once a week. 42.5 g 3  . metoprolol succinate (TOPROL-XL) 25 MG 24 hr tablet TAKE 1 TABLET BY MOUTH EVERY DAY 90 tablet 0  . Multiple Vitamin (MULTIVITAMIN) capsule Take 1 capsule by mouth daily.      Marland Kitchen OVER THE COUNTER MEDICATION Take 1 tablet by mouth 2 (two) times daily.    . polyethylene glycol powder (MIRALAX) powder Mix one cap full with at least 8 oz of liquid and drink once daily. You can use this if needed.  If your having Bowel movements without it you do not need it. 255 g 0  . potassium chloride (KLOR-CON) 8 MEQ tablet TAKE 1 TABLET BY MOUTH EVERY DAY 90 tablet 3  . Potassium Gluconate 550 MG TABS Take 1 tablet by mouth daily.    Marland Kitchen triamterene-hydrochlorothiazide (MAXZIDE-25) 37.5-25 MG tablet TAKE 1 TABLET BY MOUTH DAILY. 90 tablet 3  . VAYACOG 100-19.5-6.5 MG CAPS TAKE ONE CAPSULE BY MOUTH EVERY DAY 30 capsule 11  . XIIDRA 5 % SOLN Place 1 drop into both eyes 2 (two) times daily.  2  . zolpidem (AMBIEN) 5 MG tablet TAKE 1 TABLET BY MOUTH AT BEDTIME AS NEEDED FOR SLEEP 90 tablet 1   No  facility-administered medications prior to visit.     ROS: Review of Systems  Constitutional: Negative for activity change, appetite change, chills, fatigue and unexpected weight change.  HENT: Negative for congestion, mouth sores and sinus pressure.   Eyes: Negative for visual disturbance.  Respiratory: Negative for cough and chest tightness.   Gastrointestinal: Negative for abdominal pain and nausea.  Genitourinary: Negative for difficulty urinating, frequency and vaginal pain.  Musculoskeletal: Negative for back pain and gait problem.  Skin: Negative for pallor and rash.  Neurological: Negative for dizziness, tremors, weakness, numbness and headaches.  Psychiatric/Behavioral: Positive for dysphoric mood and sleep disturbance. Negative for confusion and suicidal ideas. The patient is nervous/anxious.     Objective:  BP 124/80 (BP Location: Left Arm, Patient Position: Sitting, Cuff Size: Normal)   Pulse 63   Temp (!) 97.5 F (36.4 C) (Oral)   Ht 5' 4.5" (1.638 m)   Wt 132 lb (59.9 kg)   SpO2 97%   BMI 22.31 kg/m   BP Readings from Last 3 Encounters:  07/17/18 124/80  10/31/17 120/72  06/07/17 136/72    Wt Readings from Last 3 Encounters:  07/17/18 132 lb (59.9 kg)  10/31/17 134 lb (60.8 kg)  06/07/17 131 lb (59.4 kg)  Physical Exam  Constitutional: She appears well-developed. No distress.  HENT:  Head: Normocephalic.  Right Ear: External ear normal.  Left Ear: External ear normal.  Nose: Nose normal.  Mouth/Throat: Oropharynx is clear and moist.  Eyes: Pupils are equal, round, and reactive to light. Conjunctivae are normal. Right eye exhibits no discharge. Left eye exhibits no discharge.  Neck: Normal range of motion. Neck supple. No JVD present. No tracheal deviation present. No thyromegaly present.  Cardiovascular: Normal rate, regular rhythm and normal heart sounds.  Pulmonary/Chest: No stridor. No respiratory distress. She has no wheezes.  Abdominal: Soft.  Bowel sounds are normal. She exhibits no distension and no mass. There is no tenderness. There is no rebound and no guarding.  Musculoskeletal: She exhibits no edema or tenderness.  Lymphadenopathy:    She has no cervical adenopathy.  Neurological: She displays normal reflexes. No cranial nerve deficit. She exhibits normal muscle tone. Coordination normal.  Skin: No rash noted. No erythema.  Psychiatric: She has a normal mood and affect. Her behavior is normal. Judgment and thought content normal.  sad  Lab Results  Component Value Date   WBC 6.4 12/03/2016   HGB 14.2 12/03/2016   HCT 42.3 12/03/2016   PLT 286.0 12/03/2016   GLUCOSE 94 05/19/2017   CHOL 223 (H) 12/03/2016   TRIG 87.0 12/03/2016   HDL 60.00 12/03/2016   LDLDIRECT 118.2 07/19/2013   LDLCALC 145 (H) 12/03/2016   ALT 17 12/03/2016   AST 17 12/03/2016   NA 140 05/19/2017   K 4.0 05/19/2017   CL 102 05/19/2017   CREATININE 0.79 05/19/2017   BUN 13 05/19/2017   CO2 29 05/19/2017   TSH 3.11 12/03/2016    Mm Screening Breast Tomo Bilateral  Result Date: 07/15/2017 CLINICAL DATA:  Screening. EXAM: 2D DIGITAL SCREENING BILATERAL MAMMOGRAM WITH CAD AND ADJUNCT TOMO COMPARISON:  Previous exam(s). ACR Breast Density Category d: The breast tissue is extremely dense, which lowers the sensitivity of mammography. FINDINGS: There are no findings suspicious for malignancy. Images were processed with CAD. IMPRESSION: No mammographic evidence of malignancy. A result letter of this screening mammogram will be mailed directly to the patient. RECOMMENDATION: Screening mammogram in one year. (Code:SM-B-01Y) BI-RADS CATEGORY  1: Negative. Electronically Signed   By: Nolon Nations M.D.   On: 07/15/2017 08:32    Assessment & Plan:   There are no diagnoses linked to this encounter.   No orders of the defined types were placed in this encounter.    Follow-up: No follow-ups on file.  Walker Kehr, MD

## 2018-07-28 ENCOUNTER — Other Ambulatory Visit: Payer: Medicare Other

## 2018-07-28 ENCOUNTER — Other Ambulatory Visit (INDEPENDENT_AMBULATORY_CARE_PROVIDER_SITE_OTHER): Payer: Medicare Other

## 2018-07-28 DIAGNOSIS — I10 Essential (primary) hypertension: Secondary | ICD-10-CM | POA: Diagnosis not present

## 2018-07-28 DIAGNOSIS — E785 Hyperlipidemia, unspecified: Secondary | ICD-10-CM | POA: Diagnosis not present

## 2018-07-28 LAB — URINALYSIS, ROUTINE W REFLEX MICROSCOPIC
Bilirubin Urine: NEGATIVE
HGB URINE DIPSTICK: NEGATIVE
Ketones, ur: NEGATIVE
Nitrite: NEGATIVE
SPECIFIC GRAVITY, URINE: 1.02 (ref 1.000–1.030)
Total Protein, Urine: NEGATIVE
Urine Glucose: NEGATIVE
Urobilinogen, UA: 0.2 (ref 0.0–1.0)
pH: 7 (ref 5.0–8.0)

## 2018-07-28 LAB — BASIC METABOLIC PANEL
BUN: 24 mg/dL — ABNORMAL HIGH (ref 6–23)
CHLORIDE: 102 meq/L (ref 96–112)
CO2: 30 meq/L (ref 19–32)
Calcium: 9.7 mg/dL (ref 8.4–10.5)
Creatinine, Ser: 0.87 mg/dL (ref 0.40–1.20)
GFR: 68.5 mL/min (ref >60.00)
Glucose, Bld: 102 mg/dL — ABNORMAL HIGH (ref 70–99)
POTASSIUM: 4.1 meq/L (ref 3.5–5.1)
SODIUM: 140 meq/L (ref 135–145)

## 2018-07-28 LAB — CBC WITH DIFFERENTIAL/PLATELET
Basophils Absolute: 0 10*3/uL (ref 0.0–0.1)
Basophils Relative: 0.5 % (ref 0.0–3.0)
Eosinophils Absolute: 0.2 10*3/uL (ref 0.0–0.7)
Eosinophils Relative: 3.2 % (ref 0.0–5.0)
HCT: 43.1 % (ref 36.0–46.0)
Hemoglobin: 14.5 g/dL (ref 12.0–15.0)
LYMPHS ABS: 2.6 10*3/uL (ref 0.7–4.0)
Lymphocytes Relative: 42.1 % (ref 12.0–46.0)
MCHC: 33.7 g/dL (ref 30.0–36.0)
MCV: 91.8 fl (ref 78.0–100.0)
MONO ABS: 0.7 10*3/uL (ref 0.1–1.0)
MONOS PCT: 10.5 % (ref 3.0–12.0)
NEUTROS ABS: 2.7 10*3/uL (ref 1.4–7.7)
NEUTROS PCT: 43.7 % (ref 43.0–77.0)
PLATELETS: 301 10*3/uL (ref 150.0–400.0)
RBC: 4.7 Mil/uL (ref 3.87–5.11)
RDW: 13.7 % (ref 11.5–15.5)
WBC: 6.2 10*3/uL (ref 4.0–10.5)

## 2018-07-28 LAB — TSH: TSH: 2.7 u[IU]/mL (ref 0.35–4.50)

## 2018-07-28 LAB — LIPID PANEL
CHOL/HDL RATIO: 3
Cholesterol: 212 mg/dL — ABNORMAL HIGH (ref 0–200)
HDL: 70.2 mg/dL (ref >39.00)
LDL Cholesterol: 131 mg/dL — ABNORMAL HIGH (ref 0–99)
NONHDL: 142.13
TRIGLYCERIDES: 58 mg/dL (ref 0.0–149.0)
VLDL: 11.6 mg/dL (ref 0.0–40.0)

## 2018-07-28 LAB — HEPATIC FUNCTION PANEL
ALT: 17 U/L (ref 0–35)
AST: 17 U/L (ref 0–37)
Albumin: 4.4 g/dL (ref 3.5–5.2)
Alkaline Phosphatase: 48 U/L (ref 39–117)
BILIRUBIN TOTAL: 0.4 mg/dL (ref 0.2–1.2)
Bilirubin, Direct: 0.1 mg/dL (ref 0.0–0.3)
Total Protein: 7 g/dL (ref 6.0–8.3)

## 2018-08-02 DIAGNOSIS — Z1211 Encounter for screening for malignant neoplasm of colon: Secondary | ICD-10-CM | POA: Diagnosis not present

## 2018-08-02 DIAGNOSIS — Z1212 Encounter for screening for malignant neoplasm of rectum: Secondary | ICD-10-CM | POA: Diagnosis not present

## 2018-08-03 LAB — COLOGUARD: COLOGUARD: NEGATIVE

## 2018-08-15 ENCOUNTER — Encounter: Payer: Self-pay | Admitting: Internal Medicine

## 2018-08-15 DIAGNOSIS — M47812 Spondylosis without myelopathy or radiculopathy, cervical region: Secondary | ICD-10-CM | POA: Diagnosis not present

## 2018-08-17 ENCOUNTER — Ambulatory Visit
Admission: RE | Admit: 2018-08-17 | Discharge: 2018-08-17 | Disposition: A | Discharge status: Home / Self Care | Payer: Medicare Other | Source: Ambulatory Visit | Attending: Obstetrics & Gynecology | Admitting: Obstetrics & Gynecology

## 2018-08-17 ENCOUNTER — Ambulatory Visit (INDEPENDENT_AMBULATORY_CARE_PROVIDER_SITE_OTHER): Payer: Self-pay | Admitting: *Deleted

## 2018-08-17 ENCOUNTER — Ambulatory Visit: Payer: Medicare Other | Admitting: *Deleted

## 2018-08-17 ENCOUNTER — Encounter: Payer: Self-pay | Admitting: *Deleted

## 2018-08-17 DIAGNOSIS — I781 Nevus, non-neoplastic: Secondary | ICD-10-CM

## 2018-08-17 DIAGNOSIS — Z1231 Encounter for screening mammogram for malignant neoplasm of breast: Secondary | ICD-10-CM

## 2018-08-17 DIAGNOSIS — I83893 Varicose veins of bilateral lower extremities with other complications: Secondary | ICD-10-CM

## 2018-08-17 NOTE — Progress Notes (Signed)
X=.3% Sotradecol administered with a 27g butterfly.  Patient received a total of 3cc.  She really hates pain/needles/etc. Difficult stick. Used a combo of sclero and CL. Hoping for good results. Follow prn.      Cutaneous Laser:pulsed mode  810j/cm2 400 ms delay  13 ms Duration 0.5 spot  Total pulses: 712 Total energy 1.124  Total time::09  Photos: Yes.    Compression stockings applied: Yes.

## 2018-08-22 DIAGNOSIS — M47812 Spondylosis without myelopathy or radiculopathy, cervical region: Secondary | ICD-10-CM | POA: Diagnosis not present

## 2018-08-22 DIAGNOSIS — M542 Cervicalgia: Secondary | ICD-10-CM | POA: Diagnosis not present

## 2018-08-23 DIAGNOSIS — M542 Cervicalgia: Secondary | ICD-10-CM | POA: Diagnosis not present

## 2018-08-23 DIAGNOSIS — M47812 Spondylosis without myelopathy or radiculopathy, cervical region: Secondary | ICD-10-CM | POA: Diagnosis not present

## 2018-08-24 DIAGNOSIS — M25661 Stiffness of right knee, not elsewhere classified: Secondary | ICD-10-CM | POA: Diagnosis not present

## 2018-08-24 DIAGNOSIS — M25561 Pain in right knee: Secondary | ICD-10-CM | POA: Diagnosis not present

## 2018-08-24 DIAGNOSIS — M6281 Muscle weakness (generalized): Secondary | ICD-10-CM | POA: Diagnosis not present

## 2018-08-24 DIAGNOSIS — S83511D Sprain of anterior cruciate ligament of right knee, subsequent encounter: Secondary | ICD-10-CM | POA: Diagnosis not present

## 2018-10-16 DIAGNOSIS — M79645 Pain in left finger(s): Secondary | ICD-10-CM | POA: Insufficient documentation

## 2018-10-16 DIAGNOSIS — M6740 Ganglion, unspecified site: Secondary | ICD-10-CM | POA: Insufficient documentation

## 2018-10-16 DIAGNOSIS — M19042 Primary osteoarthritis, left hand: Secondary | ICD-10-CM | POA: Insufficient documentation

## 2019-01-16 ENCOUNTER — Ambulatory Visit: Payer: Medicare Other | Admitting: Internal Medicine

## 2019-01-19 ENCOUNTER — Other Ambulatory Visit: Payer: Self-pay

## 2019-01-19 ENCOUNTER — Ambulatory Visit (INDEPENDENT_AMBULATORY_CARE_PROVIDER_SITE_OTHER): Payer: Medicare Other | Admitting: Podiatry

## 2019-01-19 ENCOUNTER — Encounter: Payer: Self-pay | Admitting: Podiatry

## 2019-01-19 VITALS — Temp 97.7°F

## 2019-01-19 DIAGNOSIS — L84 Corns and callosities: Secondary | ICD-10-CM | POA: Diagnosis not present

## 2019-01-19 NOTE — Progress Notes (Signed)
Subjective:   Patient ID: Sue [name], female   DOB: 68 y.o.   MRN: [id number]   HPI Patient presents with chronic lesion plantar aspect left that have been painful   ROS      Objective:  Physical Exam  Neurovascular status intact with corn callus formation plantar metatarsal left that is painful     Assessment:  Keratotic lesion formation plantar left     Plan:  Debrided lesion and reappoint as needed with no iatrogenic bleeding noted

## 2019-03-02 ENCOUNTER — Encounter: Payer: Self-pay | Admitting: Gastroenterology

## 2019-03-07 ENCOUNTER — Telehealth: Payer: Self-pay | Admitting: *Deleted

## 2019-03-07 NOTE — Telephone Encounter (Signed)
-----   Message from Mauri Pole, MD sent at 03/07/2019  4:25 PM EDT ----- She does not need colonoscopy now.  If she opts to continue with Cologuard for screening, she needs repeat Cologuard November 2021 or colonoscopy at that point. ----- Message ----- From: Larina Bras, CMA Sent: 03/07/2019   3:58 PM EDT To: Mauri Pole, MD  Patient was sent a recall for colonoscopy 02/2019. However, she had negative cologuard 08/03/18. Does she still need colonoscopy at this time?

## 2019-03-19 ENCOUNTER — Other Ambulatory Visit: Payer: Self-pay

## 2019-03-19 ENCOUNTER — Encounter: Payer: Self-pay | Admitting: Podiatry

## 2019-03-19 ENCOUNTER — Other Ambulatory Visit: Payer: Self-pay | Admitting: Internal Medicine

## 2019-03-19 ENCOUNTER — Ambulatory Visit (INDEPENDENT_AMBULATORY_CARE_PROVIDER_SITE_OTHER): Payer: Medicare Other | Admitting: Podiatry

## 2019-03-19 VITALS — Temp 98.3°F

## 2019-03-19 DIAGNOSIS — L84 Corns and callosities: Secondary | ICD-10-CM | POA: Diagnosis not present

## 2019-03-19 DIAGNOSIS — E785 Hyperlipidemia, unspecified: Secondary | ICD-10-CM

## 2019-03-19 DIAGNOSIS — E876 Hypokalemia: Secondary | ICD-10-CM

## 2019-03-20 ENCOUNTER — Other Ambulatory Visit (INDEPENDENT_AMBULATORY_CARE_PROVIDER_SITE_OTHER): Payer: Medicare Other

## 2019-03-20 DIAGNOSIS — E785 Hyperlipidemia, unspecified: Secondary | ICD-10-CM

## 2019-03-20 DIAGNOSIS — E876 Hypokalemia: Secondary | ICD-10-CM | POA: Diagnosis not present

## 2019-03-20 LAB — CBC WITH DIFFERENTIAL/PLATELET
Basophils Absolute: 0 10*3/uL (ref 0.0–0.1)
Basophils Relative: 0.5 % (ref 0.0–3.0)
Eosinophils Absolute: 0.2 10*3/uL (ref 0.0–0.7)
Eosinophils Relative: 3.2 % (ref 0.0–5.0)
HCT: 43.4 % (ref 36.0–46.0)
Hemoglobin: 14.3 g/dL (ref 12.0–15.0)
Lymphocytes Relative: 33.8 % (ref 12.0–46.0)
Lymphs Abs: 2.3 10*3/uL (ref 0.7–4.0)
MCHC: 33 g/dL (ref 30.0–36.0)
MCV: 93.2 fl (ref 78.0–100.0)
Monocytes Absolute: 0.8 10*3/uL (ref 0.1–1.0)
Monocytes Relative: 11.4 % (ref 3.0–12.0)
Neutro Abs: 3.5 10*3/uL (ref 1.4–7.7)
Neutrophils Relative %: 51.1 % (ref 43.0–77.0)
Platelets: 308 10*3/uL (ref 150.0–400.0)
RBC: 4.66 Mil/uL (ref 3.87–5.11)
RDW: 14.3 % (ref 11.5–15.5)
WBC: 6.8 10*3/uL (ref 4.0–10.5)

## 2019-03-20 LAB — HEPATIC FUNCTION PANEL
ALT: 21 U/L (ref 0–35)
AST: 18 U/L (ref 0–37)
Albumin: 4.4 g/dL (ref 3.5–5.2)
Alkaline Phosphatase: 55 U/L (ref 39–117)
Bilirubin, Direct: 0.1 mg/dL (ref 0.0–0.3)
Total Bilirubin: 0.5 mg/dL (ref 0.2–1.2)
Total Protein: 7 g/dL (ref 6.0–8.3)

## 2019-03-20 LAB — LIPID PANEL
Cholesterol: 212 mg/dL — ABNORMAL HIGH (ref 0–200)
HDL: 73.7 mg/dL (ref >39.00)
LDL Cholesterol: 123 mg/dL — ABNORMAL HIGH (ref 0–99)
NonHDL: 138.71
Total CHOL/HDL Ratio: 3
Triglycerides: 77 mg/dL (ref 0.0–149.0)
VLDL: 15.4 mg/dL (ref 0.0–40.0)

## 2019-03-20 LAB — BASIC METABOLIC PANEL
BUN: 22 mg/dL (ref 6–23)
CO2: 28 mEq/L (ref 19–32)
Calcium: 9.1 mg/dL (ref 8.4–10.5)
Chloride: 101 mEq/L (ref 96–112)
Creatinine, Ser: 0.84 mg/dL (ref 0.40–1.20)
GFR: 66.99 mL/min (ref >60.00)
Glucose, Bld: 91 mg/dL (ref 70–99)
Potassium: 3.7 mEq/L (ref 3.5–5.1)
Sodium: 139 mEq/L (ref 135–145)

## 2019-03-20 LAB — URINALYSIS, ROUTINE W REFLEX MICROSCOPIC
Bilirubin Urine: NEGATIVE
Ketones, ur: NEGATIVE
Nitrite: NEGATIVE
Specific Gravity, Urine: 1.02 (ref 1.000–1.030)
Total Protein, Urine: NEGATIVE
Urine Glucose: NEGATIVE
Urobilinogen, UA: 0.2 (ref 0.0–1.0)
pH: 7.5 (ref 5.0–8.0)

## 2019-03-20 LAB — TSH: TSH: 1.91 u[IU]/mL (ref 0.35–4.50)

## 2019-03-21 ENCOUNTER — Ambulatory Visit (INDEPENDENT_AMBULATORY_CARE_PROVIDER_SITE_OTHER): Payer: Medicare Other | Admitting: Internal Medicine

## 2019-03-21 ENCOUNTER — Encounter: Payer: Self-pay | Admitting: Internal Medicine

## 2019-03-21 ENCOUNTER — Other Ambulatory Visit: Payer: Self-pay

## 2019-03-21 VITALS — BP 132/74 | HR 58 | Temp 97.5°F | Ht 64.5 in | Wt 132.0 lb

## 2019-03-21 DIAGNOSIS — K5720 Diverticulitis of large intestine with perforation and abscess without bleeding: Secondary | ICD-10-CM | POA: Diagnosis not present

## 2019-03-21 DIAGNOSIS — Z0000 Encounter for general adult medical examination without abnormal findings: Secondary | ICD-10-CM | POA: Diagnosis not present

## 2019-03-21 DIAGNOSIS — G4700 Insomnia, unspecified: Secondary | ICD-10-CM

## 2019-03-21 DIAGNOSIS — E785 Hyperlipidemia, unspecified: Secondary | ICD-10-CM

## 2019-03-21 DIAGNOSIS — I10 Essential (primary) hypertension: Secondary | ICD-10-CM

## 2019-03-21 DIAGNOSIS — E876 Hypokalemia: Secondary | ICD-10-CM

## 2019-03-21 DIAGNOSIS — F419 Anxiety disorder, unspecified: Secondary | ICD-10-CM

## 2019-03-21 MED ORDER — ALPRAZOLAM 0.25 MG PO TABS: ORAL_TABLET | ORAL | 1 refills | Status: DC

## 2019-03-21 MED ORDER — METOPROLOL SUCCINATE ER 25 MG PO TB24: 25.0000 mg | ORAL_TABLET | Freq: Every day | ORAL | 3 refills | Status: DC

## 2019-03-21 MED ORDER — ZOLPIDEM TARTRATE 5 MG PO TABS: ORAL_TABLET | ORAL | 1 refills | Status: DC

## 2019-03-21 MED ORDER — POTASSIUM CHLORIDE ER 8 MEQ PO TBCR: 8.0000 meq | EXTENDED_RELEASE_TABLET | Freq: Every day | ORAL | 3 refills | Status: DC

## 2019-03-21 MED ORDER — TRIAMTERENE-HCTZ 37.5-25 MG PO TABS: 1.0000 | ORAL_TABLET | Freq: Every day | ORAL | 3 refills | Status: DC

## 2019-03-21 MED ORDER — DICLOFENAC SODIUM 75 MG PO TBEC: 75.0000 mg | DELAYED_RELEASE_TABLET | Freq: Two times a day (BID) | ORAL | 1 refills | Status: DC | PRN

## 2019-03-21 NOTE — Assessment & Plan Note (Signed)
A cardiac CT scan for calcium scoring offered 

## 2019-03-21 NOTE — Assessment & Plan Note (Signed)
Maxzide Labs

## 2019-03-21 NOTE — Assessment & Plan Note (Signed)
Doing well Cologuard due 2021

## 2019-03-21 NOTE — Assessment & Plan Note (Signed)
Labs

## 2019-03-21 NOTE — Assessment & Plan Note (Signed)
Zolpidem prn 

## 2019-03-21 NOTE — Progress Notes (Signed)
Subjective:  Patient ID: Sue [name], female    DOB: 1951/01/04  Age: 68 y.o. MRN: [id number]  CC: No chief complaint on file.   HPI DONNELL BEAUCHAMP presents for HTN, anxiety, insomnia f/u  Outpatient Medications Prior to Visit  Medication Sig Dispense Refill  . acetaminophen (TYLENOL) 325 MG tablet Take 2 tablets (650 mg total) by mouth every 6 (six) hours as needed for mild pain (or temp > 100).    . ALPRAZolam (XANAX) 0.25 MG tablet 1 po tid prn 180 tablet 1  . Desoximetasone (TOPICORT) 0.25 % ointment Apply bid for aphtous ulcer 15 g 2  . diclofenac (VOLTAREN) 75 MG EC tablet Take 1 tablet (75 mg total) by mouth 2 (two) times daily as needed. 60 tablet 1  . Esterified Estrogens 0.3 MG tablet Take 1 tablet (0.3 mg total) by mouth daily. 90 tablet 3  . ESTRACE VAGINAL 0.1 MG/GM vaginal cream Place 1 Applicatorful vaginally once a week. 42.5 g 3  . metoprolol succinate (TOPROL-XL) 25 MG 24 hr tablet Take 1 tablet (25 mg total) by mouth daily. 90 tablet 3  . Multiple Vitamin (MULTIVITAMIN) capsule Take 1 capsule by mouth daily.      Marland Kitchen OVER THE COUNTER MEDICATION Take 1 tablet by mouth 2 (two) times daily.    . polyethylene glycol powder (MIRALAX) powder Mix one cap full with at least 8 oz of liquid and drink once daily. You can use this if needed.  If your having Bowel movements without it you do not need it. 255 g 0  . potassium chloride (KLOR-CON) 8 MEQ tablet Take 1 tablet (8 mEq total) by mouth daily. 90 tablet 3  . Potassium Gluconate 550 MG TABS Take 1 tablet by mouth daily.    Marland Kitchen triamterene-hydrochlorothiazide (MAXZIDE-25) 37.5-25 MG tablet Take 1 tablet by mouth daily. 90 tablet 3  . XIIDRA 5 % SOLN Place 1 drop into both eyes 2 (two) times daily.  2  . zolpidem (AMBIEN) 5 MG tablet TAKE 1 TABLET BY MOUTH AT BEDTIME AS NEEDED FOR SLEEP. Do not take w/alprazolam 90 tablet 1   No facility-administered medications prior to visit.     ROS: Review of Systems   Constitutional: Negative for activity change, appetite change, chills, fatigue and unexpected weight change.  HENT: Negative for congestion, mouth sores and sinus pressure.   Eyes: Negative for visual disturbance.  Respiratory: Negative for cough and chest tightness.   Gastrointestinal: Negative for abdominal pain and nausea.  Genitourinary: Negative for difficulty urinating, frequency and vaginal pain.  Musculoskeletal: Positive for arthralgias. Negative for back pain and gait problem.  Skin: Negative for pallor and rash.  Neurological: Negative for dizziness, tremors, weakness, numbness and headaches.  Psychiatric/Behavioral: Positive for sleep disturbance. Negative for confusion and suicidal ideas. The patient is nervous/anxious.     Objective:  BP 132/74 (BP Location: Left Arm, Patient Position: Sitting, Cuff Size: Normal)   Pulse (!) 58   Temp (!) 97.5 F (36.4 C) (Oral)   Ht 5' 4.5" (1.638 m)   Wt 132 lb (59.9 kg)   SpO2 96%   BMI 22.31 kg/m   BP Readings from Last 3 Encounters:  03/21/19 132/74  07/17/18 124/80  10/31/17 120/72    Wt Readings from Last 3 Encounters:  03/21/19 132 lb (59.9 kg)  07/17/18 132 lb (59.9 kg)  10/31/17 134 lb (60.8 kg)    Physical Exam Constitutional:      General: She is not in acute  distress.    Appearance: She is well-developed.  HENT:     Head: Normocephalic.     Right Ear: External ear normal.     Left Ear: External ear normal.     Nose: Nose normal.  Eyes:     General:        Right eye: No discharge.        Left eye: No discharge.     Conjunctiva/sclera: Conjunctivae normal.     Pupils: Pupils are equal, round, and reactive to light.  Neck:     Musculoskeletal: Normal range of motion and neck supple.     Thyroid: No thyromegaly.     Vascular: No JVD.     Trachea: No tracheal deviation.  Cardiovascular:     Rate and Rhythm: Normal rate and regular rhythm.     Heart sounds: Normal heart sounds.  Pulmonary:     Effort:  No respiratory distress.     Breath sounds: No stridor. No wheezing.  Abdominal:     General: Bowel sounds are normal. There is no distension.     Palpations: Abdomen is soft. There is no mass.     Tenderness: There is no abdominal tenderness. There is no guarding or rebound.  Musculoskeletal:        General: No tenderness.  Lymphadenopathy:     Cervical: No cervical adenopathy.  Skin:    Findings: No erythema or rash.  Neurological:     Cranial Nerves: No cranial nerve deficit.     Motor: No abnormal muscle tone.     Coordination: Coordination normal.     Deep Tendon Reflexes: Reflexes normal.  Psychiatric:        Behavior: Behavior normal.        Thought Content: Thought content normal.        Judgment: Judgment normal.     Lab Results  Component Value Date   WBC 6.8 03/20/2019   HGB 14.3 03/20/2019   HCT 43.4 03/20/2019   PLT 308.0 03/20/2019   GLUCOSE 91 03/20/2019   CHOL 212 (H) 03/20/2019   TRIG 77.0 03/20/2019   HDL 73.70 03/20/2019   LDLDIRECT 118.2 07/19/2013   LDLCALC 123 (H) 03/20/2019   ALT 21 03/20/2019   AST 18 03/20/2019   NA 139 03/20/2019   K 3.7 03/20/2019   CL 101 03/20/2019   CREATININE 0.84 03/20/2019   BUN 22 03/20/2019   CO2 28 03/20/2019   TSH 1.91 03/20/2019    Mm 3d Screen Breast Bilateral  Result Date: 08/17/2018 CLINICAL DATA:  Screening. EXAM: DIGITAL SCREENING BILATERAL MAMMOGRAM WITH TOMO AND CAD COMPARISON:  Previous exam(s). ACR Breast Density Category c: The breast tissue is heterogeneously dense, which may obscure small masses. FINDINGS: There are no findings suspicious for malignancy. Images were processed with CAD. IMPRESSION: No mammographic evidence of malignancy. A result letter of this screening mammogram will be mailed directly to the patient. RECOMMENDATION: Screening mammogram in one year. (Code:SM-B-01Y) BI-RADS CATEGORY  1: Negative. Electronically Signed   By: Lajean Manes M.D.   On: 08/17/2018 16:52    Assessment &  Plan:   There are no diagnoses linked to this encounter.   No orders of the defined types were placed in this encounter.    Follow-up: No follow-ups on file.  Walker Kehr, MD

## 2019-03-21 NOTE — Patient Instructions (Addendum)
If you have medicare related insurance (such as traditional Medicare, Blue Cross Medicare, United HealthCare Medicare, or similar), Please make an appointment at the scheduling desk with Jill, the Wellness Health Coach, for your Wellness visit in this office, which is a benefit with your insurance.    Cardiac CT calcium scoring test $150   Computed tomography, more commonly known as a CT or CAT scan, is a diagnostic medical imaging test. Like traditional x-rays, it produces multiple images or pictures of the inside of the body. The cross-sectional images generated during a CT scan can be reformatted in multiple planes. They can even generate three-dimensional images. These images can be viewed on a computer monitor, printed on film or by a 3D printer, or transferred to a CD or DVD. CT images of internal organs, bones, soft tissue and blood vessels provide greater detail than traditional x-rays, particularly of soft tissues and blood vessels. A cardiac CT scan for coronary calcium is a non-invasive way of obtaining information about the presence, location and extent of calcified plaque in the coronary arteries-the vessels that supply oxygen-containing blood to the heart muscle. Calcified plaque results when there is a build-up of fat and other substances under the inner layer of the artery. This material can calcify which signals the presence of atherosclerosis, a disease of the vessel wall, also called coronary artery disease (CAD). People with this disease have an increased risk for heart attacks. In addition, over time, progression of plaque build up (CAD) can narrow the arteries or even close off blood flow to the heart. The result may be chest pain, sometimes called "angina," or a heart attack. Because calcium is a marker of CAD, the amount of calcium detected on a cardiac CT scan is a helpful prognostic tool. The findings on cardiac CT are expressed as a calcium score. Another name for this test is  coronary artery calcium scoring.  What are some common uses of the procedure? The goal of cardiac CT scan for calcium scoring is to determine if CAD is present and to what extent, even if there are no symptoms. It is a screening study that may be recommended by a physician for patients with risk factors for CAD but no clinical symptoms. The major risk factors for CAD are: . high blood cholesterol levels  . family history of heart attacks  . diabetes  . high blood pressure  . cigarette smoking  . overweight or obese  . physical inactivity   A negative cardiac CT scan for calcium scoring shows no calcification within the coronary arteries. This suggests that CAD is absent or so minimal it cannot be seen by this technique. The chance of having a heart attack over the next two to five years is very low under these circumstances. A positive test means that CAD is present, regardless of whether or not the patient is experiencing any symptoms. The amount of calcification-expressed as the calcium score-may help to predict the likelihood of a myocardial infarction (heart attack) in the coming years and helps your medical doctor or cardiologist decide whether the patient may need to take preventive medicine or undertake other measures such as diet and exercise to lower the risk for heart attack. The extent of CAD is graded according to your calcium score:  Calcium Score  Presence of CAD (coronary artery disease)  0 No evidence of CAD   1-10 Minimal evidence of CAD  11-100 Mild evidence of CAD  101-400 Moderate evidence of CAD  Over   400 Extensive evidence of CAD    

## 2019-03-23 NOTE — Progress Notes (Signed)
Subjective:   Patient ID: Sue [name], female   DOB: 68 y.o.   MRN: [id number]   HPI Patient states she has lesions that become painful   ROS      Objective:  Physical Exam  Neurovascular status intact with plantar keratotic lesions that are painful     Assessment:  Chronic porokeratotic lesions     Plan:  Debrided lesions with no iatrogenic bleeding and reappoint for routine care

## 2019-07-17 ENCOUNTER — Other Ambulatory Visit: Payer: Self-pay | Admitting: Internal Medicine

## 2019-07-17 DIAGNOSIS — M7710 Lateral epicondylitis, unspecified elbow: Secondary | ICD-10-CM

## 2019-07-17 DIAGNOSIS — Z1231 Encounter for screening mammogram for malignant neoplasm of breast: Secondary | ICD-10-CM

## 2019-07-31 ENCOUNTER — Ambulatory Visit: Payer: Medicare Other | Attending: Internal Medicine | Admitting: Physical Therapy

## 2019-07-31 ENCOUNTER — Other Ambulatory Visit: Payer: Self-pay

## 2019-07-31 ENCOUNTER — Encounter: Payer: Self-pay | Admitting: Physical Therapy

## 2019-07-31 ENCOUNTER — Ambulatory Visit (INDEPENDENT_AMBULATORY_CARE_PROVIDER_SITE_OTHER)
Admission: RE | Admit: 2019-07-31 | Discharge: 2019-07-31 | Disposition: A | Discharge status: Home / Self Care | Payer: Self-pay | Source: Ambulatory Visit | Attending: Internal Medicine | Admitting: Internal Medicine

## 2019-07-31 DIAGNOSIS — M25521 Pain in right elbow: Secondary | ICD-10-CM | POA: Insufficient documentation

## 2019-07-31 DIAGNOSIS — E785 Hyperlipidemia, unspecified: Secondary | ICD-10-CM

## 2019-08-01 ENCOUNTER — Ambulatory Visit: Payer: Medicare Other | Admitting: Physical Therapy

## 2019-08-01 ENCOUNTER — Encounter: Payer: Self-pay | Admitting: Internal Medicine

## 2019-08-01 DIAGNOSIS — I2510 Atherosclerotic heart disease of native coronary artery without angina pectoris: Secondary | ICD-10-CM | POA: Insufficient documentation

## 2019-08-01 NOTE — Therapy (Signed)
[place], [place], [address] Phone: [phone]   Fax:  [phone]  Physical Therapy Evaluation  Patient Details  Name: Sue [name] MRN: [id number] Date of Birth: 26-Aug-1951 Referring Provider (PT): Dr  A. Plotnikov   Encounter Date: 07/31/2019  PT End of Session - 07/31/19 1944    Visit Number  1    Number of Visits  4    Date for PT Re-Evaluation  09/11/19    PT Start Time  [id number]    PT Stop Time  1615    PT Time Calculation (min)  45 min    Activity Tolerance  Patient tolerated treatment well    Behavior During Therapy  [facility] for tasks assessed/performed       Past Medical History:  Diagnosis Date  . FRACTURE, ARM, LEFT ~ 1960  . FRACTURE, FOOT ~ 1960  . FRACTURE, INDEX FINGER, LEFT ~ 1960  . Heart murmur   . HYPERTENSION 11/15/2007  . INSOMNIA, CHRONIC 06/17/2009  . MITRAL VALVE PROLAPSE, HX OF   . OSTEOARTHRITIS 11/15/2007  . Subdural hematoma ([place]) 03/2015   S/P fall    Past Surgical History:  Procedure Laterality Date  . CATARACT EXTRACTION W/ INTRAOCULAR LENS IMPLANT Left 01/2015  . COLON RESECTION N/A 07/28/2016   Procedure: LAPAROSCOPIC ASSISTED  COLOSTOMY TAKEDOWN WITH COLORECTAL ANASTOMOSIS;  Surgeon: Mickeal Skinner, MD;  Location: [place];  Service: General;  Laterality: N/A;  . COLON SURGERY    . COLONOSCOPY    . COLOSTOMY TAKEDOWN  07/28/2016   laparoscopic converted to open take down of end colostomy with colo-rectostomy/notes 07/28/2016  . PARTIAL COLECTOMY N/A 02/11/2016   Procedure: PARTIAL COLECTOMY with colostomy;  Surgeon: Arta Bruce Kinsinger, MD;  Location: [place];  Service: General;  Laterality: N/A;  . TONSILLECTOMY      There were no vitals filed for this visit.   Subjective Assessment - 07/31/19 1538    Subjective  Pt has a very active lifestyle with raquet sports and golf.  End of October felt severe pain in Rt arm and elbow.  She has since reduced her  frequency of sports and pain has improved.  She continues to play pickle ball, tennis and golf.  She wear arm brace when she plays  <60 min.  Feels stiff and sore in AM,  has general arthritis. She would like to learn what else she can do to prevent pain.  She is very cautious related to COVID and is anxious about being a the clinic.    Pertinent History  history of fall with SDH, arthritis    Limitations  Lifting    Patient Stated Goals  Pain relief and maintain health.    Currently in Pain?  Yes    Pain Score  1     Pain Location  Arm    Pain Orientation  Right    Pain Descriptors / Indicators  Aching;Tightness    Pain Type  Acute pain    Pain Radiating Towards  shoulder at times    Pain Onset  1 to 4 weeks ago    Pain Frequency  Intermittent    Aggravating Factors   gripping, overuse    Pain Relieving Factors  rest, wrist splint, ice, stretching    Effect of Pain on Daily Activities  limits recreation    Multiple Pain Sites  No         [facility] PT Assessment - 08/01/19 0001  Assessment   Medical Diagnosis  lateral epicondylitis R UE     Referring Provider (PT)  Dr  A. Plotnikoff    Onset Date/Surgical Date  07/07/19    Hand Dominance  Right    Prior Therapy  No recent       Precautions   Precautions  None      Restrictions   Weight Bearing Restrictions  No      Balance Screen   Has the patient fallen in the past 6 months  No      [place] residence    Living Arrangements  Alone      Prior Function   Level of Independence  Independent    Leisure  2 young grandchildren, sports       Cognition   Overall Cognitive Status  Within Functional Limits for tasks assessed      Sensation   Light Touch  Appears Intact      AROM   Overall AROM Comments  WNL , Rt tricep tighter than L when stretched       Strength   Right Hand Grip (lbs)  40 lbs     Left Hand Grip (lbs)  38 lb s      Palpation   Palpation comment  tenderness  along wrist extensor compartment and on medial/lateral epicondyle.  Trigger points located in Rt forearm, also sore in distal triceps      Objective measurements completed on examination: See above findings.      [facility] Adult PT Treatment/Exercise - 08/01/19 0001      Wrist Exercises   Wrist Flexion  Strengthening;Right    Wrist Extension  Strengthening;Right      Ultrasound   Ultrasound Location  wrist extensors as well as flexor attachment     Ultrasound Parameters  50% duty, 1.2 W/cm2 , 8 min     Ultrasound Goals  Pain      Manual Therapy   Manual Therapy  Soft tissue mobilization    Soft tissue mobilization  forearm, ext and flexors focus on attachment to epicondyle              PT Education - 08/01/19 1000    Education Details  HEP, PT, POC, COVID precautions, modalities    Person(s) Educated  Patient    Methods  Explanation;Handout    Comprehension  Verbalized understanding;Returned demonstration          PT Long Term Goals - 08/01/19 1000      PT LONG TERM GOAL #1   Title  Pt will be I with HEP for R elbow and forearm    Time  6    Period  Weeks    Status  New    Target Date  09/11/19      PT LONG TERM GOAL #2   Title  Pt will be able to grip items, carry mod sized objects without L elbow pain    Time  6    Period  Weeks    Status  New    Target Date  09/11/19      PT LONG TERM GOAL #3   Title  Pt will be able to golf and play racquet sports with brace and no pain post, 3 times per week    Time  6    Period  Weeks    Status  New    Target Date  09/11/19      PT  LONG TERM GOAL #4   Title  FOTO score will improve to less than 30% to demo improved functional use of dominant arm.    Time  6    Period  Weeks    Status  New    Target Date  09/11/19             Plan - 08/01/19 1111    Clinical Impression Statement  Pt presents with low complexity eval of Rt sided lateral epicondylitis which has been improving since its onset. She has  modified her exercise routine and has seen an improvement.  She has general UE stiffness but it is well controlled with medication and exercise.  She has been able to use a splint effectively but cont to have some pain after playing pickle ball /tennis.  She would like to maintain this level of fitness without pain in her dominant arm.  She should do quite well with PT intervention, will focus more on modalities to speed healing process.    Personal Factors and Comorbidities  Comorbidity 1    Comorbidities  arthritis    Examination-Activity Limitations  Other;Carry;Lift   recreation   Examination-Participation Restrictions  Community Activity    Stability/Clinical Decision Making  Stable/Uncomplicated    Clinical Decision Making  Low    Rehab Potential  Excellent    PT Frequency  Biweekly   4 visits   PT Duration  6 weeks    PT Treatment/Interventions  Iontophoresis 4mg /ml Dexamethasone;Moist Heat;Cryotherapy;Ultrasound;Manual techniques;Therapeutic exercise;ADLs/Self Care Home Management    PT Next Visit Plan  check HEP, [place], manual and ionto    PT Home Exercise Plan  wrist ext/flex light dumbbells, and ROM    Consulted and Agree with Plan of Care  Patient       Patient will benefit from skilled therapeutic intervention in order to improve the following deficits and impairments:  Pain, Decreased strength, Impaired flexibility, Decreased mobility  Visit Diagnosis: Pain in right elbow     Problem List Patient Active Problem List   Diagnosis Date Noted  . CAD (coronary artery disease) 08/01/2019  . Anxiety 03/21/2019  . Dyslipidemia 03/21/2019  . Finger pain, left 10/16/2018  . Osteoarthritis of finger of left hand 10/16/2018  . Grief 07/17/2018  . Hypokalemia 06/07/2017  . Leg pain, anterior, right 06/07/2017  . Diverticulitis large intestine 07/28/2016  . Perforated diverticulum of large intestine 02/11/2016  . Abdominal pain, epigastric 01/30/2016  . HTN (hypertension)  08/14/2015  . Subdural hemorrhage following injury, no loss of consciousness ([place]) 05/22/2015  . Osteoarthritis of left midfoot 07/25/2014  . Abnormality of gait 07/25/2014  . Well adult exam 07/17/2014  . Lipoma of back 12/26/2012  . INSOMNIA, CHRONIC 06/17/2009  . Osteoarthritis 11/15/2007  . MITRAL VALVE PROLAPSE, HX OF 11/15/2007    PAA,JENNIFER 08/01/2019, 11:18 AM  [facility] [address] [place], [place], [address] Phone: [phone]   Fax:  [phone]  Name: JOSELLE BUMPAS MRN: [id number] Date of Birth: 1951/04/17   Raeford Razor, PT 08/01/19 11:19 AM Phone: [phone] Fax: [phone]

## 2019-08-01 NOTE — Patient Instructions (Signed)
Access Code: [email or web address]  URL: [email or web address]  Date: 08/01/2019  Prepared by: Raeford Razor   Exercises  Seated Wrist Flexion with Dumbbell - 10 reps - 2 sets - 5 hold - 2x daily - 7x weekly  Seated Eccentric Wrist Extension - 10 reps - 2 sets - 5 hold - 2x daily - 7x weekly  Standing Wrist Extension Stretch - 3 reps - 1 sets - 30 hold - 2x daily - 7x weekly  Standing Wrist Flexor Stretch with Arm Bent - 3 reps - 1 sets - 30 hold - 2x daily - 7x weekly  Doorway Pec Stretch at 90 Degrees Abduction - 3 reps - 1 sets - 30 hold - 2x daily - 7x weekly

## 2019-08-02 ENCOUNTER — Other Ambulatory Visit: Payer: Self-pay | Admitting: Internal Medicine

## 2019-08-02 DIAGNOSIS — E785 Hyperlipidemia, unspecified: Secondary | ICD-10-CM

## 2019-08-04 ENCOUNTER — Other Ambulatory Visit: Payer: Self-pay | Admitting: Obstetrics & Gynecology

## 2019-08-14 ENCOUNTER — Ambulatory Visit: Payer: Medicare Other | Admitting: Physical Therapy

## 2019-08-20 ENCOUNTER — Ambulatory Visit: Payer: Medicare Other | Admitting: Internal Medicine

## 2019-08-29 ENCOUNTER — Other Ambulatory Visit: Payer: Self-pay | Admitting: Internal Medicine

## 2019-09-05 ENCOUNTER — Ambulatory Visit
Admission: RE | Admit: 2019-09-05 | Discharge: 2019-09-05 | Disposition: A | Discharge status: Home / Self Care | Payer: Medicare Other | Source: Ambulatory Visit | Attending: Internal Medicine | Admitting: Internal Medicine

## 2019-09-05 ENCOUNTER — Other Ambulatory Visit: Payer: Self-pay

## 2019-09-05 DIAGNOSIS — Z1231 Encounter for screening mammogram for malignant neoplasm of breast: Secondary | ICD-10-CM

## 2019-10-15 ENCOUNTER — Other Ambulatory Visit: Payer: Self-pay | Admitting: Internal Medicine

## 2019-10-18 ENCOUNTER — Other Ambulatory Visit: Payer: Self-pay | Admitting: Internal Medicine

## 2019-10-18 DIAGNOSIS — I10 Essential (primary) hypertension: Secondary | ICD-10-CM

## 2019-10-18 DIAGNOSIS — R202 Paresthesia of skin: Secondary | ICD-10-CM

## 2019-10-18 DIAGNOSIS — E785 Hyperlipidemia, unspecified: Secondary | ICD-10-CM

## 2019-10-23 ENCOUNTER — Ambulatory Visit (INDEPENDENT_AMBULATORY_CARE_PROVIDER_SITE_OTHER): Payer: Medicare Other | Admitting: Internal Medicine

## 2019-10-23 ENCOUNTER — Other Ambulatory Visit: Payer: Self-pay

## 2019-10-23 ENCOUNTER — Other Ambulatory Visit (INDEPENDENT_AMBULATORY_CARE_PROVIDER_SITE_OTHER): Payer: Medicare Other

## 2019-10-23 ENCOUNTER — Encounter: Payer: Self-pay | Admitting: Internal Medicine

## 2019-10-23 DIAGNOSIS — I10 Essential (primary) hypertension: Secondary | ICD-10-CM

## 2019-10-23 DIAGNOSIS — E785 Hyperlipidemia, unspecified: Secondary | ICD-10-CM

## 2019-10-23 DIAGNOSIS — I2510 Atherosclerotic heart disease of native coronary artery without angina pectoris: Secondary | ICD-10-CM | POA: Diagnosis not present

## 2019-10-23 DIAGNOSIS — F419 Anxiety disorder, unspecified: Secondary | ICD-10-CM

## 2019-10-23 DIAGNOSIS — I2583 Coronary atherosclerosis due to lipid rich plaque: Secondary | ICD-10-CM

## 2019-10-23 LAB — CBC WITH DIFFERENTIAL/PLATELET
Basophils Absolute: 0 10*3/uL (ref 0.0–0.1)
Basophils Relative: 0.8 % (ref 0.0–3.0)
Eosinophils Absolute: 0.2 10*3/uL (ref 0.0–0.7)
Eosinophils Relative: 3.9 % (ref 0.0–5.0)
HCT: 42.8 % (ref 36.0–46.0)
Hemoglobin: 14.4 g/dL (ref 12.0–15.0)
Lymphocytes Relative: 42.3 % (ref 12.0–46.0)
Lymphs Abs: 2.3 10*3/uL (ref 0.7–4.0)
MCHC: 33.5 g/dL (ref 30.0–36.0)
MCV: 92.4 fl (ref 78.0–100.0)
Monocytes Absolute: 0.5 10*3/uL (ref 0.1–1.0)
Monocytes Relative: 9.7 % (ref 3.0–12.0)
Neutro Abs: 2.3 10*3/uL (ref 1.4–7.7)
Neutrophils Relative %: 43.3 % (ref 43.0–77.0)
Platelets: 301 10*3/uL (ref 150.0–400.0)
RBC: 4.63 Mil/uL (ref 3.87–5.11)
RDW: 13.6 % (ref 11.5–15.5)
WBC: 5.3 10*3/uL (ref 4.0–10.5)

## 2019-10-23 LAB — LIPID PANEL
Cholesterol: 224 mg/dL — ABNORMAL HIGH (ref 0–200)
HDL: 70.4 mg/dL (ref >39.00)
LDL Cholesterol: 142 mg/dL — ABNORMAL HIGH (ref 0–99)
NonHDL: 153.36
Total CHOL/HDL Ratio: 3
Triglycerides: 55 mg/dL (ref 0.0–149.0)
VLDL: 11 mg/dL (ref 0.0–40.0)

## 2019-10-23 LAB — BASIC METABOLIC PANEL
BUN: 21 mg/dL (ref 6–23)
CO2: 30 mEq/L (ref 19–32)
Calcium: 9.8 mg/dL (ref 8.4–10.5)
Chloride: 100 mEq/L (ref 96–112)
Creatinine, Ser: 0.78 mg/dL (ref 0.40–1.20)
GFR: 72.84 mL/min (ref >60.00)
Glucose, Bld: 88 mg/dL (ref 70–99)
Potassium: 4.1 mEq/L (ref 3.5–5.1)
Sodium: 138 mEq/L (ref 135–145)

## 2019-10-23 LAB — URINALYSIS
Bilirubin Urine: NEGATIVE
Hgb urine dipstick: NEGATIVE
Ketones, ur: NEGATIVE
Leukocytes,Ua: NEGATIVE
Nitrite: NEGATIVE
Specific Gravity, Urine: 1.02 (ref 1.000–1.030)
Total Protein, Urine: NEGATIVE
Urine Glucose: NEGATIVE
Urobilinogen, UA: 0.2 (ref 0.0–1.0)
pH: 8.5 — AB (ref 5.0–8.0)

## 2019-10-23 LAB — HEPATIC FUNCTION PANEL
ALT: 21 U/L (ref 0–35)
AST: 22 U/L (ref 0–37)
Albumin: 4.4 g/dL (ref 3.5–5.2)
Alkaline Phosphatase: 58 U/L (ref 39–117)
Bilirubin, Direct: 0.1 mg/dL (ref 0.0–0.3)
Total Bilirubin: 0.5 mg/dL (ref 0.2–1.2)
Total Protein: 7 g/dL (ref 6.0–8.3)

## 2019-10-23 LAB — TSH: TSH: 2.66 u[IU]/mL (ref 0.35–4.50)

## 2019-10-23 NOTE — Assessment & Plan Note (Signed)
  Sue [name] is on excellent diet She has not been interested in taking a statin.

## 2019-10-23 NOTE — Assessment & Plan Note (Addendum)
Alprazolam as needed-infrequent use  Potential benefits of a long term benzodiazepines  use as well as potential risks  and complications were explained to the patient and were aknowledged. 

## 2019-10-23 NOTE — Assessment & Plan Note (Addendum)
Toprol  Maxzide

## 2019-10-23 NOTE — Patient Instructions (Signed)
Vaccine line [phone]

## 2019-10-23 NOTE — Assessment & Plan Note (Signed)
Sue [name] is on excellent diet She has not been interested in taking a statin.

## 2019-10-23 NOTE — Progress Notes (Signed)
Subjective:  Patient ID: Sue [name], female    DOB: 21-Dec-1950  Age: 69 y.o. MRN: [id number]  CC: No chief complaint on file.   HPI Sue [name] presents for COVID vaccine discussion.  Sue [name] would rather not have the vaccine in spite of being in the risk group.  Vaccination  is not mandatory.  Sue [name] will continue with general preventive measures and see how things evolve with the epidemics. Follow-up: Dyslipidemia, anxiety Outpatient Medications Prior to Visit  Medication Sig Dispense Refill  . acetaminophen (TYLENOL) 325 MG tablet Take 2 tablets (650 mg total) by mouth every 6 (six) hours as needed for mild pain (or temp > 100).    . ALPRAZolam (XANAX) 0.25 MG tablet Take 1 tablet (0.25 mg total) by mouth 3 (three) times daily as needed for anxiety. Office visit needed before refills will be given 90 tablet 1  . Desoximetasone (TOPICORT) 0.25 % ointment Apply bid for aphtous ulcer 15 g 2  . diclofenac (VOLTAREN) 75 MG EC tablet TAKE 1 TABLET (75 MG TOTAL) BY MOUTH 2 (TWO) TIMES DAILY AS NEEDED. 60 tablet 1  . diclofenac Sodium (VOLTAREN) 1 % GEL Apply as directed    . ESTRACE VAGINAL 0.1 MG/GM vaginal cream Place 1 Applicatorful vaginally once a week. 42.5 g 3  . MENEST 0.3 MG tablet TAKE 1 TABLET (0.3 MG TOTAL) BY MOUTH DAILY. 90 tablet 0  . metoprolol succinate (TOPROL-XL) 25 MG 24 hr tablet Take 1 tablet (25 mg total) by mouth daily. 90 tablet 3  . Multiple Vitamin (MULTIVITAMIN) capsule Take 1 capsule by mouth daily.      Marland Kitchen OVER THE COUNTER MEDICATION Take 1 tablet by mouth 2 (two) times daily.    . polyethylene glycol powder (MIRALAX) powder Mix one cap full with at least 8 oz of liquid and drink once daily. You can use this if needed.  If your having Bowel movements without it you do not need it. 255 g 0  . potassium chloride (KLOR-CON) 8 MEQ tablet Take 1 tablet (8 mEq total) by mouth daily. 90 tablet 3  . Potassium Gluconate 550 MG TABS Take 1 tablet by mouth daily.      Marland Kitchen triamcinolone cream (KENALOG) 0.1 %     . triamterene-hydrochlorothiazide (MAXZIDE-25) 37.5-25 MG tablet Take 1 tablet by mouth daily. 90 tablet 3  . XIIDRA 5 % SOLN Place 1 drop into both eyes 2 (two) times daily.  2  . zolpidem (AMBIEN) 5 MG tablet TAKE 1 TABLET BY MOUTH AT BEDTIME AS NEEDED FOR SLEEP. Do not take w/alprazolam 90 tablet 1   No facility-administered medications prior to visit.    ROS: Review of Systems  Constitutional: Negative for activity change, appetite change, chills, fatigue and unexpected weight change.  HENT: Negative for congestion, mouth sores and sinus pressure.   Eyes: Negative for visual disturbance.  Respiratory: Negative for cough and chest tightness.   Gastrointestinal: Negative for abdominal pain and nausea.  Genitourinary: Negative for difficulty urinating, frequency and vaginal pain.  Musculoskeletal: Negative for back pain and gait problem.  Skin: Negative for pallor and rash.  Neurological: Negative for dizziness, tremors, weakness, numbness and headaches.  Psychiatric/Behavioral: Negative for confusion, sleep disturbance and suicidal ideas.    Objective:  BP 132/78 (BP Location: Right Arm, Patient Position: Sitting, Cuff Size: Normal)   Pulse 63   Temp 98 F (36.7 C) (Oral)   Ht 5' 4.5" (1.638 m)   Wt 133 lb 8 oz (60.6  kg)   SpO2 99%   BMI 22.56 kg/m   BP Readings from Last 3 Encounters:  10/23/19 132/78  03/21/19 132/74  07/17/18 124/80    Wt Readings from Last 3 Encounters:  10/23/19 133 lb 8 oz (60.6 kg)  03/21/19 132 lb (59.9 kg)  07/17/18 132 lb (59.9 kg)    Physical Exam Constitutional:      General: She is not in acute distress.    Appearance: She is well-developed.  HENT:     Head: Normocephalic.     Right Ear: External ear normal.     Left Ear: External ear normal.     Nose: Nose normal.  Eyes:     General:        Right eye: No discharge.        Left eye: No discharge.     Conjunctiva/sclera: Conjunctivae  normal.     Pupils: Pupils are equal, round, and reactive to light.  Neck:     Thyroid: No thyromegaly.     Vascular: No JVD.     Trachea: No tracheal deviation.  Cardiovascular:     Rate and Rhythm: Normal rate and regular rhythm.     Heart sounds: Normal heart sounds.  Pulmonary:     Effort: No respiratory distress.     Breath sounds: No stridor. No wheezing.  Abdominal:     General: Bowel sounds are normal. There is no distension.     Palpations: Abdomen is soft. There is no mass.     Tenderness: There is no abdominal tenderness. There is no guarding or rebound.  Musculoskeletal:        General: No tenderness.     Cervical back: Normal range of motion and neck supple.  Lymphadenopathy:     Cervical: No cervical adenopathy.  Skin:    Findings: No erythema or rash.  Neurological:     Cranial Nerves: No cranial nerve deficit.     Motor: No abnormal muscle tone.     Coordination: Coordination normal.     Deep Tendon Reflexes: Reflexes normal.  Psychiatric:        Behavior: Behavior normal.        Thought Content: Thought content normal.        Judgment: Judgment normal.     Lab Results  Component Value Date   WBC 6.8 03/20/2019   HGB 14.3 03/20/2019   HCT 43.4 03/20/2019   PLT 308.0 03/20/2019   GLUCOSE 91 03/20/2019   CHOL 212 (H) 03/20/2019   TRIG 77.0 03/20/2019   HDL 73.70 03/20/2019   LDLDIRECT 118.2 07/19/2013   LDLCALC 123 (H) 03/20/2019   ALT 21 03/20/2019   AST 18 03/20/2019   NA 139 03/20/2019   K 3.7 03/20/2019   CL 101 03/20/2019   CREATININE 0.84 03/20/2019   BUN 22 03/20/2019   CO2 28 03/20/2019   TSH 1.91 03/20/2019    MM 3D SCREEN BREAST BILATERAL  Result Date: 09/06/2019 CLINICAL DATA:  Screening. EXAM: DIGITAL SCREENING BILATERAL MAMMOGRAM WITH TOMO AND CAD COMPARISON:  Previous exam(s). ACR Breast Density Category c: The breast tissue is heterogeneously dense, which may obscure small masses. FINDINGS: There are no findings suspicious for  malignancy. Images were processed with CAD. IMPRESSION: No mammographic evidence of malignancy. A result letter of this screening mammogram will be mailed directly to the patient. RECOMMENDATION: Screening mammogram in one year. (Code:SM-B-01Y) BI-RADS CATEGORY  1: Negative. Electronically Signed   By: Abelardo Diesel M.D.   On:  09/06/2019 10:07    Assessment & Plan:    Walker Kehr, MD

## 2019-10-24 LAB — ABO/RH: Rh Factor: NEGATIVE

## 2019-10-28 ENCOUNTER — Other Ambulatory Visit: Payer: Self-pay | Admitting: Obstetrics & Gynecology

## 2019-10-30 ENCOUNTER — Encounter: Payer: Self-pay | Admitting: Internal Medicine

## 2019-12-27 ENCOUNTER — Other Ambulatory Visit: Payer: Self-pay

## 2019-12-28 ENCOUNTER — Ambulatory Visit (INDEPENDENT_AMBULATORY_CARE_PROVIDER_SITE_OTHER): Payer: Medicare Other | Admitting: Obstetrics & Gynecology

## 2019-12-28 ENCOUNTER — Encounter: Payer: Self-pay | Admitting: Obstetrics & Gynecology

## 2019-12-28 VITALS — Ht 64.0 in | Wt 133.0 lb

## 2019-12-28 DIAGNOSIS — Z79890 Hormone replacement therapy (postmenopausal): Secondary | ICD-10-CM

## 2019-12-28 DIAGNOSIS — Z01419 Encounter for gynecological examination (general) (routine) without abnormal findings: Secondary | ICD-10-CM | POA: Diagnosis not present

## 2019-12-28 DIAGNOSIS — Z124 Encounter for screening for malignant neoplasm of cervix: Secondary | ICD-10-CM

## 2019-12-28 DIAGNOSIS — Z9889 Other specified postprocedural states: Secondary | ICD-10-CM

## 2019-12-28 MED ORDER — ESTRACE 0.1 MG/GM VA CREA: 0.2500 | TOPICAL_CREAM | VAGINAL | 2 refills | Status: DC

## 2019-12-28 MED ORDER — MENEST 0.3 MG PO TABS: ORAL_TABLET | ORAL | 4 refills | Status: DC

## 2019-12-28 NOTE — Progress Notes (Signed)
Sue [name] [birth year] [id number]   History:    69 y.o. G1P1L2 Long standing boyfriend.  RP:  Established patient presenting for annual gyn exam   HPI: Menopause, well on Menest 0.3 1/2 tab q2 days, refuses to d/c. Declined combining with Progesterone in the past.  Estrace cream once a week.  No PMB.  No pelvic pain.  Abstinent.  H/O LEEP 2008.  Paps normal since then, last pap 09/2016 Negative.  Urine/BMs wnl.  Had a bowel resection because of perforation associated with diverticulitis in 2017, no GI problem since then. Breasts wnl.  BMI 22.65. Good fitness and healthy nutrition.  Cologard negative last year.   Past medical history,surgical history, family history and social history were all reviewed and documented in the EPIC chart.  Gynecologic History No LMP recorded. Patient is postmenopausal.  Obstetric History OB History  Gravida Para Term Preterm AB Living  1 1       1   SAB TAB Ectopic Multiple Live Births               # Outcome Date GA Lbr Len/2nd Weight Sex Delivery Anes PTL Lv  1 Para              ROS: A ROS was performed and pertinent positives and negatives are included in the history.  GENERAL: No fevers or chills. HEENT: No change in vision, no earache, sore throat or sinus congestion. NECK: No pain or stiffness. CARDIOVASCULAR: No chest pain or pressure. No palpitations. PULMONARY: No shortness of breath, cough or wheeze. GASTROINTESTINAL: No abdominal pain, nausea, vomiting or diarrhea, melena or bright red blood per rectum. GENITOURINARY: No urinary frequency, urgency, hesitancy or dysuria. MUSCULOSKELETAL: No joint or muscle pain, no back pain, no recent trauma. DERMATOLOGIC: No rash, no itching, no lesions. ENDOCRINE: No polyuria, polydipsia, no heat or cold intolerance. No recent change in weight. HEMATOLOGICAL: No anemia or easy bruising or bleeding. NEUROLOGIC: No headache, seizures, numbness, tingling or weakness. PSYCHIATRIC: No depression, no loss  of interest in normal activity or change in sleep pattern.     Exam:   Ht 5\' 4"  (1.626 m)   Wt 133 lb (60.3 kg)   BMI 22.83 kg/m   Body mass index is 22.83 kg/m.  General appearance : Well developed well nourished female. No acute distress HEENT: Eyes: no retinal hemorrhage or exudates,  Neck supple, trachea midline, no carotid bruits, no thyroidmegaly Lungs: Clear to auscultation, no rhonchi or wheezes, or rib retractions  Heart: Regular rate and rhythm, no murmurs or gallops Breast:Examined in sitting and supine position were symmetrical in appearance, no palpable masses or tenderness,  no skin retraction, no nipple inversion, no nipple discharge, no skin discoloration, no axillary or supraclavicular lymphadenopathy Abdomen: no palpable masses or tenderness, no rebound or guarding Extremities: no edema or skin discoloration or tenderness  Pelvic: Vulva: Normal             Vagina: No gross lesions or discharge  Cervix: No gross lesions or discharge.  Pap reflex done.  Uterus  AV, normal size, shape and consistency, non-tender and mobile  Adnexa  Without masses or tenderness  Anus: Normal   Assessment/Plan:  69 y.o. female for annual exam   1. Encounter for routine gynecological examination with Papanicolaou smear of cervix Normal gynecologic exam in menopause.  Pap reflex done today given the history of a LEEP in 2008.  Breast exam normal.  Screening mammogram December 2020 was negative.  Cologuard negative  last year.  Health labs with family physician.  Good body mass index at 22.83.  Continue with healthy nutrition and fitness.  2. H/O LEEP In 2008, Pap test negative since then.  3. Postmenopausal hormone replacement therapy Well on Menest 0.3 mg 1 tab daily.  Using minimal Estradiol cream once a week at the vulvar.  Patient is aware of risks and benefits of hormone replacement therapy at age 9, especially the risk of stroke and breast cancer.  Desires to continue on same  hormone replacement therapy.  Prescription sent to pharmacy.  Bone density in March 2018 was normal.  Will repeat at 5 years.  Other orders - Esterified Estrogens (MENEST) 0.3 MG tablet; TAKE 1 TABLET (0.3 MG TOTAL) BY MOUTH DAILY. - ESTRACE VAGINAL 0.1 MG/GM vaginal cream; Place [id number] Applicatorfuls vaginally once a week.  Princess Bruins MD, 10:28 AM 12/28/2019

## 2019-12-28 NOTE — Patient Instructions (Signed)
1. Encounter for routine gynecological examination with Papanicolaou smear of cervix Normal gynecologic exam in menopause.  Pap reflex done today given the history of a LEEP in 2008.  Breast exam normal.  Screening mammogram December 2020 was negative.  Cologuard negative last year.  Health labs with family physician.  Good body mass index at 22.83.  Continue with healthy nutrition and fitness.  2. H/O LEEP In 2008, Pap test negative since then.  3. Postmenopausal hormone replacement therapy Well on Menest 0.3 mg 1 tab daily.  Using minimal Estradiol cream once a week at the vulvar.  Patient is aware of risks and benefits of hormone replacement therapy at age 69, especially the risk of stroke and breast cancer.  Desires to continue on same hormone replacement therapy.  Prescription sent to pharmacy.  Bone density in March 2018 was normal.  Will repeat at 5 years.  Other orders - Esterified Estrogens (MENEST) 0.3 MG tablet; TAKE 1 TABLET (0.3 MG TOTAL) BY MOUTH DAILY. - ESTRACE VAGINAL 0.1 MG/GM vaginal cream; Place [id number] Applicatorfuls vaginally once a week.  [name], it was a pleasure seeing you today!  I will inform you of your results as soon as they are available.

## 2020-01-01 LAB — PAP IG W/ RFLX HPV ASCU

## 2020-02-04 ENCOUNTER — Other Ambulatory Visit: Payer: Self-pay | Admitting: Internal Medicine

## 2020-03-10 ENCOUNTER — Other Ambulatory Visit: Payer: Self-pay

## 2020-03-10 ENCOUNTER — Encounter: Payer: Self-pay | Admitting: Podiatry

## 2020-03-10 ENCOUNTER — Ambulatory Visit (INDEPENDENT_AMBULATORY_CARE_PROVIDER_SITE_OTHER): Payer: Medicare Other | Admitting: Podiatry

## 2020-03-10 DIAGNOSIS — L84 Corns and callosities: Secondary | ICD-10-CM

## 2020-03-12 NOTE — Progress Notes (Signed)
Subjective:   Patient ID: Sue [name], female   DOB: 69 y.o.   MRN: [id number]   HPI Patient states she has had lesions on both feet that is become painful and states that she still does much better than previous    ROS      Objective:  Physical Exam  Neurovascular status intact with keratotic lesions plantar     Assessment:  Chronic porokeratotic lesions     Plan:  Debridement of lesions no iatrogenic bleeding reappoint as needed

## 2020-03-20 ENCOUNTER — Other Ambulatory Visit: Payer: Self-pay | Admitting: Internal Medicine

## 2020-04-19 ENCOUNTER — Other Ambulatory Visit: Payer: Self-pay | Admitting: Internal Medicine

## 2020-04-21 ENCOUNTER — Ambulatory Visit: Payer: Medicare Other | Admitting: Internal Medicine

## 2020-04-21 MED ORDER — POTASSIUM CHLORIDE ER 8 MEQ PO TBCR: 8.0000 meq | EXTENDED_RELEASE_TABLET | Freq: Every day | ORAL | 2 refills | Status: DC

## 2020-05-27 ENCOUNTER — Telehealth: Payer: Self-pay | Admitting: Internal Medicine

## 2020-05-27 DIAGNOSIS — E785 Hyperlipidemia, unspecified: Secondary | ICD-10-CM

## 2020-05-27 DIAGNOSIS — I10 Essential (primary) hypertension: Secondary | ICD-10-CM

## 2020-05-27 NOTE — Telephone Encounter (Signed)
Patient would like lab orders in by November 1, she plans to go to [place] to get her labs drawn for her Nov 8th appt

## 2020-05-29 NOTE — Telephone Encounter (Signed)
Ok Thx 

## 2020-06-17 ENCOUNTER — Other Ambulatory Visit: Payer: Self-pay | Admitting: Internal Medicine

## 2020-07-01 ENCOUNTER — Other Ambulatory Visit: Payer: Self-pay | Admitting: Internal Medicine

## 2020-07-04 ENCOUNTER — Other Ambulatory Visit: Payer: Self-pay | Admitting: Internal Medicine

## 2020-07-04 MED ORDER — METOPROLOL SUCCINATE ER 25 MG PO TB24: 25.0000 mg | ORAL_TABLET | Freq: Every day | ORAL | 0 refills | Status: DC

## 2020-07-04 NOTE — Telephone Encounter (Signed)
Notified pt sent 14 day supply to [facility]

## 2020-07-04 NOTE — Telephone Encounter (Signed)
    Patient went of  town and forgot medication Requesting short supply of metoprolol succinate (TOPROL-XL) 25 MG 24 hr tablet be [facility] to [facility] [address] [place], [place] [address]

## 2020-07-08 ENCOUNTER — Other Ambulatory Visit: Payer: Self-pay | Admitting: Internal Medicine

## 2020-07-14 ENCOUNTER — Other Ambulatory Visit: Payer: Self-pay

## 2020-07-14 ENCOUNTER — Other Ambulatory Visit (INDEPENDENT_AMBULATORY_CARE_PROVIDER_SITE_OTHER): Payer: Medicare Other

## 2020-07-14 DIAGNOSIS — I10 Essential (primary) hypertension: Secondary | ICD-10-CM | POA: Diagnosis not present

## 2020-07-14 DIAGNOSIS — E785 Hyperlipidemia, unspecified: Secondary | ICD-10-CM

## 2020-07-14 LAB — CBC WITH DIFFERENTIAL/PLATELET
Basophils Absolute: 0.1 10*3/uL (ref 0.0–0.1)
Basophils Relative: 0.8 % (ref 0.0–3.0)
Eosinophils Absolute: 0.3 10*3/uL (ref 0.0–0.7)
Eosinophils Relative: 4.2 % (ref 0.0–5.0)
HCT: 41.5 % (ref 36.0–46.0)
Hemoglobin: 14 g/dL (ref 12.0–15.0)
Lymphocytes Relative: 32.8 % (ref 12.0–46.0)
Lymphs Abs: 2.1 10*3/uL (ref 0.7–4.0)
MCHC: 33.7 g/dL (ref 30.0–36.0)
MCV: 91.9 fl (ref 78.0–100.0)
Monocytes Absolute: 0.7 10*3/uL (ref 0.1–1.0)
Monocytes Relative: 10.7 % (ref 3.0–12.0)
Neutro Abs: 3.4 10*3/uL (ref 1.4–7.7)
Neutrophils Relative %: 51.5 % (ref 43.0–77.0)
Platelets: 289 10*3/uL (ref 150.0–400.0)
RBC: 4.51 Mil/uL (ref 3.87–5.11)
RDW: 13.9 % (ref 11.5–15.5)
WBC: 6.5 10*3/uL (ref 4.0–10.5)

## 2020-07-14 LAB — COMPREHENSIVE METABOLIC PANEL
ALT: 26 U/L (ref 0–35)
AST: 22 U/L (ref 0–37)
Albumin: 4.3 g/dL (ref 3.5–5.2)
Alkaline Phosphatase: 61 U/L (ref 39–117)
BUN: 19 mg/dL (ref 6–23)
CO2: 31 mEq/L (ref 19–32)
Calcium: 9.3 mg/dL (ref 8.4–10.5)
Chloride: 100 mEq/L (ref 96–112)
Creatinine, Ser: 0.86 mg/dL (ref 0.40–1.20)
GFR: 67.91 mL/min (ref >60.00)
Glucose, Bld: 87 mg/dL (ref 70–99)
Potassium: 4 mEq/L (ref 3.5–5.1)
Sodium: 139 mEq/L (ref 135–145)
Total Bilirubin: 0.5 mg/dL (ref 0.2–1.2)
Total Protein: 6.9 g/dL (ref 6.0–8.3)

## 2020-07-14 LAB — LIPID PANEL
Cholesterol: 208 mg/dL — ABNORMAL HIGH (ref 0–200)
HDL: 68.3 mg/dL (ref >39.00)
LDL Cholesterol: 126 mg/dL — ABNORMAL HIGH (ref 0–99)
NonHDL: 139.57
Total CHOL/HDL Ratio: 3
Triglycerides: 66 mg/dL (ref 0.0–149.0)
VLDL: 13.2 mg/dL (ref 0.0–40.0)

## 2020-07-14 LAB — URINALYSIS
Bilirubin Urine: NEGATIVE
Hgb urine dipstick: NEGATIVE
Ketones, ur: NEGATIVE
Leukocytes,Ua: NEGATIVE
Nitrite: NEGATIVE
Specific Gravity, Urine: 1.015 (ref 1.000–1.030)
Total Protein, Urine: NEGATIVE
Urine Glucose: NEGATIVE
Urobilinogen, UA: 0.2 (ref 0.0–1.0)
pH: 8 (ref 5.0–8.0)

## 2020-07-14 LAB — TSH: TSH: 3.09 u[IU]/mL (ref 0.35–4.50)

## 2020-07-14 NOTE — Addendum Note (Signed)
Addended by: Boris Lown B on: 07/14/2020 09:07 AM   Modules accepted: Orders

## 2020-07-21 ENCOUNTER — Ambulatory Visit (INDEPENDENT_AMBULATORY_CARE_PROVIDER_SITE_OTHER): Payer: Medicare Other | Admitting: Internal Medicine

## 2020-07-21 ENCOUNTER — Other Ambulatory Visit: Payer: Self-pay

## 2020-07-21 ENCOUNTER — Encounter: Payer: Self-pay | Admitting: Internal Medicine

## 2020-07-21 VITALS — BP 138/84 | HR 64 | Temp 97.8°F | Wt 133.6 lb

## 2020-07-21 DIAGNOSIS — I10 Essential (primary) hypertension: Secondary | ICD-10-CM | POA: Diagnosis not present

## 2020-07-21 DIAGNOSIS — Z0000 Encounter for general adult medical examination without abnormal findings: Secondary | ICD-10-CM

## 2020-07-21 DIAGNOSIS — F419 Anxiety disorder, unspecified: Secondary | ICD-10-CM | POA: Diagnosis not present

## 2020-07-21 DIAGNOSIS — E785 Hyperlipidemia, unspecified: Secondary | ICD-10-CM

## 2020-07-21 DIAGNOSIS — M1990 Unspecified osteoarthritis, unspecified site: Secondary | ICD-10-CM | POA: Diagnosis not present

## 2020-07-21 DIAGNOSIS — Z1211 Encounter for screening for malignant neoplasm of colon: Secondary | ICD-10-CM

## 2020-07-21 MED ORDER — DICLOFENAC SODIUM 75 MG PO TBEC
75.0000 mg | DELAYED_RELEASE_TABLET | Freq: Two times a day (BID) | ORAL | 1 refills | Status: DC | PRN
Start: 2020-07-21 — End: 2020-08-20

## 2020-07-21 MED ORDER — ZOLPIDEM TARTRATE 5 MG PO TABS: ORAL_TABLET | ORAL | 1 refills | Status: DC

## 2020-07-21 MED ORDER — ALPRAZOLAM 0.25 MG PO TABS
ORAL_TABLET | ORAL | 1 refills | Status: DC
Start: 2020-07-21 — End: 2021-03-19

## 2020-07-21 MED ORDER — METOPROLOL SUCCINATE ER 25 MG PO TB24
25.0000 mg | ORAL_TABLET | Freq: Every day | ORAL | 1 refills | Status: DC
Start: 2020-07-21 — End: 2020-11-06

## 2020-07-21 MED ORDER — TRIAMTERENE-HCTZ 37.5-25 MG PO TABS: 1.0000 | ORAL_TABLET | Freq: Every day | ORAL | 1 refills | Status: DC

## 2020-07-21 NOTE — Progress Notes (Signed)
Subjective:  Patient ID: Sue [name], female    DOB: [birth year]  Age: 69 y.o. MRN: [id number]  CC: Follow-up (6 Month F/U)   HPI MARQUETA PULLEY presents for a yearly exam F/u CAD, dyslipidemia, anxiety Not COVID vaccinated - discussed. Pat declined vaccination  Outpatient Medications Prior to Visit  Medication Sig Dispense Refill   acetaminophen (TYLENOL) 325 MG tablet Take 2 tablets (650 mg total) by mouth every 6 (six) hours as needed for mild pain (or temp > 100).     ALPRAZolam (XANAX) 0.25 MG tablet TAKE 1 TABLET BY MOUTH 3 TIMES A DAY AS NEEDED FOR ANXIETY *OFFICE VISIT NEEDED BEFOREA ANY REFILLS* 90 tablet 1   Desoximetasone (TOPICORT) 0.25 % ointment Apply bid for aphtous ulcer 15 g 2   diclofenac Sodium (VOLTAREN) 1 % GEL Apply as directed     Esterified Estrogens (MENEST) 0.3 MG tablet TAKE 1 TABLET (0.3 MG TOTAL) BY MOUTH DAILY. 90 tablet 4   ESTRACE VAGINAL 0.1 MG/GM vaginal cream Place 9.39 Applicatorfuls vaginally once a week. 42.5 g 2   Multiple Vitamin (MULTIVITAMIN) capsule Take 1 capsule by mouth daily.       OVER THE COUNTER MEDICATION Take 1 tablet by mouth 2 (two) times daily.     polyethylene glycol powder (MIRALAX) powder Mix one cap full with at least 8 oz of liquid and drink once daily. You can use this if needed.  If your having Bowel movements without it you do not need it. 255 g 0   potassium chloride (KLOR-CON) 8 MEQ tablet Take 1 tablet (8 mEq total) by mouth daily. 90 tablet 2   Potassium Gluconate 550 MG TABS Take 1 tablet by mouth daily.     triamcinolone cream (KENALOG) 0.1 %      XIIDRA 5 % SOLN Place 1 drop into both eyes 2 (two) times daily.  2   zolpidem (AMBIEN) 5 MG tablet TAKE 1 TABLET BY MOUTH AT BEDTIME AS NEEDED FOR SLEEP. Do not take w/alprazolam 90 tablet 1   diclofenac (VOLTAREN) 75 MG EC tablet TAKE 1 TABLET (75 MG TOTAL) BY MOUTH 2 (TWO) TIMES DAILY AS NEEDED. 60 tablet 1   metoprolol succinate (TOPROL-XL) 25  MG 24 hr tablet Take 1 tablet (25 mg total) by mouth daily. 2 week supply 14 tablet 0   triamterene-hydrochlorothiazide (MAXZIDE-25) 37.5-25 MG tablet TAKE 1 TABLET BY MOUTH EVERY DAY 90 tablet 3   No facility-administered medications prior to visit.    ROS: Review of Systems  Constitutional: Negative for activity change, appetite change, chills, fatigue and unexpected weight change.  HENT: Negative for congestion, mouth sores and sinus pressure.   Eyes: Negative for visual disturbance.  Respiratory: Negative for cough and chest tightness.   Gastrointestinal: Negative for abdominal pain and nausea.  Genitourinary: Negative for difficulty urinating, frequency and vaginal pain.  Musculoskeletal: Negative for back pain and gait problem.  Skin: Negative for pallor and rash.  Neurological: Negative for dizziness, tremors, weakness, numbness and headaches.  Psychiatric/Behavioral: Negative for confusion, sleep disturbance and suicidal ideas.    Objective:  BP 138/84 (BP Location: Left Arm)    Pulse 64    Temp 97.8 F (36.6 C) (Oral)    Wt 133 lb 9.6 oz (60.6 kg)    SpO2 99%    BMI 22.93 kg/m   BP Readings from Last 3 Encounters:  07/21/20 138/84  10/23/19 132/78  03/21/19 132/74    Wt Readings from Last 3 Encounters:  07/21/20 133 lb 9.6 oz (60.6 kg)  12/28/19 133 lb (60.3 kg)  10/23/19 133 lb 8 oz (60.6 kg)    Physical Exam Constitutional:      General: She is not in acute distress.    Appearance: She is well-developed.  HENT:     Head: Normocephalic.     Right Ear: External ear normal.     Left Ear: External ear normal.     Nose: Nose normal.  Eyes:     General:        Right eye: No discharge.        Left eye: No discharge.     Conjunctiva/sclera: Conjunctivae normal.     Pupils: Pupils are equal, round, and reactive to light.  Neck:     Thyroid: No thyromegaly.     Vascular: No JVD.     Trachea: No tracheal deviation.  Cardiovascular:     Rate and Rhythm: Normal  rate and regular rhythm.     Heart sounds: Normal heart sounds.  Pulmonary:     Effort: No respiratory distress.     Breath sounds: No stridor. No wheezing.  Abdominal:     General: Bowel sounds are normal. There is no distension.     Palpations: Abdomen is soft. There is no mass.     Tenderness: There is no abdominal tenderness. There is no guarding or rebound.  Musculoskeletal:        General: No tenderness.     Cervical back: Normal range of motion and neck supple.  Lymphadenopathy:     Cervical: No cervical adenopathy.  Skin:    Findings: No erythema or rash.  Neurological:     Cranial Nerves: No cranial nerve deficit.     Motor: No abnormal muscle tone.     Coordination: Coordination normal.     Deep Tendon Reflexes: Reflexes normal.  Psychiatric:        Behavior: Behavior normal.        Thought Content: Thought content normal.        Judgment: Judgment normal.     Lab Results  Component Value Date   WBC 6.5 07/14/2020   HGB 14.0 07/14/2020   HCT 41.5 07/14/2020   PLT 289.0 07/14/2020   GLUCOSE 87 07/14/2020   CHOL 208 (H) 07/14/2020   TRIG 66.0 07/14/2020   HDL 68.30 07/14/2020   LDLDIRECT 118.2 07/19/2013   LDLCALC 126 (H) 07/14/2020   ALT 26 07/14/2020   AST 22 07/14/2020   NA 139 07/14/2020   K 4.0 07/14/2020   CL 100 07/14/2020   CREATININE 0.86 07/14/2020   BUN 19 07/14/2020   CO2 31 07/14/2020   TSH 3.09 07/14/2020    MM 3D SCREEN BREAST BILATERAL  Result Date: 09/06/2019 CLINICAL DATA:  Screening. EXAM: DIGITAL SCREENING BILATERAL MAMMOGRAM WITH TOMO AND CAD COMPARISON:  Previous exam(s). ACR Breast Density Category c: The breast tissue is heterogeneously dense, which may obscure small masses. FINDINGS: There are no findings suspicious for malignancy. Images were processed with CAD. IMPRESSION: No mammographic evidence of malignancy. A result letter of this screening mammogram will be mailed directly to the patient. RECOMMENDATION: Screening  mammogram in one year. (Code:SM-B-01Y) BI-RADS CATEGORY  1: Negative. Electronically Signed   By: Abelardo Diesel M.D.   On: 09/06/2019 10:07    Assessment & Plan:   There are no diagnoses linked to this encounter.   Meds ordered this encounter  Medications   diclofenac (VOLTAREN) 75 MG EC tablet  Sig: Take 1 tablet (75 mg total) by mouth 2 (two) times daily as needed.    Dispense:  60 tablet    Refill:  1   metoprolol succinate (TOPROL-XL) 25 MG 24 hr tablet    Sig: Take 1 tablet (25 mg total) by mouth daily.    Dispense:  90 tablet    Refill:  1   triamterene-hydrochlorothiazide (MAXZIDE-25) 37.5-25 MG tablet    Sig: Take 1 tablet by mouth daily.    Dispense:  90 tablet    Refill:  1     Follow-up: No follow-ups on file.  Walker Kehr, MD

## 2020-07-21 NOTE — Assessment & Plan Note (Signed)
Voltaren prn pc - rare use; now taking bid 2021 Pepcid daily recommended, declined

## 2020-07-21 NOTE — Assessment & Plan Note (Signed)
Alprazolam as needed-infrequent use  Potential benefits of a long term benzodiazepines  use as well as potential risks  and complications were explained to the patient and were aknowledged. 

## 2020-07-21 NOTE — Assessment & Plan Note (Signed)
Maxzide

## 2020-07-21 NOTE — Assessment & Plan Note (Addendum)
Sue [name] is on excellent diet She has not been interested in taking a statin. Better on diet

## 2020-07-22 ENCOUNTER — Other Ambulatory Visit: Payer: Self-pay | Admitting: Internal Medicine

## 2020-07-22 DIAGNOSIS — Z1231 Encounter for screening mammogram for malignant neoplasm of breast: Secondary | ICD-10-CM

## 2020-08-18 ENCOUNTER — Encounter: Payer: Self-pay | Admitting: Internal Medicine

## 2020-08-20 ENCOUNTER — Other Ambulatory Visit: Payer: Self-pay | Admitting: *Deleted

## 2020-08-20 MED ORDER — DICLOFENAC SODIUM 75 MG PO TBEC
75.0000 mg | DELAYED_RELEASE_TABLET | Freq: Two times a day (BID) | ORAL | 1 refills | Status: DC | PRN
Start: 2020-08-20 — End: 2020-12-10

## 2020-08-27 LAB — COLOGUARD: Cologuard: POSITIVE — AB

## 2020-09-11 ENCOUNTER — Ambulatory Visit
Admission: RE | Admit: 2020-09-11 | Discharge: 2020-09-11 | Disposition: A | Discharge status: Home / Self Care | Payer: Medicare Other | Source: Ambulatory Visit | Attending: Internal Medicine | Admitting: Internal Medicine

## 2020-09-11 ENCOUNTER — Other Ambulatory Visit: Payer: Self-pay

## 2020-09-11 DIAGNOSIS — Z1231 Encounter for screening mammogram for malignant neoplasm of breast: Secondary | ICD-10-CM

## 2020-09-15 ENCOUNTER — Other Ambulatory Visit: Payer: Self-pay | Admitting: Internal Medicine

## 2020-09-15 DIAGNOSIS — R195 Other fecal abnormalities: Secondary | ICD-10-CM

## 2020-09-15 NOTE — Telephone Encounter (Signed)
Check cologuard portal.. printed report. Place on MD desk.Marland KitchenRaechel [name]

## 2020-09-15 NOTE — Telephone Encounter (Signed)
-----   Message from Tresa Garter, MD sent at 09/12/2020  4:38 PM EST ----- Regarding: Cologuard report Hi Tykel Badie, You know we have the report back? Thanks AP    Pat, I do not see it back yet.  I will check. Have a happy new year! AP ===View-only below this line===   ----- Message -----      From:Almendra Zackery Barefoot      Sent:09/11/2020  4:05 PM EST        BD:ZHGD Plotnikov, MD   Subject:Pat Weatherman's cologuard test  Just wondering if you have an

## 2020-09-15 NOTE — Progress Notes (Signed)
08/27/2020 (+) cologuard. I called Pat. GI ref was made

## 2020-09-19 ENCOUNTER — Ambulatory Visit (INDEPENDENT_AMBULATORY_CARE_PROVIDER_SITE_OTHER): Payer: Medicare Other | Admitting: Gastroenterology

## 2020-09-19 ENCOUNTER — Encounter: Payer: Self-pay | Admitting: Gastroenterology

## 2020-09-19 VITALS — BP 130/70 | HR 66 | Ht 64.5 in | Wt 133.0 lb

## 2020-09-19 DIAGNOSIS — R195 Other fecal abnormalities: Secondary | ICD-10-CM

## 2020-09-19 MED ORDER — ONDANSETRON HCL 4 MG PO TABS: ORAL_TABLET | ORAL | 0 refills | Status: DC

## 2020-09-19 MED ORDER — SUTAB 1479-225-188 MG PO TABS: ORAL_TABLET | ORAL | 0 refills | Status: DC

## 2020-09-19 NOTE — Patient Instructions (Addendum)
You have been scheduled for a colonoscopy. Please follow written instructions given to you at your visit today.  Please pick up your prep supplies at the pharmacy within the next 1-3 days. If you use inhalers (even only as needed), please bring them with you on the day of your procedure.   Due to recent changes in healthcare laws, you may see the results of your imaging and laboratory studies on MyChart before your provider has had a chance to review them.  We understand that in some cases there may be results that are confusing or concerning to you. Not all laboratory results come back in the same time frame and the provider may be waiting for multiple results in order to interpret others.  Please give us 48 hours in order for your provider to thoroughly review all the results before contacting the office for clarification of your results.   I appreciate the  opportunity to care for you  Thank You   Kavitha Nandigam , MD  

## 2020-09-19 NOTE — Progress Notes (Signed)
Sue [name]    [id number]    08/04/1951  Primary Care Physician:Plotnikov, Evie Lacks, MD  Referring Physician: Cassandria Anger, MD [place],  [place] [address]   Chief complaint: Positive Cologuard HPI: 70 year old very pleasant female here for new patient visit to discuss further evaluation with positive Cologuard  She has no specific GI symptoms other than intermittent rectal bleeding, feels it comes from hemorrhoids.  She uses suppositories per rectum as needed with improvement of symptoms from hemorrhoids  She was previously followed by Dr. Olevia Perches, last seen in 2010.  Colonoscopy 02/11/2009: Diverticulosis and hemorrhoids. Otherwise normal.  She had episode of acute diverticulitis complicated by perforation in May 2017, underwent partial colectomy with colostomy and subsequent takedown of colostomy in November 2017  She is doing well postoperatively, has no complaints. She had negative cologaurd in November 2019 prior to the most recent positive cologaurd November 2021 She thinks she was bleeding the day prior to the cologaurd test and feels it is positive because of it.   Outpatient Encounter Medications as of 09/19/2020  Medication Sig  . acetaminophen (TYLENOL) 325 MG tablet Take 2 tablets (650 mg total) by mouth every 6 (six) hours as needed for mild pain (or temp > 100).  . ALPRAZolam (XANAX) 0.25 MG tablet TAKE 1 TABLET BY MOUTH 3 TIMES A DAY AS NEEDED FOR ANXIETY *OFFICE VISIT NEEDED BEFOREA ANY REFILLS*  . Desoximetasone (TOPICORT) 0.25 % ointment Apply bid for aphtous ulcer  . diclofenac (VOLTAREN) 75 MG EC tablet Take 1 tablet (75 mg total) by mouth 2 (two) times daily as needed.  . diclofenac Sodium (VOLTAREN) 1 % GEL Apply as directed  . Esterified Estrogens (MENEST) 0.3 MG tablet TAKE 1 TABLET (0.3 MG TOTAL) BY MOUTH DAILY.  Marland Kitchen ESTRACE VAGINAL 0.1 MG/GM vaginal cream Place 2.44 Applicatorfuls vaginally once a week.  .  metoprolol succinate (TOPROL-XL) 25 MG 24 hr tablet Take 1 tablet (25 mg total) by mouth daily.  . Multiple Vitamin (MULTIVITAMIN) capsule Take 1 capsule by mouth daily.  Marland Kitchen OVER THE COUNTER MEDICATION Take 1 tablet by mouth 2 (two) times daily.  . polyethylene glycol powder (MIRALAX) powder Mix one cap full with at least 8 oz of liquid and drink once daily. You can use this if needed.  If your having Bowel movements without it you do not need it.  . potassium chloride (KLOR-CON) 8 MEQ tablet Take 1 tablet (8 mEq total) by mouth daily.  . Potassium Gluconate 550 MG TABS Take 1 tablet by mouth daily.  Marland Kitchen triamcinolone cream (KENALOG) 0.1 %   . triamterene-hydrochlorothiazide (MAXZIDE-25) 37.5-25 MG tablet Take 1 tablet by mouth daily.  Marland Kitchen XIIDRA 5 % SOLN Place 1 drop into both eyes 2 (two) times daily.  Marland Kitchen zolpidem (AMBIEN) 5 MG tablet TAKE 1 TABLET BY MOUTH AT BEDTIME AS NEEDED FOR SLEEP. Do not take w/alprazolam   No facility-administered encounter medications on file as of 09/19/2020.    Allergies as of 09/19/2020 - Review Complete 09/19/2020  Allergen Reaction Noted  . Clindamycin Anaphylaxis 08/22/2015  . Aspirin  10/30/2019  . Fish oil  10/30/2019  . Latex  01/28/2009  . Penicillins Hives     Past Medical History:  Diagnosis Date  . FRACTURE, ARM, LEFT ~ 1960  . FRACTURE, FOOT ~ 1960  . FRACTURE, INDEX FINGER, LEFT ~ 1960  . Heart murmur   . HYPERTENSION 11/15/2007  . INSOMNIA,  CHRONIC 06/17/2009  . MITRAL VALVE PROLAPSE, HX OF   . OSTEOARTHRITIS 11/15/2007  . Subdural hematoma (Antoine) 03/2015   S/P fall    Past Surgical History:  Procedure Laterality Date  . CATARACT EXTRACTION W/ INTRAOCULAR LENS IMPLANT Left 01/2015  . COLON RESECTION N/A 07/28/2016   Procedure: LAPAROSCOPIC ASSISTED  COLOSTOMY TAKEDOWN WITH COLORECTAL ANASTOMOSIS;  Surgeon: Mickeal Skinner, MD;  Location: [facility];  Service: General;  Laterality: N/A;  . COLON SURGERY    . COLONOSCOPY    . COLOSTOMY TAKEDOWN   07/28/2016   laparoscopic converted to open take down of end colostomy with colo-rectostomy/notes 07/28/2016  . PARTIAL COLECTOMY N/A 02/11/2016   Procedure: PARTIAL COLECTOMY with colostomy;  Surgeon: Arta Bruce Kinsinger, MD;  Location: [facility] OR;  Service: General;  Laterality: N/A;  . TONSILLECTOMY      Family History  Problem Relation Age of Onset  . Breast cancer Mother   . Hypertension Father   . Cancer Father        LUNG  . Parkinsonism Other   . Colon cancer Neg Hx   . Esophageal cancer Neg Hx     Social History   Socioeconomic History  . Marital status: Divorced    Spouse name: Not on file  . Number of children: 2  . Years of education: Not on file  . Highest education level: Not on file  Occupational History  . Occupation: Home maker  Tobacco Use  . Smoking status: Never Smoker  . Smokeless tobacco: Never Used  Vaping Use  . Vaping Use: Never used  Substance and Sexual Activity  . Alcohol use: Yes    Alcohol/week: 1.0 standard drink    Types: 1 Glasses of wine per week    Comment: RARE  . Drug use: No  . Sexual activity: Never    Comment: 1st intercourse- 20,  Other Topics Concern  . Not on file  Social History Narrative   [place], [place]   Married '72-17 yrs then divorced; remarried '90 - 4 yrs divorced. 1 boy - '75; 1 daughter '83. Single partner since 2001. Socially active, riding horses not hunting but jumping little jumps. Lost her mother Spring '12 - doing OK with loss.   Social Determinants of Health   Financial Resource Strain: Not on file  Food Insecurity: Not on file  Transportation Needs: Not on file  Physical Activity: Not on file  Stress: Not on file  Social Connections: Not on file  Intimate Partner Violence: Not on file      Review of systems: All other review of systems negative except as mentioned in the HPI.   Physical Exam: Vitals:   09/19/20 1102  BP: 130/70  Pulse: 66   Body mass index is 22.48 kg/m. Gen:       No acute distress HEENT:  sclera anicteric Abd:      soft, non-tender; no palpable masses, no distension, well-healed surgical scar from colostomy takedown Ext:    No edema Neuro: alert and oriented x 3 Psych: normal mood and affect  Data Reviewed:  Reviewed labs, radiology imaging, old records and pertinent past GI work up   Assessment and Plan/Recommendations:  70 year old very pleasant female with history of diverticulitis complicated by perforation s/p partial colectomy with colostomy and subsequent takedown of colostomy here for evaluation of positive Cologuard Last colonoscopy in 2010 with no polyps  Will schedule for colonoscopy for further evaluation of positive Cologuard, exclude any neoplastic lesions The risks and  benefits as well as alternatives of endoscopic procedure(s) have been discussed and reviewed. All questions answered. The patient agrees to proceed.  Symptomatic hemorrhoids: We will consider hemorrhoidal band ligation after colonoscopy if patient wants to proceed with it    The patient was provided an opportunity to ask questions and all were answered. The patient agreed with the plan and demonstrated an understanding of the instructions.  Damaris Hippo , MD    CC: Plotnikov, Evie Lacks, MD

## 2020-09-19 NOTE — Addendum Note (Signed)
Addended by: Oda Kilts on: 09/19/2020 02:31 PM   Modules accepted: Orders

## 2020-10-22 DIAGNOSIS — M25511 Pain in right shoulder: Secondary | ICD-10-CM | POA: Diagnosis not present

## 2020-10-27 DIAGNOSIS — M25611 Stiffness of right shoulder, not elsewhere classified: Secondary | ICD-10-CM | POA: Diagnosis not present

## 2020-10-27 DIAGNOSIS — M7501 Adhesive capsulitis of right shoulder: Secondary | ICD-10-CM | POA: Diagnosis not present

## 2020-10-27 DIAGNOSIS — M6281 Muscle weakness (generalized): Secondary | ICD-10-CM | POA: Diagnosis not present

## 2020-10-27 DIAGNOSIS — Z1159 Encounter for screening for other viral diseases: Secondary | ICD-10-CM | POA: Diagnosis not present

## 2020-10-29 DIAGNOSIS — M6281 Muscle weakness (generalized): Secondary | ICD-10-CM | POA: Diagnosis not present

## 2020-10-29 DIAGNOSIS — M25611 Stiffness of right shoulder, not elsewhere classified: Secondary | ICD-10-CM | POA: Diagnosis not present

## 2020-10-29 DIAGNOSIS — M7501 Adhesive capsulitis of right shoulder: Secondary | ICD-10-CM | POA: Diagnosis not present

## 2020-11-03 ENCOUNTER — Other Ambulatory Visit: Payer: Self-pay | Admitting: Gastroenterology

## 2020-11-03 DIAGNOSIS — Z1159 Encounter for screening for other viral diseases: Secondary | ICD-10-CM | POA: Diagnosis not present

## 2020-11-03 LAB — SARS CORONAVIRUS 2 (TAT 6-24 HRS): SARS Coronavirus 2: NEGATIVE

## 2020-11-05 ENCOUNTER — Ambulatory Visit (AMBULATORY_SURGERY_CENTER): Payer: Medicare Other | Admitting: Gastroenterology

## 2020-11-05 ENCOUNTER — Other Ambulatory Visit: Payer: Self-pay

## 2020-11-05 ENCOUNTER — Encounter: Payer: Self-pay | Admitting: Gastroenterology

## 2020-11-05 VITALS — BP 200/102 | HR 59 | Temp 98.2°F | Resp 24 | Ht 64.0 in | Wt 133.0 lb

## 2020-11-05 DIAGNOSIS — D128 Benign neoplasm of rectum: Secondary | ICD-10-CM | POA: Diagnosis not present

## 2020-11-05 DIAGNOSIS — D122 Benign neoplasm of ascending colon: Secondary | ICD-10-CM

## 2020-11-05 DIAGNOSIS — D123 Benign neoplasm of transverse colon: Secondary | ICD-10-CM

## 2020-11-05 DIAGNOSIS — K635 Polyp of colon: Secondary | ICD-10-CM | POA: Diagnosis not present

## 2020-11-05 DIAGNOSIS — R195 Other fecal abnormalities: Secondary | ICD-10-CM

## 2020-11-05 MED ORDER — SODIUM CHLORIDE 0.9 % IV SOLN
500.0000 mL | Freq: Once | INTRAVENOUS | Status: DC
Start: 2020-11-05 — End: 2020-11-05

## 2020-11-05 NOTE — Progress Notes (Signed)
Vitals by CW 

## 2020-11-05 NOTE — Patient Instructions (Signed)
Discharge instructions given. ?Handouts on polyps and Hemorrhoids. ?Resume previous medications. ?YOU HAD AN ENDOSCOPIC PROCEDURE TODAY AT THE [facility]:   Refer to the procedure report that was given to you for any specific questions about what was found during the examination.  If the procedure report does not answer your questions, please call your gastroenterologist to clarify.  If you requested that your care partner not be given the details of your procedure findings, then the procedure report has been included in a sealed envelope for you to review at your convenience later. ? ?YOU SHOULD EXPECT: Some feelings of bloating in the abdomen. Passage of more gas than usual.  Walking can help get rid of the air that was put into your GI tract during the procedure and reduce the bloating. If you had a lower endoscopy (such as a colonoscopy or flexible sigmoidoscopy) you may notice spotting of blood in your stool or on the toilet paper. If you underwent a bowel prep for your procedure, you may not have a normal bowel movement for a few days. ? ?Please Note:  You might notice some irritation and congestion in your nose or some drainage.  This is from the oxygen used during your procedure.  There is no need for concern and it should clear up in a day or so. ? ?SYMPTOMS TO REPORT IMMEDIATELY: ? ?Following lower endoscopy (colonoscopy or flexible sigmoidoscopy): ? Excessive amounts of blood in the stool ? Significant tenderness or worsening of abdominal pains ? Swelling of the abdomen that is new, acute ? Fever of 100?F or higher ? ? ?For urgent or emergent issues, a gastroenterologist can be reached at any hour by calling [phone]. ?Do not use MyChart messaging for urgent concerns.  ? ? ?DIET:  We do recommend a small meal at first, but then you may proceed to your regular diet.  Drink plenty of fluids but you should avoid alcoholic beverages for 24 hours. ? ?ACTIVITY:  You should plan to take it  easy for the rest of today and you should NOT DRIVE or use heavy machinery until tomorrow (because of the sedation medicines used during the test).   ? ?FOLLOW UP: ?Our staff will call the number listed on your records 48-72 hours following your procedure to check on you and address any questions or concerns that you may have regarding the information given to you following your procedure. If we do not reach you, we will leave a message.  We will attempt to reach you two times.  During this call, we will ask if you have developed any symptoms of COVID 19. If you develop any symptoms (ie: fever, flu-like symptoms, shortness of breath, cough etc.) before then, please call [phone].  If you test positive for Covid 19 in the 2 weeks post procedure, please call and report this information to us.   ? ?If any biopsies were taken you will be contacted by phone or by letter within the next 1-3 weeks.  Please call us at [phone] if you have not heard about the biopsies in 3 weeks.  ? ? ?SIGNATURES/CONFIDENTIALITY: ?You and/or your care partner have signed paperwork which will be entered into your electronic medical record.  These signatures attest to the fact that that the information above on your After Visit Summary has been reviewed and is understood.  Full responsibility of the confidentiality of this discharge information lies with you and/or your care-partner.  ?

## 2020-11-05 NOTE — Progress Notes (Signed)
Patient's BP is quite high.  States that it was up when she came in.  CRNA gave her medication in the procedure room to bring down the bp.  Patient's bp gradually went down, but then came back up in recovery.  Patient insists that this is "normal" for her.  States that it will come down when she gets home. Friend will be sure that the patient takes her bp and her medicine when home.  I suggested that the patient make an appointment with her PCP.

## 2020-11-05 NOTE — Progress Notes (Signed)
Called to room to assist during endoscopic procedure.  Patient ID and intended procedure confirmed with present staff. Received instructions for my participation in the procedure from the performing physician.  

## 2020-11-05 NOTE — Op Note (Signed)
[place] Patient Name: Saylor Murry Procedure Date: 11/05/2020 4:06 PM MRN: [id number] Endoscopist: Mauri Pole , MD Age: 70 Referring MD:  Date of Birth: 1951-05-03 Gender: Female Account #: [id number] Procedure:                Colonoscopy Indications:              Positive Cologuard test Medicines:                Monitored Anesthesia Care Procedure:                Pre-Anesthesia Assessment:                           - Prior to the procedure, a History and Physical                            was performed, and patient medications and                            allergies were reviewed. The patient's tolerance of                            previous anesthesia was also reviewed. The risks                            and benefits of the procedure and the sedation                            options and risks were discussed with the patient.                            All questions were answered, and informed consent                            was obtained. Prior Anticoagulants: The patient has                            taken no previous anticoagulant or antiplatelet                            agents. ASA Grade Assessment: III - A patient with                            severe systemic disease. After reviewing the risks                            and benefits, the patient was deemed in                            satisfactory condition to undergo the procedure.                           After obtaining informed consent, the colonoscope  was passed under direct vision. Throughout the                            procedure, the patient's blood pressure, pulse, and                            oxygen saturations were monitored continuously. The                            Olympus PCF-H190DL (#[id number]) Colonoscope was                            introduced through the anus and advanced to the the                            cecum, identified by  appendiceal orifice and                            ileocecal valve. The colonoscopy was performed                            without difficulty. The patient tolerated the                            procedure well. The quality of the bowel                            preparation was adequate to identify polyps 6 mm                            and larger in size. The ileocecal valve,                            appendiceal orifice, and rectum were photographed. Scope In: 4:16:30 PM Scope Out: 4:38:44 PM Scope Withdrawal Time: 0 hours 19 minutes 5 seconds  Total Procedure Duration: 0 hours 22 minutes 14 seconds  Findings:                 The perianal and digital rectal examinations were                            normal.                           Two sessile polyps were found in the rectum and                            ascending colon. The polyps were 5 to 7 mm in size.                            These polyps were removed with a cold snare.                            Resection and retrieval were complete.  A 18 mm polyp was found in the transverse colon.                            The polyp was mixed lateral spreading with central                            depression concerning for possible infiltratrion.                            Biopsies were taken with a cold forceps for                            histology. Opposite and distal fold area was                            tattooed with an injection of 3 mL of Spot (carbon                            black).                           Non-bleeding external and internal hemorrhoids were                            found during retroflexion. The hemorrhoids were                            medium-sized. Complications:            No immediate complications. Estimated Blood Loss:     Estimated blood loss was minimal. Impression:               - Two 5 to 7 mm polyps in the rectum and in the                             ascending colon, removed with a cold snare.                            Resected and retrieved.                           - One 18 mm polyp in the transverse colon.                            Biopsied. Tattooed.                           - Non-bleeding external and internal hemorrhoids. Recommendation:           - Patient has a contact number available for                            emergencies. The signs and symptoms of potential  delayed complications were discussed with the                            patient. Return to normal activities tomorrow.                            Written discharge instructions were provided to the                            patient.                           - Resume previous diet.                           - Continue present medications.                           - Await pathology results.                           - Repeat colonoscopy date to be determined after                            pending pathology results are reviewed for                            surveillance based on pathology results. Mauri Pole, MD 11/05/2020 4:45:14 PM This report has been signed electronically.

## 2020-11-05 NOTE — Progress Notes (Signed)
PT taken to PACU. Monitors in place. VSS. Report given to RN. 

## 2020-11-06 ENCOUNTER — Telehealth: Payer: Self-pay | Admitting: Internal Medicine

## 2020-11-06 DIAGNOSIS — M25611 Stiffness of right shoulder, not elsewhere classified: Secondary | ICD-10-CM | POA: Diagnosis not present

## 2020-11-06 DIAGNOSIS — M7501 Adhesive capsulitis of right shoulder: Secondary | ICD-10-CM | POA: Diagnosis not present

## 2020-11-06 DIAGNOSIS — M6281 Muscle weakness (generalized): Secondary | ICD-10-CM | POA: Diagnosis not present

## 2020-11-06 MED ORDER — METOPROLOL SUCCINATE ER 25 MG PO TB24: 25.0000 mg | ORAL_TABLET | Freq: Two times a day (BID) | ORAL | 3 refills | Status: DC

## 2020-11-06 NOTE — Telephone Encounter (Signed)
   Patient seeking advice on adjusting her blood pressure medication. She states the last few days her bp has been a little higher than normal (183/90) after recent colonscopy Declined appointment. Seeking advice

## 2020-11-06 NOTE — Telephone Encounter (Signed)
Increase Toprol-XL to 25 mg twice a day.  Please see me with blood pressure readings in 2-4 weeks.  Thanks

## 2020-11-07 ENCOUNTER — Telehealth: Payer: Self-pay

## 2020-11-07 NOTE — Telephone Encounter (Signed)
  Follow up Call-  Call back number 11/05/2020  Post procedure Call Back phone  # [phone]  Permission to leave phone message Yes  Some recent data might be hidden     Patient questions:  Do you have a fever, pain , or abdominal swelling? No. Pain Score  0 *  Have you tolerated food without any problems? Yes.    Have you been able to return to your normal activities? Yes.    Do you have any questions about your discharge instructions: Diet   No. Medications  No. Follow up visit  No.  Do you have questions or concerns about your Care? No.  Actions: * If pain score is 4 or above: No action needed, pain <4.   1. Have you developed a fever since your procedure? No   2.   Have you had an respiratory symptoms (SOB or cough) since your procedure? No   3.   Have you tested positive for COVID 19 since your procedure? No   4.   Have you had any family members/close contacts diagnosed with the COVID 19 since your procedure?  No    If yes to any of these questions please route to Joylene John, RN and Joella Prince, RN

## 2020-11-07 NOTE — Telephone Encounter (Signed)
Notified pt w/MD response. Made f/yu appt for 3/8 @ 2:20,,,/lmb

## 2020-11-10 DIAGNOSIS — M6281 Muscle weakness (generalized): Secondary | ICD-10-CM | POA: Diagnosis not present

## 2020-11-10 DIAGNOSIS — M7501 Adhesive capsulitis of right shoulder: Secondary | ICD-10-CM | POA: Diagnosis not present

## 2020-11-10 DIAGNOSIS — M25611 Stiffness of right shoulder, not elsewhere classified: Secondary | ICD-10-CM | POA: Diagnosis not present

## 2020-11-14 DIAGNOSIS — M25611 Stiffness of right shoulder, not elsewhere classified: Secondary | ICD-10-CM | POA: Diagnosis not present

## 2020-11-14 DIAGNOSIS — M6281 Muscle weakness (generalized): Secondary | ICD-10-CM | POA: Diagnosis not present

## 2020-11-14 DIAGNOSIS — M7501 Adhesive capsulitis of right shoulder: Secondary | ICD-10-CM | POA: Diagnosis not present

## 2020-11-17 ENCOUNTER — Telehealth: Payer: Self-pay | Admitting: Gastroenterology

## 2020-11-17 ENCOUNTER — Other Ambulatory Visit: Payer: Self-pay

## 2020-11-17 DIAGNOSIS — D123 Benign neoplasm of transverse colon: Secondary | ICD-10-CM

## 2020-11-17 MED ORDER — PEG 3350-KCL-NA BICARB-NACL 420 G PO SOLR: 4000.0000 mL | Freq: Once | ORAL | 0 refills | Status: AC

## 2020-11-17 NOTE — Telephone Encounter (Signed)
Pt is requesting a call back regarding her pathology report.

## 2020-11-17 NOTE — Telephone Encounter (Signed)
See results note 3/7

## 2020-11-18 ENCOUNTER — Other Ambulatory Visit: Payer: Self-pay

## 2020-11-18 ENCOUNTER — Encounter: Payer: Self-pay | Admitting: Internal Medicine

## 2020-11-18 ENCOUNTER — Ambulatory Visit (INDEPENDENT_AMBULATORY_CARE_PROVIDER_SITE_OTHER): Payer: Medicare Other | Admitting: Internal Medicine

## 2020-11-18 DIAGNOSIS — D122 Benign neoplasm of ascending colon: Secondary | ICD-10-CM

## 2020-11-18 DIAGNOSIS — I10 Essential (primary) hypertension: Secondary | ICD-10-CM | POA: Diagnosis not present

## 2020-11-18 DIAGNOSIS — F419 Anxiety disorder, unspecified: Secondary | ICD-10-CM

## 2020-11-18 MED ORDER — METOPROLOL SUCCINATE ER 25 MG PO TB24: 25.0000 mg | ORAL_TABLET | Freq: Two times a day (BID) | ORAL | 3 refills | Status: DC

## 2020-11-18 NOTE — Assessment & Plan Note (Signed)
Alprazolam as needed-infrequent use  Potential benefits of a long term benzodiazepines  use as well as potential risks  and complications were explained to the patient and were aknowledged.

## 2020-11-18 NOTE — Progress Notes (Signed)
Subjective:  Patient ID: Sue [name], female    DOB: September 09, 1951  Age: 70 y.o. MRN: [id number]  CC: Hypertension (F/U ON BP)   HPI Sue [name] presents for elevated BP, large colon polyp - OK bx 18 mm - another colonoscopy is pending in May F/u on HTN, anxiety  Outpatient Medications Prior to Visit  Medication Sig Dispense Refill   acetaminophen (TYLENOL) 325 MG tablet Take 2 tablets (650 mg total) by mouth every 6 (six) hours as needed for mild pain (or temp > 100).     ALPRAZolam (XANAX) 0.25 MG tablet TAKE 1 TABLET BY MOUTH 3 TIMES A DAY AS NEEDED FOR ANXIETY *OFFICE VISIT NEEDED BEFOREA ANY REFILLS* 90 tablet 1   diclofenac (VOLTAREN) 75 MG EC tablet Take 1 tablet (75 mg total) by mouth 2 (two) times daily as needed. 60 tablet 1   Esterified Estrogens (MENEST) 0.3 MG tablet TAKE 1 TABLET (0.3 MG TOTAL) BY MOUTH DAILY. 90 tablet 4   ESTRACE VAGINAL 0.1 MG/GM vaginal cream Place 5.40 Applicatorfuls vaginally once a week. 42.5 g 2   metoprolol succinate (TOPROL-XL) 25 MG 24 hr tablet Take 1 tablet (25 mg total) by mouth in the morning and at bedtime. 180 tablet 3   MOBIC 7.5 MG tablet Take 1 tablet by mouth once a day as needed for pain take with gastric protection     Multiple Vitamin (MULTIVITAMIN) capsule Take 1 capsule by mouth daily.     ondansetron (ZOFRAN) 4 MG tablet As directed for use with colonoscopy prep 4 tablet 0   OVER THE COUNTER MEDICATION Take 1 tablet by mouth 2 (two) times daily.     potassium chloride (KLOR-CON) 8 MEQ tablet Take 1 tablet (8 mEq total) by mouth daily. 90 tablet 2   Potassium Gluconate 550 MG TABS Take 1 tablet by mouth daily.     triamterene-hydrochlorothiazide (MAXZIDE-25) 37.5-25 MG tablet Take 1 tablet by mouth daily. 90 tablet 1   XIIDRA 5 % SOLN Place 1 drop into both eyes 2 (two) times daily.  2   zolpidem (AMBIEN) 5 MG tablet TAKE 1 TABLET BY MOUTH AT BEDTIME AS NEEDED FOR SLEEP. Do not take w/alprazolam 90 tablet 1   No  facility-administered medications prior to visit.    ROS: Review of Systems  Constitutional: Negative for activity change, appetite change, chills, fatigue and unexpected weight change.  HENT: Negative for congestion, mouth sores and sinus pressure.   Eyes: Negative for visual disturbance.  Respiratory: Negative for cough and chest tightness.   Gastrointestinal: Negative for abdominal pain and nausea.  Genitourinary: Negative for difficulty urinating, frequency and vaginal pain.  Musculoskeletal: Negative for back pain and gait problem.  Skin: Negative for pallor and rash.  Neurological: Negative for dizziness, tremors, weakness, numbness and headaches.  Psychiatric/Behavioral: Negative for confusion, sleep disturbance and suicidal ideas.    Objective:  BP (!) 146/80 (BP Location: Left Arm)    Pulse 65    Temp 98 F (36.7 C) (Oral)    Wt 132 lb (59.9 kg)    SpO2 97%    BMI 22.66 kg/m   BP Readings from Last 3 Encounters:  11/18/20 (!) 146/80  11/05/20 (!) 200/102  09/19/20 130/70    Wt Readings from Last 3 Encounters:  11/18/20 132 lb (59.9 kg)  11/05/20 133 lb (60.3 kg)  09/19/20 133 lb (60.3 kg)    Physical Exam Constitutional:      General: She is not in acute distress.  Appearance: She is well-developed.  HENT:     Head: Normocephalic.     Right Ear: External ear normal.     Left Ear: External ear normal.     Nose: Nose normal.     Mouth/Throat:     Mouth: Oropharynx is clear and moist.  Eyes:     General:        Right eye: No discharge.        Left eye: No discharge.     Conjunctiva/sclera: Conjunctivae normal.     Pupils: Pupils are equal, round, and reactive to light.  Neck:     Thyroid: No thyromegaly.     Vascular: No JVD.     Trachea: No tracheal deviation.  Cardiovascular:     Rate and Rhythm: Normal rate and regular rhythm.     Heart sounds: Normal heart sounds.  Pulmonary:     Effort: No respiratory distress.     Breath sounds: No stridor. No  wheezing.  Abdominal:     General: Bowel sounds are normal. There is no distension.     Palpations: Abdomen is soft. There is no mass.     Tenderness: There is no abdominal tenderness. There is no guarding or rebound.  Musculoskeletal:        General: Tenderness present. No edema.     Cervical back: Normal range of motion and neck supple.  Lymphadenopathy:     Cervical: No cervical adenopathy.  Skin:    Findings: No erythema or rash.  Neurological:     Cranial Nerves: No cranial nerve deficit.     Motor: No abnormal muscle tone.     Coordination: Coordination normal.     Deep Tendon Reflexes: Reflexes normal.  Psychiatric:        Mood and Affect: Mood and affect normal.        Behavior: Behavior normal.        Thought Content: Thought content normal.        Judgment: Judgment normal.      Lab Results  Component Value Date   WBC 6.5 07/14/2020   HGB 14.0 07/14/2020   HCT 41.5 07/14/2020   PLT 289.0 07/14/2020   GLUCOSE 87 07/14/2020   CHOL 208 (H) 07/14/2020   TRIG 66.0 07/14/2020   HDL 68.30 07/14/2020   LDLDIRECT 118.2 07/19/2013   LDLCALC 126 (H) 07/14/2020   ALT 26 07/14/2020   AST 22 07/14/2020   NA 139 07/14/2020   K 4.0 07/14/2020   CL 100 07/14/2020   CREATININE 0.86 07/14/2020   BUN 19 07/14/2020   CO2 31 07/14/2020   TSH 3.09 07/14/2020    MM 3D SCREEN BREAST BILATERAL  Result Date: 09/11/2020 CLINICAL DATA:  Screening. EXAM: DIGITAL SCREENING BILATERAL MAMMOGRAM WITH TOMO AND CAD COMPARISON:  Previous exam(s). ACR Breast Density Category c: The breast tissue is heterogeneously dense, which may obscure small masses. FINDINGS: There are no findings suspicious for malignancy. Images were processed with CAD. IMPRESSION: No mammographic evidence of malignancy. A result letter of this screening mammogram will be mailed directly to the patient. RECOMMENDATION: Screening mammogram in one year. (Code:SM-B-01Y) BI-RADS CATEGORY  1: Negative. Electronically Signed    By: Dorise Bullion III M.D   On: 09/11/2020 11:00    Assessment & Plan:    Walker Kehr, MD

## 2020-11-19 ENCOUNTER — Ambulatory Visit (INDEPENDENT_AMBULATORY_CARE_PROVIDER_SITE_OTHER): Payer: Medicare Other

## 2020-11-19 VITALS — Ht 64.0 in

## 2020-11-19 DIAGNOSIS — Z0000 Encounter for general adult medical examination without abnormal findings: Secondary | ICD-10-CM

## 2020-11-19 NOTE — Patient Instructions (Signed)
Sue [name] , Thank you for taking time to come for your Medicare Wellness Visit. I appreciate your ongoing commitment to your health goals. Please review the following plan we discussed and let me know if I can assist you in the future.   Screening recommendations/referrals: Colonoscopy: 11/05/2020; will repeat in 4 months per patient Mammogram: 09/11/2020 Bone Density: 11/03/2016; normal results Recommended yearly ophthalmology/optometry visit for glaucoma screening and checkup Recommended yearly dental visit for hygiene and checkup  Vaccinations: Influenza vaccine: declined   Pneumococcal vaccine: declined Tdap vaccine: 06/16/2019; due every 10 years Shingles vaccine: declined   Covid-19: declined  Advanced directives: Please bring a copy of your health care power of attorney and living will to the office at your convenience.  Conditions/risks identified: Yes; Reviewed health maintenance screenings with patient today and relevant education, vaccines, and/or referrals were provided. Please continue to do your personal lifestyle choices by: daily care of teeth and gums, regular physical activity (goal should be 5 days a week for 30 minutes), eat a healthy diet, avoid tobacco and drug use, limiting any alcohol intake, taking a low-dose aspirin (if not allergic or have been advised by your provider otherwise) and taking vitamins and minerals as recommended by your provider. Continue doing brain stimulating activities (puzzles, reading, adult coloring books, staying active) to keep memory sharp. Continue to eat heart healthy diet (full of fruits, vegetables, whole grains, lean protein, water--limit salt, fat, and sugar intake) and increase physical activity as tolerated.  Next appointment: Please schedule your next Medicare Wellness Visit with your Nurse Health Advisor in 1 year by calling [phone].   Preventive Care 3 Years and Older, Female Preventive care refers to lifestyle choices and  visits with your health care provider that can promote health and wellness. What does preventive care include?  A yearly physical exam. This is also called an annual well check.  Dental exams once or twice a year.  Routine eye exams. Ask your health care provider how often you should have your eyes checked.  Personal lifestyle choices, including:  Daily care of your teeth and gums.  Regular physical activity.  Eating a healthy diet.  Avoiding tobacco and drug use.  Limiting alcohol use.  Practicing safe sex.  Taking low-dose aspirin every day.  Taking vitamin and mineral supplements as recommended by your health care provider. What happens during an annual well check? The services and screenings done by your health care provider during your annual well check will depend on your age, overall health, lifestyle risk factors, and family history of disease. Counseling  Your health care provider may ask you questions about your:  Alcohol use.  Tobacco use.  Drug use.  Emotional well-being.  Home and relationship well-being.  Sexual activity.  Eating habits.  History of falls.  Memory and ability to understand (cognition).  Work and work Statistician.  Reproductive health. Screening  You may have the following tests or measurements:  Height, weight, and BMI.  Blood pressure.  Lipid and cholesterol levels. These may be checked every 5 years, or more frequently if you are over 6 years old.  Skin check.  Lung cancer screening. You may have this screening every year starting at age 99 if you have a 30-pack-year history of smoking and currently smoke or have quit within the past 15 years.  Fecal occult blood test (FOBT) of the stool. You may have this test every year starting at age 69.  Flexible sigmoidoscopy or colonoscopy. You may have a sigmoidoscopy  every 5 years or a colonoscopy every 10 years starting at age 64.  Hepatitis C blood test.  Hepatitis B  blood test.  Sexually transmitted disease (STD) testing.  Diabetes screening. This is done by checking your blood sugar (glucose) after you have not eaten for a while (fasting). You may have this done every 1-3 years.  Bone density scan. This is done to screen for osteoporosis. You may have this done starting at age 59.  Mammogram. This may be done every 1-2 years. Talk to your health care provider about how often you should have regular mammograms. Talk with your health care provider about your test results, treatment options, and if necessary, the need for more tests. Vaccines  Your health care provider may recommend certain vaccines, such as:  Influenza vaccine. This is recommended every year.  Tetanus, diphtheria, and acellular pertussis (Tdap, Td) vaccine. You may need a Td booster every 10 years.  Zoster vaccine. You may need this after age 35.  Pneumococcal 13-valent conjugate (PCV13) vaccine. One dose is recommended after age 85.  Pneumococcal polysaccharide (PPSV23) vaccine. One dose is recommended after age 26. Talk to your health care provider about which screenings and vaccines you need and how often you need them. This information is not intended to replace advice given to you by your health care provider. Make sure you discuss any questions you have with your health care provider. Document Released: 09/26/2015 Document Revised: 05/19/2016 Document Reviewed: 07/01/2015 Elsevier Interactive Patient Education  2017 [place] [employer] Prevention in the Home Falls can cause injuries. They can happen to people of all ages. There are many things you can do to make your home safe and to help prevent falls. What can I do on the outside of my home?  Regularly fix the edges of walkways and driveways and fix any cracks.  Remove anything that might make you trip as you walk through a door, such as a raised step or threshold.  Trim any bushes or trees on the path to your  home.  Use bright outdoor lighting.  Clear any walking paths of anything that might make someone trip, such as rocks or tools.  Regularly check to see if handrails are loose or broken. Make sure that both sides of any steps have handrails.  Any raised decks and porches should have guardrails on the edges.  Have any leaves, snow, or ice cleared regularly.  Use sand or salt on walking paths during winter.  Clean up any spills in your garage right away. This includes oil or grease spills. What can I do in the bathroom?  Use night lights.  Install grab bars by the toilet and in the tub and shower. Do not use towel bars as grab bars.  Use non-skid mats or decals in the tub or shower.  If you need to sit down in the shower, use a plastic, non-slip stool.  Keep the floor dry. Clean up any water that spills on the floor as soon as it happens.  Remove soap buildup in the tub or shower regularly.  Attach bath mats securely with double-sided non-slip rug tape.  Do not have throw rugs and other things on the floor that can make you trip. What can I do in the bedroom?  Use night lights.  Make sure that you have a light by your bed that is easy to reach.  Do not use any sheets or blankets that are too big for your bed. They should  not hang down onto the floor.  Have a firm chair that has side arms. You can use this for support while you get dressed.  Do not have throw rugs and other things on the floor that can make you trip. What can I do in the kitchen?  Clean up any spills right away.  Avoid walking on wet floors.  Keep items that you use a lot in easy-to-reach places.  If you need to reach something above you, use a strong step stool that has a grab bar.  Keep electrical cords out of the way.  Do not use floor polish or wax that makes floors slippery. If you must use wax, use non-skid floor wax.  Do not have throw rugs and other things on the floor that can make you  trip. What can I do with my stairs?  Do not leave any items on the stairs.  Make sure that there are handrails on both sides of the stairs and use them. Fix handrails that are broken or loose. Make sure that handrails are as long as the stairways.  Check any carpeting to make sure that it is firmly attached to the stairs. Fix any carpet that is loose or worn.  Avoid having throw rugs at the top or bottom of the stairs. If you do have throw rugs, attach them to the floor with carpet tape.  Make sure that you have a light switch at the top of the stairs and the bottom of the stairs. If you do not have them, ask someone to add them for you. What else can I do to help prevent falls?  Wear shoes that:  Do not have high heels.  Have rubber bottoms.  Are comfortable and fit you well.  Are closed at the toe. Do not wear sandals.  If you use a stepladder:  Make sure that it is fully opened. Do not climb a closed stepladder.  Make sure that both sides of the stepladder are locked into place.  Ask someone to hold it for you, if possible.  Clearly mark and make sure that you can see:  Any grab bars or handrails.  First and last steps.  Where the edge of each step is.  Use tools that help you move around (mobility aids) if they are needed. These include:  Canes.  Walkers.  Scooters.  Crutches.  Turn on the lights when you go into a dark area. Replace any light bulbs as soon as they burn out.  Set up your furniture so you have a clear path. Avoid moving your furniture around.  If any of your floors are uneven, fix them.  If there are any pets around you, be aware of where they are.  Review your medicines with your doctor. Some medicines can make you feel dizzy. This can increase your chance of falling. Ask your doctor what other things that you can do to help prevent falls. This information is not intended to replace advice given to you by your health care provider. Make  sure you discuss any questions you have with your health care provider. Document Released: 06/26/2009 Document Revised: 02/05/2016 Document Reviewed: 10/04/2014 Elsevier Interactive Patient Education  2017 [employer].

## 2020-11-19 NOTE — Progress Notes (Addendum)
Subjective:   Sue [name] is a 70 y.o. female who presents for Medicare Annual (Subsequent) preventive examination.  Review of Systems    No ROS. Medicare Wellness Visit. Additional risk factors are reflected in social history. Cardiac Risk Factors include: advanced age (>39men, >97 women);dyslipidemia;hypertension;family history of premature cardiovascular disease     Objective:    Today's Vitals   11/19/20 1456  Height: 5\' 4"  (1.626 m)   Body mass index is 22.66 kg/m.  Advanced Directives 11/19/2020 07/31/2019 07/28/2016 07/22/2016 02/11/2016 08/16/2015  Does Patient Have a Medical Advance Directive? Yes Yes Yes Yes No No  Type of Advance Directive Living will;Healthcare Power of Attorney Living will;Healthcare Power of [place];Living will [place];Living will - -  Does patient want to make changes to medical advance directive? No - Patient declined No - Patient declined No - Patient declined - - -  Copy of [place] in Chart? No - copy requested - No - copy requested No - copy requested - -  Would patient like information on creating a medical advance directive? - - - - - No - patient declined information    Current Medications (verified) Outpatient Encounter Medications as of 11/19/2020  Medication Sig   acetaminophen (TYLENOL) 325 MG tablet Take 2 tablets (650 mg total) by mouth every 6 (six) hours as needed for mild pain (or temp > 100).   ALPRAZolam (XANAX) 0.25 MG tablet TAKE 1 TABLET BY MOUTH 3 TIMES A DAY AS NEEDED FOR ANXIETY *OFFICE VISIT NEEDED BEFOREA ANY REFILLS*   diclofenac (VOLTAREN) 75 MG EC tablet Take 1 tablet (75 mg total) by mouth 2 (two) times daily as needed.   Esterified Estrogens (MENEST) 0.3 MG tablet TAKE 1 TABLET (0.3 MG TOTAL) BY MOUTH DAILY.   ESTRACE VAGINAL 0.1 MG/GM vaginal cream Place 9.79 Applicatorfuls vaginally once a week.   metoprolol succinate (TOPROL-XL) 25 MG 24 hr  tablet Take 1 tablet (25 mg total) by mouth in the morning and at bedtime.   MOBIC 7.5 MG tablet Take 1 tablet by mouth once a day as needed for pain take with gastric protection   Multiple Vitamin (MULTIVITAMIN) capsule Take 1 capsule by mouth daily.   ondansetron (ZOFRAN) 4 MG tablet As directed for use with colonoscopy prep   OVER THE COUNTER MEDICATION Take 1 tablet by mouth 2 (two) times daily.   potassium chloride (KLOR-CON) 8 MEQ tablet Take 1 tablet (8 mEq total) by mouth daily.   Potassium Gluconate 550 MG TABS Take 1 tablet by mouth daily.   triamterene-hydrochlorothiazide (MAXZIDE-25) 37.5-25 MG tablet Take 1 tablet by mouth daily.   XIIDRA 5 % SOLN Place 1 drop into both eyes 2 (two) times daily.   zolpidem (AMBIEN) 5 MG tablet TAKE 1 TABLET BY MOUTH AT BEDTIME AS NEEDED FOR SLEEP. Do not take w/alprazolam   No facility-administered encounter medications on file as of 11/19/2020.    Allergies (verified) Clindamycin, Aspirin, Fish oil, Latex, and Penicillins   History: Past Medical History:  Diagnosis Date   Cataract    Diverticulitis    FRACTURE, ARM, LEFT ~ [place], FOOT ~ [place], INDEX FINGER, LEFT ~ 1960   Heart murmur    HYPERTENSION 11/15/2007   INSOMNIA, CHRONIC 06/17/2009   MITRAL VALVE PROLAPSE, HX OF    OSTEOARTHRITIS 11/15/2007   Subdural hematoma ([place]) 03/2015   S/P fall   Past Surgical History:  Procedure  Laterality Date   CATARACT EXTRACTION W/ INTRAOCULAR LENS IMPLANT Left 01/2015   COLON RESECTION N/A 07/28/2016   Procedure: LAPAROSCOPIC ASSISTED  COLOSTOMY TAKEDOWN WITH COLORECTAL ANASTOMOSIS;  Surgeon: Arta Bruce Kinsinger, MD;  Location: [facility];  Service: General;  Laterality: N/A;   COLON SURGERY     COLONOSCOPY     COLOSTOMY TAKEDOWN  07/28/2016   laparoscopic converted to open take down of end colostomy with colo-rectostomy/notes 07/28/2016   PARTIAL COLECTOMY N/A 02/11/2016   Procedure: PARTIAL COLECTOMY with colostomy;  Surgeon:  Arta Bruce Kinsinger, MD;  Location: [facility];  Service: General;  Laterality: N/A;   TONSILLECTOMY     Family History  Problem Relation Age of Onset   Breast cancer Mother    Hypertension Father    Cancer Father        LUNG   Parkinsonism Other    Colon cancer Neg Hx    Esophageal cancer Neg Hx    Colon polyps Neg Hx    Stomach cancer Neg Hx    Social History   Socioeconomic History   Marital status: Divorced    Spouse name: Not on file   Number of children: 2   Years of education: Not on file   Highest education level: Not on file  Occupational History   Occupation: Home maker  Tobacco Use   Smoking status: Never Smoker   Smokeless tobacco: Never Used  Scientific laboratory technician Use: Never used  Substance and Sexual Activity   Alcohol use: Yes    Alcohol/week: 1.0 standard drink    Types: 1 Glasses of wine per week    Comment: RARE   Drug use: No   Sexual activity: Never    Comment: 1st intercourse- 20,  Other Topics Concern   Not on file  Social History Narrative   [place], [place]   Married '72-17 yrs then divorced; remarried '90 - 4 yrs divorced. 1 boy - '75; 1 daughter '83. Single partner since 2001. Socially active, riding horses not hunting but jumping little jumps. Lost her mother Spring '12 - doing OK with loss.   Social Determinants of Health   Financial Resource Strain: Low Risk    Difficulty of Paying Living Expenses: Not hard at all  Food Insecurity: No Food Insecurity   Worried About Charity fundraiser in the Last Year: Never true   [place] in the Last Year: Never true  Transportation Needs: No Transportation Needs   Lack of Transportation (Medical): No   Lack of Transportation (Non-Medical): No  Physical Activity: Sufficiently Active   Days of Exercise per Week: 7 days   Minutes of Exercise per Session: 30 min  Stress: No Stress Concern Present   Feeling of Stress : Not at all  Social Connections: Moderately Integrated   Frequency of  Communication with Friends and Family: More than three times a week   Frequency of Social Gatherings with Friends and Family: More than three times a week   Attends Religious Services: More than 4 times per year   Active Member of [employer] or Organizations: Yes   Attends Music therapist: More than 4 times per year   Marital Status: Divorced    Tobacco Counseling Counseling given: Not Answered   Clinical Intake:  Pre-visit preparation completed: Yes  Pain : No/denies pain     BMI - recorded: 22.66 Nutritional Status: BMI of 19-24  Normal Nutritional Risks: None Diabetes: No  How often do  you need to have someone help you when you read instructions, pamphlets, or other written materials from your doctor or pharmacy?: 1 - Never What is the last grade level you completed in school?: College Graduate  Diabetic? no  Interpreter Needed?: No  Information entered by :: Lisette Abu, LPN   Activities of Daily Living In your present state of health, do you have any difficulty performing the following activities: 11/19/2020  Hearing? N  Vision? N  Difficulty concentrating or making decisions? N  Walking or climbing stairs? N  Dressing or bathing? N  Doing errands, shopping? N  Preparing Food and eating ? N  Using the Toilet? N  In the past six months, have you accidently leaked urine? N  Do you have problems with loss of bowel control? N  Managing your Medications? N  Managing your Finances? N  Housekeeping or managing your Housekeeping? N  Some recent data might be hidden    Patient Care Team: Plotnikov, Evie Lacks, MD as PCP - General (Internal Medicine) Kinsinger, Arta Bruce, MD as Consulting Physician (General Surgery)  Indicate any recent Medical Services you may have received from other than [facility] providers in the past year (date may be approximate).     Assessment:   This is a routine wellness examination for Lucillie.  Hearing/Vision screen No  exam data present  Dietary issues and exercise activities discussed: Current Exercise Habits: Home exercise routine, Type of exercise: walking;Other - see comments (plays tennis, pickle ball, enjoys riding horses and playing golf), Time (Minutes): 30, Frequency (Times/Week): 7, Weekly Exercise (Minutes/Week): 210, Intensity: Moderate, Exercise limited by: None identified  Goals   None    Depression Screen PHQ 2/9 Scores 11/19/2020 10/23/2019 06/07/2017 08/22/2015  PHQ - 2 Score 0 0 0 0    Fall Risk Fall Risk  11/19/2020 10/23/2019 07/17/2018 06/07/2017 08/22/2015  Falls in the past year? 0 0 0 No Yes  Number falls in past yr: 0 0 - - 2 or more  Injury with Fall? 0 0 - - Yes  Risk for fall due to : No Fall Risks - - - -  Follow up Falls evaluation completed - - - -    FALL RISK PREVENTION PERTAINING TO THE HOME:  Any stairs in or around the home? Yes  If so, are there any without handrails? No  Home free of loose throw rugs in walkways, pet beds, electrical cords, etc? Yes  Adequate lighting in your home to reduce risk of falls? Yes   ASSISTIVE DEVICES UTILIZED TO PREVENT FALLS:  Life alert? No  Use of a cane, walker or w/c? No  Grab bars in the bathroom? No  Shower chair or bench in shower? No  Elevated toilet seat or a handicapped toilet? Yes   TIMED UP AND GO:  Was the test performed? No .  Length of time to ambulate 10 feet: 0 sec.   Gait steady and fast without use of assistive device  Cognitive Function: No flowsheet data found.         Immunizations Immunization History  Administered Date(s) Administered   Td 09/15/2006   Tdap 06/16/2019    TDAP status: Up to date  Flu Vaccine status: Declined, Education has been provided regarding the importance of this vaccine but patient still declined. Advised may receive this vaccine at local pharmacy or Health Dept. Aware to provide a copy of the vaccination record if obtained from local pharmacy or Health Dept. Verbalized  acceptance and understanding.  Pneumococcal vaccine status: Declined,  Education has been provided regarding the importance of this vaccine but patient still declined. Advised may receive this vaccine at local pharmacy or Health Dept. Aware to provide a copy of the vaccination record if obtained from local pharmacy or Health Dept. Verbalized acceptance and understanding.   Covid-19 vaccine status: Declined, Education has been provided regarding the importance of this vaccine but patient still declined. Advised may receive this vaccine at local pharmacy or Health Dept.or vaccine clinic. Aware to provide a copy of the vaccination record if obtained from local pharmacy or Health Dept. Verbalized acceptance and understanding.  Qualifies for Shingles Vaccine? Yes   Zostavax completed No   Shingrix Completed?: No.    Education has been provided regarding the importance of this vaccine. Patient has been advised to call insurance company to determine out of pocket expense if they have not yet received this vaccine. Advised may also receive vaccine at local pharmacy or Health Dept. Verbalized acceptance and understanding.  Screening Tests Health Maintenance  Topic Date Due   PNA vac Low Risk Adult (1 of 2 - PCV13) Never done   COVID-19 Vaccine (1) 12/04/2020 (Originally 12/29/1955)   INFLUENZA VACCINE  12/11/2020 (Originally 04/13/2020)   MAMMOGRAM  09/11/2022   Fecal DNA (Cologuard)  08/19/2023   TETANUS/TDAP  06/15/2029   DEXA SCAN  Completed   Hepatitis C Screening  Completed   HPV VACCINES  Aged Out    Health Maintenance  Health Maintenance Due  Topic Date Due   PNA vac Low Risk Adult (1 of 2 - PCV13) Never done    Colorectal cancer screening: Type of screening: Colonoscopy. Completed 11/05/2020. Repeat every ? years (per patient will be having repeat screening between May-August 2022)  Mammogram status: Completed 09/11/2020. Repeat every year  Bone Density status: Completed 11/03/2016.  Results reflect: Bone density results: NORMAL. Repeat every 5 years.  Lung Cancer Screening: (Low Dose CT Chest recommended if Age 9-80 years, 30 pack-year currently smoking OR have quit w/in 15years.) does not qualify.   Lung Cancer Screening Referral: no  Additional Screening:  Hepatitis C Screening: does qualify; Completed yes  Vision Screening: Recommended annual ophthalmology exams for early detection of glaucoma and other disorders of the eye. Is the patient up to date with their annual eye exam?  Yes  Who is the provider or what is the name of the office in which the patient attends annual eye exams? [facility] If pt is not established with a provider, would they like to be referred to a provider to establish care? No .   Dental Screening: Recommended annual dental exams for proper oral hygiene  Community Resource Referral / Chronic Care Management: CRR required this visit?  No   CCM required this visit?  No      Plan:     I have personally reviewed and noted the following in the patient's chart:   Medical and social history Use of alcohol, tobacco or illicit drugs  Current medications and supplements Functional ability and status Nutritional status Physical activity Advanced directives List of other physicians Hospitalizations, surgeries, and ER visits in previous 12 months Vitals Screenings to include cognitive, depression, and falls Referrals and appointments  In addition, I have reviewed and discussed with patient certain preventive protocols, quality metrics, and best practice recommendations. A written personalized care plan for preventive services as well as general preventive health recommendations were provided to patient.     Sheral Flow, LPN  11/19/2020   Nurse Notes:  Patient is cogitatively intact. There were no vitals filed for this visit. There is no height or weight on file to calculate BMI. Patient stated that she has no  issues with gait or balance; does not use any assistive devices. Medications reviewed with patient; no opioid use noted.   Medical screening examination/treatment/procedure(s) were performed by non-physician practitioner and as supervising physician I was immediately available for consultation/collaboration.  I agree with above. Lew Dawes, MD

## 2020-11-24 ENCOUNTER — Telehealth: Payer: Self-pay | Admitting: Gastroenterology

## 2020-11-24 DIAGNOSIS — Z1159 Encounter for screening for other viral diseases: Secondary | ICD-10-CM | POA: Diagnosis not present

## 2020-11-24 NOTE — Telephone Encounter (Signed)
Pt states Dr. Silverio Decamp was going to look at her hemorrhoids at the time of her procedure regarding banding. She is calling to see if that was an option for her. Please advise.

## 2020-11-24 NOTE — Telephone Encounter (Signed)
Inbound call from patient requesting a call back please to discuss hemorrhoid banding.

## 2020-11-24 NOTE — Telephone Encounter (Signed)
Left message for pt to call back  °

## 2020-11-25 NOTE — Telephone Encounter (Signed)
Pt scheduled to see Dr. Silverio Decamp for possible banding 12/01/20@3 :50pm. Left message for pt regarding appt.

## 2020-11-25 NOTE — Telephone Encounter (Signed)
Please schedule office visit for hemorrhoidal band ligation, next available appointment.  Thank you

## 2020-11-27 DIAGNOSIS — H938X3 Other specified disorders of ear, bilateral: Secondary | ICD-10-CM | POA: Diagnosis not present

## 2020-11-27 DIAGNOSIS — H6123 Impacted cerumen, bilateral: Secondary | ICD-10-CM | POA: Insufficient documentation

## 2020-12-01 ENCOUNTER — Encounter: Payer: Medicare Other | Admitting: Gastroenterology

## 2020-12-10 ENCOUNTER — Encounter: Payer: Self-pay | Admitting: Gastroenterology

## 2020-12-10 ENCOUNTER — Ambulatory Visit (INDEPENDENT_AMBULATORY_CARE_PROVIDER_SITE_OTHER): Payer: Medicare Other | Admitting: Gastroenterology

## 2020-12-10 VITALS — BP 136/62 | HR 59 | Ht 64.0 in | Wt 135.0 lb

## 2020-12-10 DIAGNOSIS — K641 Second degree hemorrhoids: Secondary | ICD-10-CM

## 2020-12-10 NOTE — Progress Notes (Signed)
PROCEDURE NOTE: The patient presents with symptomatic grade II  hemorrhoids, requesting rubber band ligation of his/her hemorrhoidal disease.  All risks, benefits and alternative forms of therapy were described and informed consent was obtained.  In the Left Lateral Decubitus position anoscopic examination revealed grade II hemorrhoids in the left lateral, right anterior and right posterior position(s).  The anorectum was pre-medicated with 0.125% nitroglycerin and RectiCare The decision was made to band the left lateral internal hemorrhoid, and the [place] was used to perform band ligation without complication.  Digital anorectal examination was then performed to assure proper positioning of the band, and to adjust the banded tissue as required.  The patient was discharged home without pain or other issues.  Dietary and behavioral recommendations were given and along with follow-up instructions.     The following adjunctive treatments were recommended:  Use soluble fiber 1 tablet 3 times daily with meals Increase water intake to 8 to 10 cups daily  The patient will return in 2 to 4 weeks for  follow-up and possible additional banding as required. No complications were encountered and the patient tolerated the procedure well.  Damaris Hippo , MD [phone]

## 2020-12-10 NOTE — Patient Instructions (Addendum)
HEMORRHOID BANDING PROCEDURE    FOLLOW-UP CARE   1. The procedure you have had should have been relatively painless since the banding of the area involved does not have nerve endings and there is no pain sensation.  The rubber band cuts off the blood supply to the hemorrhoid and the band may fall off as soon as 48 hours after the banding (the band may occasionally be seen in the toilet bowl following a bowel movement). You may notice a temporary feeling of fullness in the rectum which should respond adequately to plain Tylenol or Motrin.  2. Following the banding, avoid strenuous exercise that evening and resume full activity the next day.  A sitz bath (soaking in a warm tub) or bidet is soothing, and can be useful for cleansing the area after bowel movements.     3. To avoid constipation, take two tablespoons of natural wheat bran, natural oat bran, flax, Benefiber or any over the counter fiber supplement and increase your water intake to 7-8 glasses daily.    4. Unless you have been prescribed anorectal medication, do not put anything inside your rectum for two weeks: No suppositories, enemas, fingers, etc.  5. Occasionally, you may have more bleeding than usual after the banding procedure.  This is often from the untreated hemorrhoids rather than the treated one.  Don't be concerned if there is a tablespoon or so of blood.  If there is more blood than this, lie flat with your bottom higher than your head and apply an ice pack to the area. If the bleeding does not stop within a half an hour or if you feel faint, call our office at [phone] or go to the emergency room.  6. Problems are not common; however, if there is a substantial amount of bleeding, severe pain, chills, fever or difficulty passing urine (very rare) or other problems, you should call us at [phone] or report to the nearest emergency room.  7. Do not stay seated continuously for more than 2-3 hours for a day or two  after the procedure.  Tighten your buttock muscles 10-15 times every two hours and take 10-15 deep breaths every 1-2 hours.  Do not spend more than a few minutes on the toilet if you cannot empty your bowel; instead re-visit the toilet at a later time.  Switch to soluble fiber (Benefiber) and increase your water intake     Due to recent changes in healthcare laws, you may see the results of your imaging and laboratory studies on MyChart before your provider has had a chance to review them.  We understand that in some cases there may be results that are confusing or concerning to you. Not all laboratory results come back in the same time frame and the provider may be waiting for multiple results in order to interpret others.  Please give [place] 48 hours in order for your provider to thoroughly review all the results before contacting the office for clarification of your results.   I appreciate the  opportunity to care for you  Thank You   Harl Bowie , MD

## 2020-12-18 DIAGNOSIS — Z85828 Personal history of other malignant neoplasm of skin: Secondary | ICD-10-CM | POA: Diagnosis not present

## 2020-12-18 DIAGNOSIS — L820 Inflamed seborrheic keratosis: Secondary | ICD-10-CM | POA: Diagnosis not present

## 2020-12-19 ENCOUNTER — Other Ambulatory Visit (INDEPENDENT_AMBULATORY_CARE_PROVIDER_SITE_OTHER): Payer: Medicare Other

## 2020-12-19 ENCOUNTER — Encounter: Payer: Self-pay | Admitting: Gastroenterology

## 2020-12-19 ENCOUNTER — Ambulatory Visit (INDEPENDENT_AMBULATORY_CARE_PROVIDER_SITE_OTHER): Payer: Medicare Other | Admitting: Gastroenterology

## 2020-12-19 VITALS — BP 134/70 | HR 64 | Ht 64.0 in | Wt 136.2 lb

## 2020-12-19 DIAGNOSIS — D122 Benign neoplasm of ascending colon: Secondary | ICD-10-CM | POA: Diagnosis not present

## 2020-12-19 DIAGNOSIS — Z860100 Personal history of colon polyps, unspecified: Secondary | ICD-10-CM

## 2020-12-19 LAB — BASIC METABOLIC PANEL
BUN: 19 mg/dL (ref 6–23)
CO2: 32 mEq/L (ref 19–32)
Calcium: 10.2 mg/dL (ref 8.4–10.5)
Chloride: 99 mEq/L (ref 96–112)
Creatinine, Ser: 0.72 mg/dL (ref 0.40–1.20)
GFR: 83.79 mL/min (ref >60.00)
Glucose, Bld: 84 mg/dL (ref 70–99)
Potassium: 4.1 mEq/L (ref 3.5–5.1)
Sodium: 138 mEq/L (ref 135–145)

## 2020-12-19 LAB — CBC
HCT: 44.8 % (ref 36.0–46.0)
Hemoglobin: 15 g/dL (ref 12.0–15.0)
MCHC: 33.5 g/dL (ref 30.0–36.0)
MCV: 91.6 fl (ref 78.0–100.0)
Platelets: 316 10*3/uL (ref 150.0–400.0)
RBC: 4.89 Mil/uL (ref 3.87–5.11)
RDW: 14.1 % (ref 11.5–15.5)
WBC: 8.3 10*3/uL (ref 4.0–10.5)

## 2020-12-19 LAB — PROTIME-INR
INR: 1 ratio (ref 0.8–1.0)
Prothrombin Time: 11.1 s (ref 9.6–13.1)

## 2020-12-19 MED ORDER — SUTAB 1479-225-188 MG PO TABS: 24.0000 | ORAL_TABLET | ORAL | 0 refills | Status: DC

## 2020-12-19 NOTE — Patient Instructions (Signed)
Your provider has requested that you go to the basement level for lab work before leaving today. Press "B" on the elevator. The lab is located at the first door on the left as you exit the elevator.   We have sent the following medications to your pharmacy for you to pick up at your convenience: Sutab  Continue Senna daily.   Start Dulcolax 10mg  -Take 1 capsule daily for 3 days prior to colonoscopy.   If you are age 72 or older, your body mass index should be between 23-30. Your Body mass index is 23.39 kg/m. If this is out of the aforementioned range listed, please consider follow up with your Primary Care Provider.  You have been scheduled for a colonoscopy. Please follow written instructions given to you at your visit today.  Please pick up your prep supplies at the pharmacy within the next 1-3 days. If you use inhalers (even only as needed), please bring them with you on the day of your procedure.   Due to recent changes in healthcare laws, you may see the results of your imaging and laboratory studies on MyChart before your provider has had a chance to review them.  We understand that in some cases there may be results that are confusing or concerning to you. Not all laboratory results come back in the same time frame and the provider may be waiting for multiple results in order to interpret others.  Please give [place] 48 hours in order for your provider to thoroughly review all the results before contacting the office for clarification of your results.   Thank you for choosing me and [facility].  Dr. Rush Landmark

## 2020-12-20 NOTE — Progress Notes (Signed)
[place] VISIT   Primary Care Provider Plotnikov, Evie Lacks, MD [place] [address] [phone]  Referring Provider Dr. Silverio Decamp  Patient Profile: Sue [name] is a 70 y.o. female with a pmh significant for HTN, mitral valve prolapse, prior subdural hematoma, diverticulosis (history of prior diverticulitis and prior resection), colon polyps (SSPs).  The patient presents to the [facility] for an evaluation and management of problem(s) noted below:  Problem List 1. History of colon polyps   2. Adenomatous polyp of ascending colon     History of Present Illness Please see initial consultation note by Dr. Silverio Decamp for full details of HPI.  Interval History The patient recently underwent a screening colonoscopy in February.  She was found to have multiple small colon polyps as well as a large ascending colon polypoid lesion with central depression.  Biopsies of this showed evidence of SSP but no high-grade dysplasia.  It is for this reason that the patient is referred for consideration of advanced resection attempt rather than surgical management.  The patient states that she has no other significant GI issues at this time.  She has developed significant anxiousness about this polyp because she is otherwise very healthy and wanting to continue doing the activities of daily living and she has many things scheduled.  The patient has never had an upper endoscopy.  Patient does not take significant nonsteroidals or BC/Goody powders.  GI Review of Systems Positive as above Negative for pyrosis, dysphagia, odynophagia, pain, nausea, vomiting, change in bowel habits, melena, hematochezia  Review of Systems General: Denies fevers/chills/weight loss unintentionally Cardiovascular: Denies chest pain Pulmonary: Denies shortness of breath Gastroenterological: See HPI Genitourinary: Denies darkened urine Hematological:  Denies easy bruising/bleeding Dermatological: Denies jaundice Psychological: Mood is stable   Medications Current Outpatient Medications  Medication Sig Dispense Refill  . ALPRAZolam (XANAX) 0.25 MG tablet TAKE 1 TABLET BY MOUTH 3 TIMES A DAY AS NEEDED FOR ANXIETY *OFFICE VISIT NEEDED BEFOREA ANY REFILLS* 90 tablet 1  . Esterified Estrogens (MENEST) 0.3 MG tablet TAKE 1 TABLET (0.3 MG TOTAL) BY MOUTH DAILY. 90 tablet 4  . ESTRACE VAGINAL 0.1 MG/GM vaginal cream Place 1.06 Applicatorfuls vaginally once a week. 42.5 g 2  . metoprolol succinate (TOPROL-XL) 25 MG 24 hr tablet Take 1 tablet (25 mg total) by mouth in the morning and at bedtime. 180 tablet 3  . Multiple Vitamin (MULTIVITAMIN) capsule Take 1 capsule by mouth daily.    . potassium chloride (KLOR-CON) 8 MEQ tablet Take 1 tablet (8 mEq total) by mouth daily. 90 tablet 2  . Potassium Gluconate 550 MG TABS Take 1 tablet by mouth daily.    . Sodium Sulfate-Mag Sulfate-KCl (SUTAB) [phone] MG TABS Take 24 tablets by mouth as directed. 24 tablet 0  . triamterene-hydrochlorothiazide (MAXZIDE-25) 37.5-25 MG tablet Take 1 tablet by mouth daily. 90 tablet 1  . XIIDRA 5 % SOLN Place 1 drop into both eyes 2 (two) times daily.  2  . zolpidem (AMBIEN) 5 MG tablet TAKE 1 TABLET BY MOUTH AT BEDTIME AS NEEDED FOR SLEEP. Do not take w/alprazolam 90 tablet 1   No current facility-administered medications for this visit.    Allergies Allergies  Allergen Reactions  . Clindamycin Anaphylaxis  . Aspirin     bruising  . Fish Oil     bruising  . Latex     REACTION: sores in mouth  . Penicillins Hives    Has patient had a PCN  reaction causing immediate rash, facial/tongue/throat swelling, SOB or lightheadedness with hypotension: Yes Has patient had a PCN reaction causing severe rash involving mucus membranes or skin necrosis: No Has patient had a PCN reaction that required hospitalization No Has patient had a PCN reaction occurring within the  last 10 years: No If all of the above answers are "NO", then may proceed with Cephalosporin use.     Histories Past Medical History:  Diagnosis Date  . Cataract   . Diverticulitis   . FRACTURE, ARM, LEFT ~ 1960  . FRACTURE, FOOT ~ 1960  . FRACTURE, INDEX FINGER, LEFT ~ 1960  . Heart murmur   . HYPERTENSION 11/15/2007  . INSOMNIA, CHRONIC 06/17/2009  . MITRAL VALVE PROLAPSE, HX OF   . OSTEOARTHRITIS 11/15/2007  . Subdural hematoma ([place]) 03/2015   S/P fall   Past Surgical History:  Procedure Laterality Date  . CATARACT EXTRACTION W/ INTRAOCULAR LENS IMPLANT Left 01/2015  . COLON RESECTION N/A 07/28/2016   Procedure: LAPAROSCOPIC ASSISTED  COLOSTOMY TAKEDOWN WITH COLORECTAL ANASTOMOSIS;  Surgeon: Mickeal Skinner, MD;  Location: [facility];  Service: General;  Laterality: N/A;  . COLON SURGERY    . COLONOSCOPY    . COLOSTOMY TAKEDOWN  07/28/2016   laparoscopic converted to open take down of end colostomy with colo-rectostomy/notes 07/28/2016  . PARTIAL COLECTOMY N/A 02/11/2016   Procedure: PARTIAL COLECTOMY with colostomy;  Surgeon: Arta Bruce Kinsinger, MD;  Location: [facility];  Service: General;  Laterality: N/A;  . TONSILLECTOMY     Social History   Socioeconomic History  . Marital status: Divorced    Spouse name: Not on file  . Number of children: 2  . Years of education: Not on file  . Highest education level: Not on file  Occupational History  . Occupation: Home maker  Tobacco Use  . Smoking status: Never Smoker  . Smokeless tobacco: Never Used  Vaping Use  . Vaping Use: Never used  Substance and Sexual Activity  . Alcohol use: Yes    Alcohol/week: 1.0 standard drink    Types: 1 Glasses of wine per week    Comment: RARE  . Drug use: No  . Sexual activity: Not Currently    Comment: 1st intercourse- 20,  Other Topics Concern  . Not on file  Social History Narrative   [place], [place]   Married '72-17 yrs then divorced; remarried '90 - 4 yrs divorced. 1 boy  - '75; 1 daughter '83. Single partner since 2001. Socially active, riding horses not hunting but jumping little jumps. Lost her mother Spring '12 - doing OK with loss.   Social Determinants of Health   Financial Resource Strain: Low Risk   . Difficulty of Paying Living Expenses: Not hard at all  Food Insecurity: No Food Insecurity  . Worried About Charity fundraiser in the Last Year: Never true  . Ran Out of Food in the Last Year: Never true  Transportation Needs: No Transportation Needs  . Lack of Transportation (Medical): No  . Lack of Transportation (Non-Medical): No  Physical Activity: Sufficiently Active  . Days of Exercise per Week: 7 days  . Minutes of Exercise per Session: 30 min  Stress: No Stress Concern Present  . Feeling of Stress : Not at all  Social Connections: Moderately Integrated  . Frequency of Communication with Friends and Family: More than three times a week  . Frequency of Social Gatherings with Friends and Family: More than three times a week  .  Attends Religious Services: More than 4 times per year  . Active Member of Clubs or Organizations: Yes  . Attends Archivist Meetings: More than 4 times per year  . Marital Status: Divorced  Human resources officer Violence: Not At Risk  . Fear of Current or Ex-Partner: No  . Emotionally Abused: No  . Physically Abused: No  . Sexually Abused: No   Family History  Problem Relation Age of Onset  . Breast cancer Mother   . Hypertension Father   . Cancer Father        LUNG  . Parkinsonism Other   . Colon cancer Neg Hx   . Esophageal cancer Neg Hx   . Colon polyps Neg Hx   . Stomach cancer Neg Hx   . Inflammatory bowel disease Neg Hx   . Liver disease Neg Hx   . Pancreatic cancer Neg Hx   . Rectal cancer Neg Hx    I have reviewed her medical, social, and family history in detail and updated the electronic medical record as necessary.    PHYSICAL EXAMINATION  BP 134/70 (BP Location: Left Arm, Patient  Position: Sitting, Cuff Size: Normal)   Pulse 64   Ht 5\' 4"  (1.626 m) Comment: height measured without shoes  Wt 136 lb 4 oz (61.8 kg)   BMI 23.39 kg/m  Wt Readings from Last 3 Encounters:  12/19/20 136 lb 4 oz (61.8 kg)  12/10/20 135 lb (61.2 kg)  11/18/20 132 lb (59.9 kg)  GEN: NAD, appears stated age, doesn't appear chronically ill PSYCH: Cooperative, without pressured speech EYE: Conjunctivae pink, sclerae anicteric ENT: Masked CV: Nontachycardic RESP: No audible wheezing GI: NABS, soft, NT/ND, without rebound or guarding, no HSM appreciated MSK/EXT: No lower extremity edema SKIN: No jaundice NEURO:  Alert & Oriented x 3, no focal deficits   REVIEW OF DATA  I reviewed the following data at the time of this encounter:  GI Procedures and Studies  February 2022 colonoscopy - Two 5 to 7 mm polyps in the rectum and in the ascending colon, removed with a cold snare.  Resected and retrieved. - One 18 mm polyp in the transverse colon. Biopsied. Tattooed. - Non-bleeding external and internal hemorrhoids. Pathology Diagnosis 1. Ascending Colon Polyp, rectal (2) - SESSILE SERRATED POLYP (2 OF 3 FRAGMENTS) - BENIGN COLONIC MUCOSA (1 OF 3 FRAGMENTS) - NO HIGH GRADE DYSPLASIA OR MALIGNANCY IDENTIFIED 2. Transverse Colon Polyp, colon (1) - MULTIPLE FRAGMENTS OF SESSILE SERRATED POLYP(S) - NO HIGH GRADE DYSPLASIA OR MALIGNANCY IDENTIFIED  Laboratory Studies  Reviewed those in epic  Imaging Studies  No relevant studies to review   ASSESSMENT  Ms. Ishikawa is a 70 y.o. female with a pmh significant for HTN, mitral valve prolapse, prior subdural hematoma, diverticulosis (history of prior diverticulitis and prior resection), colon polyps (SSPs).  The patient is seen today for evaluation and management of:  1. History of colon polyps   2. Adenomatous polyp of ascending colon    The patient is clinically and hemodynamically stable.  Based upon the description and endoscopic  pictures I do feel that it is reasonable to pursue an Advanced Polypectomy attempt of the polyp/lesion.  We discussed some of the techniques of advanced polypectomy which include Endoscopic Mucosal Resection, OVESCO Full-Thickness Resection, Endorotor Morcellation, and Tissue Ablation via Fulguration.  We also reviewed images of typical techniques as noted above.  The risks and benefits of endoscopic evaluation were discussed with the patient; these include but are not limited  to the risk of perforation, infection, bleeding, missed lesions, lack of diagnosis, severe illness requiring hospitalization, as well as anesthesia and sedation related illnesses.  During attempts at advanced resection, the risks of bleeding and perforation/leak are increased as opposed to diagnostic and screening procedures, and that was discussed with the patient as well.   In addition, I explained that with the possible need for piecemeal resection, subsequent short-interval endoscopic evaluation for follow up and potential retreatment of the lesion/area may be necessary.  I did offer, a referral to surgery in order for patient to have opportunity to discuss surgical management/intervention prior to finalizing decision for attempt at endoscopic removal, however, the patient deferred on this.  If, after attempt at removal of the polyp/lesion, it is found that the patient has a complication or that an invasive lesion or malignant lesion is found, or that the polyp/lesion continues to recur, the patient is aware and understands that surgery may still be indicated/required.  Patient had issues with liquid based preparation as well as pill based preparation most recently so we will make some adjustments in her preparation.  All patient questions were answered, to the best of my ability, and the patient agrees to the aforementioned plan of action with follow-up as indicated.   PLAN  Preprocedure labs to be obtained Proceed with rescheduling  colonoscopy with EMR We will adjust the patient's preparation due to her issues with liquids and pill based colonoscopy prep -Continue daily senna as you are doing -Start Dulcolax 10 mg daily 3 days prior to colonoscopy -Transition from GoLYTELY to Collie Siad tab --Try to take full 12 pills of Sutab the night before and then the full 12 pills of Sutab the next day (if she cannot she will take at least 6 pills at each dosing scheduled) Follow-up to be dictated by results   Orders Placed This Encounter  Procedures  . CBC  . Basic Metabolic Panel (BMET)  . INR/PT  . Ambulatory referral to Gastroenterology    New Prescriptions   SODIUM SULFATE-MAG SULFATE-KCL (SUTAB) [phone] MG TABS    Take 24 tablets by mouth as directed.   Modified Medications   No medications on file    Planned Follow Up No follow-ups on file.   Total Time in Face-to-Face and in Coordination of Care for patient including independent/personal interpretation/review of prior testing, medical history, examination, medication adjustment, communicating results with the patient directly, and documentation with the EHR is 30 minutes.  Justice Britain, MD [facility] Advanced [facility]oscopy Office # [phone]

## 2020-12-22 ENCOUNTER — Encounter: Payer: Self-pay | Admitting: Gastroenterology

## 2020-12-22 DIAGNOSIS — D122 Benign neoplasm of ascending colon: Secondary | ICD-10-CM | POA: Insufficient documentation

## 2020-12-22 DIAGNOSIS — Z86010 Personal history of colonic polyps: Secondary | ICD-10-CM | POA: Insufficient documentation

## 2021-01-01 ENCOUNTER — Other Ambulatory Visit (HOSPITAL_COMMUNITY): Payer: Medicare Other

## 2021-01-05 DIAGNOSIS — Z1159 Encounter for screening for other viral diseases: Secondary | ICD-10-CM | POA: Diagnosis not present

## 2021-01-16 ENCOUNTER — Encounter: Payer: Self-pay | Admitting: Gastroenterology

## 2021-01-16 ENCOUNTER — Ambulatory Visit (INDEPENDENT_AMBULATORY_CARE_PROVIDER_SITE_OTHER): Payer: Medicare Other | Admitting: Gastroenterology

## 2021-01-16 VITALS — BP 138/70 | HR 58 | Ht 64.0 in | Wt 133.4 lb

## 2021-01-16 DIAGNOSIS — K641 Second degree hemorrhoids: Secondary | ICD-10-CM

## 2021-01-16 NOTE — Progress Notes (Signed)
PROCEDURE NOTE: The patient presents with symptomatic grade II  hemorrhoids, requesting rubber band ligation of his/her hemorrhoidal disease.  All risks, benefits and alternative forms of therapy were described and informed consent was obtained.   The anorectum was pre-medicated with 0.125% NTG and Recticare The decision was made to band the Right anterior internal hemorrhoid, and the [place] was used to perform band ligation without complication.  Digital anorectal examination was then performed to assure proper positioning of the band, and to adjust the banded tissue as required.  The patient was discharged home without pain or other issues.  Dietary and behavioral recommendations were given and along with follow-up instructions.       The patient will return in 2-4 weeks for  follow-up and possible additional banding as required. No complications were encountered and the patient tolerated the procedure well.  Damaris Hippo , MD [phone]

## 2021-01-16 NOTE — Patient Instructions (Signed)

## 2021-01-19 ENCOUNTER — Ambulatory Visit: Payer: Medicare Other | Admitting: Internal Medicine

## 2021-01-23 ENCOUNTER — Other Ambulatory Visit: Payer: Self-pay | Admitting: Obstetrics & Gynecology

## 2021-01-23 ENCOUNTER — Other Ambulatory Visit: Payer: Self-pay

## 2021-01-23 ENCOUNTER — Ambulatory Visit (INDEPENDENT_AMBULATORY_CARE_PROVIDER_SITE_OTHER): Payer: Medicare Other | Admitting: Obstetrics & Gynecology

## 2021-01-23 ENCOUNTER — Encounter: Payer: Self-pay | Admitting: Obstetrics & Gynecology

## 2021-01-23 VITALS — BP 140/86 | Ht 63.75 in | Wt 132.6 lb

## 2021-01-23 DIAGNOSIS — Z01419 Encounter for gynecological examination (general) (routine) without abnormal findings: Secondary | ICD-10-CM

## 2021-01-23 DIAGNOSIS — Z79890 Hormone replacement therapy (postmenopausal): Secondary | ICD-10-CM

## 2021-01-23 MED ORDER — ESTERIFIED ESTROGENS 0.3 MG PO TABS: 0.1500 mg | ORAL_TABLET | Freq: Every day | ORAL | 4 refills | Status: DC

## 2021-01-23 MED ORDER — ESTRACE 0.1 MG/GM VA CREA: 0.2500 | TOPICAL_CREAM | VAGINAL | 2 refills | Status: DC

## 2021-01-23 NOTE — Telephone Encounter (Signed)
AEX is scheduled today 01/23/21 at 2pm. Will hold until after visit.

## 2021-01-23 NOTE — Progress Notes (Signed)
Sue [name] 1950-10-23 [id number]   History:    70 y.o. G1P1L2 Long standing boyfriend.  FX:[id number] presenting for annual gyn exam   TMH:[id number], well on Menest 0.3 1/2 tab q2 days, refuses to d/c.Declined combining with Progesterone in the past.Estrace cream once a week. No PMB. No pelvic pain. Abstinent. H/O LEEP 2008. Paps normal since then, last pap 12/2019 Negative. Urine/BMs wnl. Had a bowel resection because of perforation associated with diverticulitis in 2017. Breasts wnl.  Mammo 08/2020 Negative. BMI 22.94. Good fitness and healthy nutrition.  ColoGard pos, Colono 10/2020 with 2 Polyps removed.  3rd Polyp Bx benign, will be removed next week.  Health labs with Fam MD.  Past medical history,surgical history, family history and social history were all reviewed and documented in the EPIC chart.  Gynecologic History No LMP recorded. Patient is postmenopausal.  Obstetric History OB History  Gravida Para Term Preterm AB Living  1 1       1   SAB IAB Ectopic Multiple Live Births               # Outcome Date GA Lbr Len/2nd Weight Sex Delivery Anes PTL Lv  1 Para              ROS: A ROS was performed and pertinent positives and negatives are included in the history.  GENERAL: No fevers or chills. HEENT: No change in vision, no earache, sore throat or sinus congestion. NECK: No pain or stiffness. CARDIOVASCULAR: No chest pain or pressure. No palpitations. PULMONARY: No shortness of breath, cough or wheeze. GASTROINTESTINAL: No abdominal pain, nausea, vomiting or diarrhea, melena or bright red blood per rectum. GENITOURINARY: No urinary frequency, urgency, hesitancy or dysuria. MUSCULOSKELETAL: No joint or muscle pain, no back pain, no recent trauma. DERMATOLOGIC: No rash, no itching, no lesions. ENDOCRINE: No polyuria, polydipsia, no heat or cold intolerance. No recent change in weight. HEMATOLOGICAL: No anemia or easy bruising or bleeding.  NEUROLOGIC: No headache, seizures, numbness, tingling or weakness. PSYCHIATRIC: No depression, no loss of interest in normal activity or change in sleep pattern.     Exam:   BP 140/86   Ht 5' 3.75" (1.619 m)   Wt 132 lb 9.6 oz (60.1 kg)   BMI 22.94 kg/m   Body mass index is 22.94 kg/m.  General appearance : Well developed well nourished female. No acute distress HEENT: Eyes: no retinal hemorrhage or exudates,  Neck supple, trachea midline, no carotid bruits, no thyroidmegaly Lungs: Clear to auscultation, no rhonchi or wheezes, or rib retractions  Heart: Regular rate and rhythm, no murmurs or gallops Breast:Examined in sitting and supine position were symmetrical in appearance, no palpable masses or tenderness,  no skin retraction, no nipple inversion, no nipple discharge, no skin discoloration, no axillary or supraclavicular lymphadenopathy Abdomen: no palpable masses or tenderness, no rebound or guarding Extremities: no edema or skin discoloration or tenderness  Pelvic: Vulva: Normal             Vagina: No gross lesions or discharge  Cervix: No gross lesions or discharge  Uterus  AV, normal size, shape and consistency, non-tender and mobile  Adnexa  Without masses or tenderness  Anus: Normal   Assessment/Plan:  70 y.o. female for annual exam   1. Well female exam with routine gynecological exam Normal gynecologic exam in menopause.  Pap test in April 2021 was negative, no indication to repeat at this point.  Breast exam normal.  Screening mammogram December 2021 was  negative.  Colonoscopy February 2022 with benign polyps.  Health labs with family physician.  Body mass index 22.94.  Physically active and healthy nutrition.  2. Postmenopausal hormone replacement therapy Well on Estratest 0.3 mg/tab. half a tablet every 2 days.  Patient understands the risks including the risk of strokes and breast cancer associated with estrogen replacement therapy at this time in her life and  without progesterone which increases the risk of endometrial cancer.  Refuses to stop Estratest.  Using Estrace vaginal cream occasionally in small amounts.  Recent bone density of her thumb/wrist was completely normal.  Other orders - ESTRACE VAGINAL 0.1 MG/GM vaginal cream; Place 4.62 Applicatorfuls vaginally once a week. - Esterified Estrogens 0.3 MG tablet; Take 0.5 tablets (0.15 mg total) by mouth daily.  Princess Bruins MD, 2:42 PM 01/23/2021

## 2021-01-26 ENCOUNTER — Other Ambulatory Visit (HOSPITAL_COMMUNITY)
Admission: RE | Admit: 2021-01-26 | Discharge: 2021-01-26 | Disposition: A | Discharge status: Home / Self Care | Payer: Medicare Other | Source: Ambulatory Visit | Attending: Gastroenterology | Admitting: Gastroenterology

## 2021-01-26 DIAGNOSIS — Z20822 Contact with and (suspected) exposure to covid-19: Secondary | ICD-10-CM | POA: Diagnosis not present

## 2021-01-26 DIAGNOSIS — Z01812 Encounter for preprocedural laboratory examination: Secondary | ICD-10-CM | POA: Insufficient documentation

## 2021-01-26 DIAGNOSIS — M545 Low back pain, unspecified: Secondary | ICD-10-CM | POA: Diagnosis not present

## 2021-01-27 LAB — SARS CORONAVIRUS 2 (TAT 6-24 HRS): SARS Coronavirus 2: NEGATIVE

## 2021-01-28 ENCOUNTER — Other Ambulatory Visit: Payer: Self-pay

## 2021-01-28 ENCOUNTER — Encounter (HOSPITAL_COMMUNITY): Payer: Self-pay | Admitting: Gastroenterology

## 2021-01-28 DIAGNOSIS — L821 Other seborrheic keratosis: Secondary | ICD-10-CM | POA: Diagnosis not present

## 2021-01-28 DIAGNOSIS — L820 Inflamed seborrheic keratosis: Secondary | ICD-10-CM | POA: Diagnosis not present

## 2021-01-28 DIAGNOSIS — Z85828 Personal history of other malignant neoplasm of skin: Secondary | ICD-10-CM | POA: Diagnosis not present

## 2021-01-28 DIAGNOSIS — D1801 Hemangioma of skin and subcutaneous tissue: Secondary | ICD-10-CM | POA: Diagnosis not present

## 2021-01-28 DIAGNOSIS — L814 Other melanin hyperpigmentation: Secondary | ICD-10-CM | POA: Diagnosis not present

## 2021-01-28 DIAGNOSIS — D692 Other nonthrombocytopenic purpura: Secondary | ICD-10-CM | POA: Diagnosis not present

## 2021-01-28 NOTE — Progress Notes (Signed)
Patient denies shortness of breath, fever, cough or chest pain.  PCP - Dr Alain Marion Cardiologist - n/a  Chest x-ray - n/a EKG - DOS 01/29/21 Stress Test -  ECHO - 09/16/06 Cardiac Cath - n/a  Anesthesia review: Yes  STOP now taking any Aspirin (unless otherwise instructed by your surgeon), Aleve, Naproxen, Ibuprofen, Motrin, Advil, Goody's, BC's, all herbal medications, fish oil, and all vitamins.   Coronavirus Screening Covid test on 01/26/21 was negative.  Patient verbalized understanding of instructions that were given via phone.

## 2021-01-29 ENCOUNTER — Other Ambulatory Visit (HOSPITAL_COMMUNITY): Payer: Medicare Other

## 2021-01-29 ENCOUNTER — Ambulatory Visit (HOSPITAL_COMMUNITY): Payer: Medicare Other | Admitting: Physician Assistant

## 2021-01-29 ENCOUNTER — Other Ambulatory Visit: Payer: Self-pay

## 2021-01-29 ENCOUNTER — Encounter (HOSPITAL_COMMUNITY)
Admission: RE | Disposition: A | Discharge status: Home / Self Care | Payer: Self-pay | Source: Home / Self Care | Attending: Gastroenterology

## 2021-01-29 ENCOUNTER — Encounter (HOSPITAL_COMMUNITY): Payer: Self-pay | Admitting: Gastroenterology

## 2021-01-29 ENCOUNTER — Ambulatory Visit (HOSPITAL_COMMUNITY)
Admission: RE | Admit: 2021-01-29 | Discharge: 2021-01-29 | Disposition: A | Discharge status: Home / Self Care | Payer: Medicare Other | Attending: Gastroenterology | Admitting: Gastroenterology

## 2021-01-29 DIAGNOSIS — K644 Residual hemorrhoidal skin tags: Secondary | ICD-10-CM | POA: Insufficient documentation

## 2021-01-29 DIAGNOSIS — Z980 Intestinal bypass and anastomosis status: Secondary | ICD-10-CM | POA: Diagnosis not present

## 2021-01-29 DIAGNOSIS — Z91040 Latex allergy status: Secondary | ICD-10-CM | POA: Insufficient documentation

## 2021-01-29 DIAGNOSIS — Z886 Allergy status to analgesic agent status: Secondary | ICD-10-CM | POA: Diagnosis not present

## 2021-01-29 DIAGNOSIS — Z881 Allergy status to other antibiotic agents status: Secondary | ICD-10-CM | POA: Insufficient documentation

## 2021-01-29 DIAGNOSIS — D120 Benign neoplasm of cecum: Secondary | ICD-10-CM | POA: Diagnosis not present

## 2021-01-29 DIAGNOSIS — K626 Ulcer of anus and rectum: Secondary | ICD-10-CM | POA: Diagnosis not present

## 2021-01-29 DIAGNOSIS — Z880 Allergy status to penicillin: Secondary | ICD-10-CM | POA: Insufficient documentation

## 2021-01-29 DIAGNOSIS — D123 Benign neoplasm of transverse colon: Secondary | ICD-10-CM | POA: Diagnosis not present

## 2021-01-29 DIAGNOSIS — K641 Second degree hemorrhoids: Secondary | ICD-10-CM | POA: Insufficient documentation

## 2021-01-29 DIAGNOSIS — D124 Benign neoplasm of descending colon: Secondary | ICD-10-CM | POA: Diagnosis not present

## 2021-01-29 DIAGNOSIS — K6289 Other specified diseases of anus and rectum: Secondary | ICD-10-CM | POA: Diagnosis not present

## 2021-01-29 DIAGNOSIS — K635 Polyp of colon: Secondary | ICD-10-CM | POA: Diagnosis not present

## 2021-01-29 HISTORY — PX: POLYPECTOMY: SHX5525

## 2021-01-29 HISTORY — PX: HEMOSTASIS CLIP PLACEMENT: SHX6857

## 2021-01-29 HISTORY — DX: Anxiety disorder, unspecified: F41.9

## 2021-01-29 HISTORY — PX: SUBMUCOSAL LIFTING INJECTION: SHX6855

## 2021-01-29 HISTORY — PX: ENDOSCOPIC MUCOSAL RESECTION: SHX6839

## 2021-01-29 HISTORY — PX: COLONOSCOPY WITH PROPOFOL: SHX5780

## 2021-01-29 SURGERY — COLONOSCOPY WITH PROPOFOL
Anesthesia: Monitor Anesthesia Care

## 2021-01-29 MED ORDER — SODIUM CHLORIDE 0.9 % IV SOLN: INTRAVENOUS | Status: DC

## 2021-01-29 MED ORDER — PROPOFOL 10 MG/ML IV BOLUS
INTRAVENOUS | Status: DC | PRN
  Administered 2021-01-29: 20 mg via INTRAVENOUS
  Administered 2021-01-29: 40 mg via INTRAVENOUS

## 2021-01-29 MED ORDER — PROPOFOL 500 MG/50ML IV EMUL
INTRAVENOUS | Status: DC | PRN
  Administered 2021-01-29: 125 ug/kg/min via INTRAVENOUS

## 2021-01-29 MED ORDER — LACTATED RINGERS IV SOLN: Freq: Once | INTRAVENOUS | Status: AC

## 2021-01-29 MED ORDER — LACTATED RINGERS IV SOLN: INTRAVENOUS | Status: DC | PRN

## 2021-01-29 SURGICAL SUPPLY — 21 items

## 2021-01-29 NOTE — Anesthesia Postprocedure Evaluation (Signed)
Anesthesia Post Note  Patient: NYELLA ECKELS  Procedure(s) Performed: COLONOSCOPY WITH PROPOFOL (N/A ) ENDOSCOPIC MUCOSAL RESECTION (N/A ) POLYPECTOMY HEMOSTASIS CLIP PLACEMENT     Patient location during evaluation: Endoscopy Anesthesia Type: MAC Level of consciousness: awake Pain management: pain level controlled Vital Signs Assessment: post-procedure vital signs reviewed and stable Respiratory status: spontaneous breathing Cardiovascular status: stable Postop Assessment: no apparent nausea or vomiting Anesthetic complications: no   No complications documented.  Last Vitals:  Vitals:   01/29/21 1155 01/29/21 1205  BP: (!) 190/88 (!) 184/74  Pulse: (!) 54 (!) 54  Resp: (!) 21 19  Temp:    SpO2: 100% 100%    Last Pain:  Vitals:   01/29/21 1205  TempSrc:   PainSc: 0-No pain                 Allyah Heather

## 2021-01-29 NOTE — Op Note (Signed)
[facility] Patient Name: Sue [name] Procedure Date : 01/29/2021 MRN: [id number] Attending MD: Justice Britain , MD Date of Birth: [birth year] CSN: [id number] Age: 70 Admit Type: Outpatient Procedure:                Colonoscopy Indications:              Excision of colonic polyp Providers:                Justice Britain, MD, Baird Cancer, RN, Cletis Athens, Technician Referring MD:             Mauri Pole, MD, Evie Lacks Plotnikov MD, MD Medicines:                Monitored Anesthesia Care Complications:            No immediate complications. Estimated Blood Loss:     Estimated blood loss was minimal. Procedure:                Pre-Anesthesia Assessment:                           - Prior to the procedure, a History and Physical                            was performed, and patient medications and                            allergies were reviewed. The patient's tolerance of                            previous anesthesia was also reviewed. The risks                            and benefits of the procedure and the sedation                            options and risks were discussed with the patient.                            All questions were answered, and informed consent                            was obtained. Prior Anticoagulants: The patient has                            taken no previous anticoagulant or antiplatelet                            agents. ASA Grade Assessment: III - A patient with                            severe systemic disease. After reviewing the risks  and benefits, the patient was deemed in                            satisfactory condition to undergo the procedure.                           After obtaining informed consent, the colonoscope                            was passed under direct vision. Throughout the                            procedure, the patient's blood  pressure, pulse, and                            oxygen saturations were monitored continuously. The                            PCF-H190DL ([id number]) Olympus pediatric colonoscope                            was introduced through the anus and advanced to the                            the cecum, identified by appendiceal orifice and                            ileocecal valve. The colonoscopy was performed                            without difficulty. The patient tolerated the                            procedure. The quality of the bowel preparation was                            fair. The ileocecal valve, appendiceal orifice, and                            rectum were photographed. Scope In: 10:34:57 AM Scope Out: 11:25:52 AM Scope Withdrawal Time: 0 hours 46 minutes 46 seconds  Total Procedure Duration: 0 hours 50 minutes 55 seconds  Findings:      Skin tags were found on perianal exam.      The digital rectal exam findings include palpable rectal ulcerat and       hemorrhoids.      Extensive amounts of semi-liquid stool was found in the entire colon,       making visualization difficult. Lavage of the area was performed using       copious amounts, resulting in clearance with fair visualization.      Three sessile polyps were found in the descending colon, transverse       colon and cecum. The polyps were 3 to 8 mm in size. These polyps were       removed with a cold snare. Resection and retrieval  were complete.      A 30 mm polyp was found in the transverse colon with tattoo underneath       it. The polyp was granular and lateral spreading, there was a bit of       central depression noted initially. Preparations were made for mucosal       resection. NBI imaging and White-light endoscopy was done to demarcate       the borders of the lesion. Orise gel was injected to raise the lesion       which was felt to be successful. Piecemeal mucosal resection using a       snare was  performed. Resection and retrieval were complete. Coagulation       for tissue destruction of the margin using STSC was successful. To       prevent bleeding after mucosal resection, five hemostatic clips were       successfully placed (MR conditional). There was no bleeding at the end       of the procedure.      There was evidence of a prior end-to-side colo-colonic anastomosis in       the proximal rectum (@10  cm). This was patent and was characterized by       healthy appearing mucosa. The anastomosis was traversed.      A single (solitary) eight mm ulcer was found in the distal rectum. No       bleeding was present. No stigmata of recent bleeding were seen. This is       from recent hemmorhoidal banding.      Non-bleeding non-thrombosed external and internal hemorrhoids were found       during retroflexion, during perianal exam and during digital exam. The       hemorrhoids were Grade II (internal hemorrhoids that prolapse but reduce       spontaneously). Impression:               - Preparation of the colon was fair even after                            copious lavage.                           - Palpable rectal ulceration and hemorrhoids found                            on digital rectal exam.                           - Three 3 to 8 mm polyps in the descending colon,                            in the transverse colon and in the cecum, removed                            with a cold snare. Resected and retrieved.                           - One 30 mm polyp in the transverse colon, removed  with piecemeal mucosal resection. Resected and                            retrieved. Treated with STSC. Clips (MR                            conditional) were placed.                           - Patent end-to-side colo-colonic anastomosis,                            characterized by healthy appearing mucosa.                           - A single (solitary) ulcer in the  distal rectum -                            recent hemorrhoidal banding.                           - Non-bleeding non-thrombosed internal hemorrhoids.                           . Recommendation:           - The patient will be observed post-procedure,                            until all discharge criteria are met.                           - Discharge patient to home.                           - Patient has a contact number available for                            emergencies. The signs and symptoms of potential                            delayed complications were discussed with the                            patient. Return to normal activities tomorrow.                            Written discharge instructions were provided to the                            patient.                           - High fiber diet.                           - No aspirin, ibuprofen, naproxen, or other  non-steroidal anti-inflammatory drugs for 2 weeks                            after polyp removal.                           - Continue present medications.                           - Await pathology results.                           - Repeat colonoscopy in 9-12 months for                            surveillance after piecemeal polypectomy.                           - The findings and recommendations were discussed                            with the patient.                           - The findings and recommendations were discussed                            with the patient's family. Procedure Code(s):        --- Professional ---                           [phone], Colonoscopy, flexible; with endoscopic                            mucosal resection                           [id number], 16, Colonoscopy, flexible; with removal of                            tumor(s), polyp(s), or other lesion(s) by snare                            technique Diagnosis Code(s):        --- Professional ---                            K64.1, Second degree hemorrhoids                           K62.89, Other specified diseases of anus and rectum                           K63.5, Polyp of colon                           Z98.0, Intestinal bypass and anastomosis status  K62.6, Ulcer of anus and rectum CPT copyright 2019 American Medical Association. All rights reserved. The codes documented in this report are preliminary and upon coder review may  be revised to meet current compliance requirements. Justice Britain, MD 01/29/2021 12:04:11 PM Number of Addenda: 0

## 2021-01-29 NOTE — H&P (Signed)
GASTROENTEROLOGY PROCEDURE H&P NOTE   Primary Care Physician: Cassandria Anger, MD  HPI: Sue SANGIOVANNI is a 70 y.o. female who presents for Colonoscopy with attempt at resection of SSP with central depression.  Past Medical History:  Diagnosis Date  . Anxiety   . Cataract   . Diverticulitis   . FRACTURE, ARM, LEFT ~ 1960  . FRACTURE, FOOT ~ 1960  . FRACTURE, INDEX FINGER, LEFT ~ 1960  . Heart murmur    patient denies this dx on 01/28/21  . HYPERTENSION 11/15/2007  . INSOMNIA, CHRONIC 06/17/2009  . MITRAL VALVE PROLAPSE, HX OF   . OSTEOARTHRITIS 11/15/2007  . Subdural hematoma ([place]) 03/2015   S/P fall   Past Surgical History:  Procedure Laterality Date  . CATARACT EXTRACTION W/ INTRAOCULAR LENS IMPLANT Left 01/2015  . COLON RESECTION N/A 07/28/2016   Procedure: LAPAROSCOPIC ASSISTED  COLOSTOMY TAKEDOWN WITH COLORECTAL ANASTOMOSIS;  Surgeon: Mickeal Skinner, MD;  Location: [place];  Service: General;  Laterality: N/A;  . COLON SURGERY    . COLONOSCOPY    . COLOSTOMY TAKEDOWN  07/28/2016   laparoscopic converted to open take down of end colostomy with colo-rectostomy/notes 07/28/2016  . EYE SURGERY    . PARTIAL COLECTOMY N/A 02/11/2016   Procedure: PARTIAL COLECTOMY with colostomy;  Surgeon: Arta Bruce Kinsinger, MD;  Location: [place];  Service: General;  Laterality: N/A;  . TONSILLECTOMY     Current Facility-Administered Medications  Medication Dose Route Frequency Provider Last Rate Last Admin  . 0.9 %  sodium chloride infusion   Intravenous Continuous Mansouraty, Telford Nab., MD       Allergies  Allergen Reactions  . Clindamycin Anaphylaxis  . Aspirin     bruising  . Fish Oil     bruising  . Latex     REACTION: sores in mouth  . Penicillins Hives    Has patient had a PCN reaction causing immediate rash, facial/tongue/throat swelling, SOB or lightheadedness with hypotension: Yes Has patient had a PCN reaction causing severe rash involving mucus membranes  or skin necrosis: No Has patient had a PCN reaction that required hospitalization No Has patient had a PCN reaction occurring within the last 10 years: No If all of the above answers are "NO", then may proceed with Cephalosporin use.    Family History  Problem Relation Age of Onset  . Breast cancer Mother   . Hypertension Father   . Cancer Father        LUNG  . Parkinsonism Other   . Colon cancer Neg Hx   . Esophageal cancer Neg Hx   . Colon polyps Neg Hx   . Stomach cancer Neg Hx   . Inflammatory bowel disease Neg Hx   . Liver disease Neg Hx   . Pancreatic cancer Neg Hx   . Rectal cancer Neg Hx    Social History   Socioeconomic History  . Marital status: Divorced    Spouse name: Not on file  . Number of children: 2  . Years of education: Not on file  . Highest education level: Not on file  Occupational History  . Occupation: Home maker  Tobacco Use  . Smoking status: Never Smoker  . Smokeless tobacco: Never Used  Vaping Use  . Vaping Use: Never used  Substance and Sexual Activity  . Alcohol use: Yes    Alcohol/week: 1.0 standard drink    Types: 1 Glasses of wine per week    Comment: occasional wine  .  Drug use: No  . Sexual activity: Not Currently    Birth control/protection: Post-menopausal  Other Topics Concern  . Not on file  Social History Narrative   [place], [place]   Married '72-17 yrs then divorced; remarried '90 - 4 yrs divorced. 1 boy - '75; 1 daughter '83. Single partner since 2001. Socially active, riding horses not hunting but jumping little jumps. Lost her mother Spring '12 - doing OK with loss.   Social Determinants of Health   Financial Resource Strain: Low Risk   . Difficulty of Paying Living Expenses: Not hard at all  Food Insecurity: No Food Insecurity  . Worried About Charity fundraiser in the Last Year: Never true  . Ran Out of Food in the Last Year: Never true  Transportation Needs: No Transportation Needs  . Lack of  Transportation (Medical): No  . Lack of Transportation (Non-Medical): No  Physical Activity: Sufficiently Active  . Days of Exercise per Week: 7 days  . Minutes of Exercise per Session: 30 min  Stress: No Stress Concern Present  . Feeling of Stress : Not at all  Social Connections: Moderately Integrated  . Frequency of Communication with Friends and Family: More than three times a week  . Frequency of Social Gatherings with Friends and Family: More than three times a week  . Attends Religious Services: More than 4 times per year  . Active Member of Clubs or Organizations: Yes  . Attends Archivist Meetings: More than 4 times per year  . Marital Status: Divorced  Human resources officer Violence: Not At Risk  . Fear of Current or Ex-Partner: No  . Emotionally Abused: No  . Physically Abused: No  . Sexually Abused: No    Physical Exam: Vital signs in last 24 hours: Temp:  [97.8 F (36.6 C)] 97.8 F (36.6 C) (05/19 0925) Resp:  [20] 20 (05/19 0925) BP: (189)/(70) 189/70 (05/19 0925) SpO2:  [100 %] 100 % (05/19 0925)   GEN: NAD EYE: Sclerae anicteric ENT: MMM CV: Non-tachycardic GI: Soft, NT/ND NEURO:  Alert & Oriented x 3  Lab Results: No results for input(s): WBC, HGB, HCT, PLT in the last 72 hours. BMET No results for input(s): NA, K, CL, CO2, GLUCOSE, BUN, CREATININE, CALCIUM in the last 72 hours. LFT No results for input(s): PROT, ALBUMIN, AST, ALT, ALKPHOS, BILITOT, BILIDIR, IBILI in the last 72 hours. PT/INR No results for input(s): LABPROT, INR in the last 72 hours.   Impression / Plan: This is a 70 y.o.female who presents for Colonoscopy with attempt at resection of SSP with central depression.  The risks and benefits of endoscopic evaluation were discussed with the patient; these include but are not limited to the risk of perforation, infection, bleeding, missed lesions, lack of diagnosis, severe illness requiring hospitalization, as well as anesthesia and  sedation related illnesses.  The patient is agreeable to proceed.    Justice Britain, MD [facility] Office # [phone]

## 2021-01-29 NOTE — Transfer of Care (Signed)
Immediate Anesthesia Transfer of Care Note  Patient: Sue [name]  Procedure(s) Performed: COLONOSCOPY WITH PROPOFOL (N/A ) ENDOSCOPIC MUCOSAL RESECTION (N/A ) POLYPECTOMY HEMOSTASIS CLIP PLACEMENT  Patient Location: PACU  Anesthesia Type:MAC  Level of Consciousness: awake, alert  and oriented  Airway & Oxygen Therapy: Patient Spontanous Breathing and Patient connected to face mask oxygen  Post-op Assessment: Report given to RN and Post -op Vital signs reviewed and stable  Post vital signs: Reviewed and stable  Last Vitals:  Vitals Value Taken Time  BP 167/84   Temp    Pulse 60   Resp 12   SpO2 99%     Last Pain:  Vitals:   01/29/21 0925  TempSrc: Oral  PainSc: 0-No pain         Complications: No complications documented.

## 2021-01-29 NOTE — Anesthesia Preprocedure Evaluation (Signed)
Anesthesia Evaluation  Patient identified by MRN, date of birth, ID band Patient awake    Reviewed: Allergy & Precautions, NPO status , Patient's Chart, lab work & pertinent test results  Airway Mallampati: II  TM Distance: >3 FB     Dental   Pulmonary neg pulmonary ROS,    breath sounds clear to auscultation       Cardiovascular hypertension, + CAD  + Valvular Problems/Murmurs  Rhythm:Regular Rate:Normal     Neuro/Psych Anxiety negative neurological ROS     GI/Hepatic Neg liver ROS, History noted CG   Endo/Other  negative endocrine ROS  Renal/GU negative Renal ROS     Musculoskeletal  (+) Arthritis ,   Abdominal   Peds  Hematology   Anesthesia Other Findings   Reproductive/Obstetrics                             Anesthesia Physical Anesthesia Plan  ASA: III  Anesthesia Plan: MAC   Post-op Pain Management:    Induction:   PONV Risk Score and Plan: 2 and Propofol infusion  Airway Management Planned: Nasal Cannula and Simple Face Mask  Additional Equipment:   Intra-op Plan:   Post-operative Plan:   Informed Consent: I have reviewed the patients History and Physical, chart, labs and discussed the procedure including the risks, benefits and alternatives for the proposed anesthesia with the patient or authorized representative who has indicated his/her understanding and acceptance.     Dental advisory given  Plan Discussed with: CRNA and Anesthesiologist  Anesthesia Plan Comments:         Anesthesia Quick Evaluation

## 2021-01-29 NOTE — Discharge Instructions (Signed)
YOU HAD AN ENDOSCOPIC PROCEDURE TODAY: Refer to the procedure report and other information in the discharge instructions given to you for any specific questions about what was found during the examination. If this information does not answer your questions, please call [facility] office at [phone] to clarify.  ° °YOU SHOULD EXPECT: Some feelings of bloating in the abdomen. Passage of more gas than usual. Walking can help get rid of the air that was put into your GI tract during the procedure and reduce the bloating. If you had a lower endoscopy (such as a colonoscopy or flexible sigmoidoscopy) you may notice spotting of blood in your stool or on the toilet paper. Some abdominal soreness may be present for a day or two, also. ° °DIET: Your first meal following the procedure should be a light meal and then it is ok to progress to your normal diet. A half-sandwich or bowl of soup is an example of a good first meal. Heavy or fried foods are harder to digest and may make you feel nauseous or bloated. Drink plenty of fluids but you should avoid alcoholic beverages for 24 hours. If you had a esophageal dilation, please see attached instructions for diet.   ° °ACTIVITY: Your care partner should take you home directly after the procedure. You should plan to take it easy, moving slowly for the rest of the day. You can resume normal activity the day after the procedure however YOU SHOULD NOT DRIVE, use power tools, machinery or perform tasks that involve climbing or major physical exertion for 24 hours (because of the sedation medicines used during the test).  ° °SYMPTOMS TO REPORT IMMEDIATELY: °A gastroenterologist can be reached at any hour. Please call [phone]  for any of the following symptoms:  °Following lower endoscopy (colonoscopy, flexible sigmoidoscopy) °Excessive amounts of blood in the stool  °Significant tenderness, worsening of abdominal pains  °Swelling of the abdomen that is new, acute  °Fever of 100° or  higher  ° °Black, tarry-looking or red, bloody stools ° °FOLLOW UP:  °If any biopsies were taken you will be contacted by phone or by letter within the next 1-3 weeks. Call [phone]  if you have not heard about the biopsies in 3 weeks.  °Please also call with any specific questions about appointments or follow up tests.  °

## 2021-01-30 ENCOUNTER — Encounter (HOSPITAL_COMMUNITY): Payer: Self-pay | Admitting: Gastroenterology

## 2021-01-30 ENCOUNTER — Encounter: Payer: Self-pay | Admitting: Gastroenterology

## 2021-01-30 LAB — SURGICAL PATHOLOGY

## 2021-02-02 ENCOUNTER — Telehealth: Payer: Self-pay | Admitting: Gastroenterology

## 2021-02-02 NOTE — Telephone Encounter (Signed)
Pt had a second colonoscopy last week and is scheduled for banding on 02/12/21. She wants to know if it is still ok have banding done given the short period of time between colon and banding.

## 2021-02-02 NOTE — Telephone Encounter (Signed)
Left message on machine to call back  

## 2021-02-03 NOTE — Telephone Encounter (Signed)
The pt has been advised that she can keep her appt as planned for her banding. She does mention that she has a significant bruise where her IV was inserted.  She has no pain, or swelling. She was reassured that this should resolve over time and to use ice if she has any discomfort.  She will call her PCP if this does not resolve.

## 2021-02-04 DIAGNOSIS — S39012D Strain of muscle, fascia and tendon of lower back, subsequent encounter: Secondary | ICD-10-CM | POA: Diagnosis not present

## 2021-02-04 DIAGNOSIS — M545 Low back pain, unspecified: Secondary | ICD-10-CM | POA: Diagnosis not present

## 2021-02-12 ENCOUNTER — Encounter: Payer: Self-pay | Admitting: Gastroenterology

## 2021-02-12 ENCOUNTER — Ambulatory Visit (INDEPENDENT_AMBULATORY_CARE_PROVIDER_SITE_OTHER): Payer: Medicare Other | Admitting: Gastroenterology

## 2021-02-12 VITALS — BP 120/72 | HR 63 | Ht 63.75 in | Wt 133.5 lb

## 2021-02-12 DIAGNOSIS — K641 Second degree hemorrhoids: Secondary | ICD-10-CM

## 2021-02-12 NOTE — Progress Notes (Signed)
PROCEDURE NOTE: The patient presents with symptomatic grade II  hemorrhoids, requesting rubber band ligation of his/her hemorrhoidal disease.  All risks, benefits and alternative forms of therapy were described and informed consent was obtained.   The anorectum was pre-medicated with 0.125% NTG and Recticare The decision was made to band the Left lateral internal hemorrhoid, and the [place] was used to perform band ligation without complication.  Digital anorectal examination was then performed to assure proper positioning of the band, and to adjust the banded tissue as required.  The patient was discharged home without pain or other issues.  Dietary and behavioral recommendations were given and along with follow-up instructions.     The following adjunctive treatments were recommended:  Metamucil 1 tablet 2-3 times daily with meals Increase water intake 8-10 cups   The patient will return  follow-up and possible additional banding as needed No complications were encountered and the patient tolerated the procedure well.  Damaris Hippo , MD [phone]

## 2021-02-12 NOTE — Patient Instructions (Signed)

## 2021-03-19 ENCOUNTER — Other Ambulatory Visit: Payer: Self-pay | Admitting: Internal Medicine

## 2021-03-20 ENCOUNTER — Other Ambulatory Visit: Payer: Self-pay | Admitting: *Deleted

## 2021-03-20 MED ORDER — TRIAMTERENE-HCTZ 37.5-25 MG PO TABS: 1.0000 | ORAL_TABLET | Freq: Every day | ORAL | 1 refills | Status: DC

## 2021-03-20 NOTE — Telephone Encounter (Signed)
Rec'd paper fax for refill on triamterene/HCTZ. I have reviewed the patient's medical history pt is up-to-date sent refill to [facility].Marland KitchenJohny Chess

## 2021-04-09 ENCOUNTER — Other Ambulatory Visit: Payer: Self-pay | Admitting: Internal Medicine

## 2021-04-29 DIAGNOSIS — Z961 Presence of intraocular lens: Secondary | ICD-10-CM | POA: Diagnosis not present

## 2021-04-29 DIAGNOSIS — H40003 Preglaucoma, unspecified, bilateral: Secondary | ICD-10-CM | POA: Diagnosis not present

## 2021-05-05 ENCOUNTER — Telehealth: Payer: Self-pay | Admitting: *Deleted

## 2021-05-05 MED ORDER — DICLOFENAC SODIUM 75 MG PO TBEC: DELAYED_RELEASE_TABLET | ORAL | 3 refills | Status: DC

## 2021-05-05 NOTE — Telephone Encounter (Signed)
Reviewed chart pt is up-to-date sent refills to [facility].../lmb  

## 2021-05-11 ENCOUNTER — Other Ambulatory Visit: Payer: Self-pay

## 2021-05-11 NOTE — Assessment & Plan Note (Signed)
large colon polyp - OK bx 18 mm - another colonoscopy is pending in May 2022

## 2021-05-11 NOTE — Assessment & Plan Note (Signed)
A  cardiac CT scan for calcium scoring offered 2020 Continue on Maxide

## 2021-05-14 DIAGNOSIS — H938X1 Other specified disorders of right ear: Secondary | ICD-10-CM | POA: Insufficient documentation

## 2021-06-02 ENCOUNTER — Other Ambulatory Visit: Payer: Self-pay | Admitting: Internal Medicine

## 2021-06-09 DIAGNOSIS — Z85828 Personal history of other malignant neoplasm of skin: Secondary | ICD-10-CM | POA: Diagnosis not present

## 2021-06-09 DIAGNOSIS — L820 Inflamed seborrheic keratosis: Secondary | ICD-10-CM | POA: Diagnosis not present

## 2021-07-13 NOTE — Telephone Encounter (Signed)
No labs are in epic. Pt has Medicare. Pls advise.Marland KitchenJohny Chess

## 2021-07-15 ENCOUNTER — Other Ambulatory Visit: Payer: Self-pay | Admitting: Internal Medicine

## 2021-07-15 DIAGNOSIS — E785 Hyperlipidemia, unspecified: Secondary | ICD-10-CM

## 2021-07-15 DIAGNOSIS — F419 Anxiety disorder, unspecified: Secondary | ICD-10-CM

## 2021-07-15 DIAGNOSIS — M1990 Unspecified osteoarthritis, unspecified site: Secondary | ICD-10-CM

## 2021-07-15 DIAGNOSIS — I10 Essential (primary) hypertension: Secondary | ICD-10-CM

## 2021-07-16 ENCOUNTER — Other Ambulatory Visit (INDEPENDENT_AMBULATORY_CARE_PROVIDER_SITE_OTHER): Payer: Medicare Other

## 2021-07-16 DIAGNOSIS — M1990 Unspecified osteoarthritis, unspecified site: Secondary | ICD-10-CM

## 2021-07-16 DIAGNOSIS — E785 Hyperlipidemia, unspecified: Secondary | ICD-10-CM | POA: Diagnosis not present

## 2021-07-16 DIAGNOSIS — F419 Anxiety disorder, unspecified: Secondary | ICD-10-CM

## 2021-07-16 DIAGNOSIS — I10 Essential (primary) hypertension: Secondary | ICD-10-CM

## 2021-07-16 LAB — URINALYSIS
Bilirubin Urine: NEGATIVE
Hgb urine dipstick: NEGATIVE
Ketones, ur: NEGATIVE
Leukocytes,Ua: NEGATIVE
Nitrite: NEGATIVE
Specific Gravity, Urine: 1.015 (ref 1.000–1.030)
Total Protein, Urine: NEGATIVE
Urine Glucose: NEGATIVE
Urobilinogen, UA: 0.2 (ref 0.0–1.0)
pH: 8 (ref 5.0–8.0)

## 2021-07-16 LAB — CBC WITH DIFFERENTIAL/PLATELET
Basophils Absolute: 0 10*3/uL (ref 0.0–0.1)
Basophils Relative: 0.5 % (ref 0.0–3.0)
Eosinophils Absolute: 0.3 10*3/uL (ref 0.0–0.7)
Eosinophils Relative: 4.2 % (ref 0.0–5.0)
HCT: 42 % (ref 36.0–46.0)
Hemoglobin: 13.9 g/dL (ref 12.0–15.0)
Lymphocytes Relative: 38.4 % (ref 12.0–46.0)
Lymphs Abs: 2.4 10*3/uL (ref 0.7–4.0)
MCHC: 33 g/dL (ref 30.0–36.0)
MCV: 91.8 fl (ref 78.0–100.0)
Monocytes Absolute: 0.5 10*3/uL (ref 0.1–1.0)
Monocytes Relative: 8.9 % (ref 3.0–12.0)
Neutro Abs: 3 10*3/uL (ref 1.4–7.7)
Neutrophils Relative %: 48 % (ref 43.0–77.0)
Platelets: 250 10*3/uL (ref 150.0–400.0)
RBC: 4.57 Mil/uL (ref 3.87–5.11)
RDW: 13.8 % (ref 11.5–15.5)
WBC: 6.2 10*3/uL (ref 4.0–10.5)

## 2021-07-16 LAB — COMPREHENSIVE METABOLIC PANEL
ALT: 20 U/L (ref 0–35)
AST: 19 U/L (ref 0–37)
Albumin: 4.4 g/dL (ref 3.5–5.2)
Alkaline Phosphatase: 56 U/L (ref 39–117)
BUN: 20 mg/dL (ref 6–23)
CO2: 30 mEq/L (ref 19–32)
Calcium: 9.4 mg/dL (ref 8.4–10.5)
Chloride: 101 mEq/L (ref 96–112)
Creatinine, Ser: 0.75 mg/dL (ref 0.40–1.20)
GFR: 79.47 mL/min (ref >60.00)
Glucose, Bld: 88 mg/dL (ref 70–99)
Potassium: 3.5 mEq/L (ref 3.5–5.1)
Sodium: 139 mEq/L (ref 135–145)
Total Bilirubin: 0.6 mg/dL (ref 0.2–1.2)
Total Protein: 7 g/dL (ref 6.0–8.3)

## 2021-07-16 LAB — LIPID PANEL
Cholesterol: 221 mg/dL — ABNORMAL HIGH (ref 0–200)
HDL: 72.5 mg/dL (ref >39.00)
LDL Cholesterol: 135 mg/dL — ABNORMAL HIGH (ref 0–99)
NonHDL: 148.08
Total CHOL/HDL Ratio: 3
Triglycerides: 67 mg/dL (ref 0.0–149.0)
VLDL: 13.4 mg/dL (ref 0.0–40.0)

## 2021-07-16 LAB — TSH: TSH: 2.24 u[IU]/mL (ref 0.35–5.50)

## 2021-07-22 ENCOUNTER — Encounter: Payer: Self-pay | Admitting: Internal Medicine

## 2021-07-22 ENCOUNTER — Ambulatory Visit (INDEPENDENT_AMBULATORY_CARE_PROVIDER_SITE_OTHER): Payer: Medicare Other | Admitting: Internal Medicine

## 2021-07-22 ENCOUNTER — Other Ambulatory Visit: Payer: Self-pay

## 2021-07-22 DIAGNOSIS — I10 Essential (primary) hypertension: Secondary | ICD-10-CM

## 2021-07-22 DIAGNOSIS — E785 Hyperlipidemia, unspecified: Secondary | ICD-10-CM | POA: Diagnosis not present

## 2021-07-22 DIAGNOSIS — Z86010 Personal history of colonic polyps: Secondary | ICD-10-CM | POA: Diagnosis not present

## 2021-07-22 DIAGNOSIS — I700 Atherosclerosis of aorta: Secondary | ICD-10-CM | POA: Diagnosis not present

## 2021-07-22 DIAGNOSIS — F419 Anxiety disorder, unspecified: Secondary | ICD-10-CM

## 2021-07-22 MED ORDER — DICLOFENAC SODIUM 75 MG PO TBEC: DELAYED_RELEASE_TABLET | ORAL | 0 refills | Status: DC

## 2021-07-22 MED ORDER — ALPRAZOLAM 0.25 MG PO TABS: ORAL_TABLET | ORAL | 1 refills | Status: DC

## 2021-07-22 MED ORDER — POTASSIUM CHLORIDE ER 8 MEQ PO TBCR: 8.0000 meq | EXTENDED_RELEASE_TABLET | Freq: Every day | ORAL | 3 refills | Status: DC

## 2021-07-22 MED ORDER — METOPROLOL SUCCINATE ER 25 MG PO TB24: 25.0000 mg | ORAL_TABLET | Freq: Two times a day (BID) | ORAL | 3 refills | Status: DC

## 2021-07-22 MED ORDER — ZOLPIDEM TARTRATE 5 MG PO TABS
ORAL_TABLET | ORAL | 1 refills | Status: DC
Start: 2021-07-22 — End: 2021-12-09

## 2021-07-22 MED ORDER — TRIAMTERENE-HCTZ 37.5-25 MG PO TABS: 1.0000 | ORAL_TABLET | Freq: Every day | ORAL | 3 refills | Status: DC

## 2021-07-22 NOTE — Assessment & Plan Note (Signed)
Coronary calcium CT score of 57 Intolerant  of fish oil, ASA - bruising Declined statins

## 2021-07-22 NOTE — Assessment & Plan Note (Signed)
Colonoscopy is pending

## 2021-07-22 NOTE — Assessment & Plan Note (Signed)
Coronary calcium CT score of 57 A  cardiac CT scan for calcium scoring offered 2020: Coronary calcium score of 57 Intolerant  of fish oil, ASA - bruising Declined statins

## 2021-07-22 NOTE — Progress Notes (Signed)
Subjective:  Patient ID: Sue [name], female    DOB: 04/16/1951  Age: 70 y.o. MRN: [id number]  CC: Annual Exam   HPI Sue [name] presents for HTN, insomnia, anxiety  Outpatient Medications Prior to Visit  Medication Sig Dispense Refill   Esterified Estrogens 0.3 MG tablet Take 0.5 tablets (0.15 mg total) by mouth daily. 45 tablet 4   ESTRACE VAGINAL 0.1 MG/GM vaginal cream Place 9.93 Applicatorfuls vaginally once a week. 42.5 g 2   Multiple Vitamin (MULTIVITAMIN) capsule Take 1 capsule by mouth daily.     polycarbophil (FIBERCON) 625 MG tablet Take 1,250 mg by mouth daily.     Potassium Gluconate 550 MG TABS Take 550 mg by mouth every other day.     Sodium Sulfate-Mag Sulfate-KCl (SUTAB) [phone] MG TABS Take 24 tablets by mouth as directed. 24 tablet 0   XIIDRA 5 % SOLN Place 1 drop into both eyes 2 (two) times daily.  2   ALPRAZolam (XANAX) 0.25 MG tablet TAKE 1 TABLET BY MOUTH THREE TIMES A DAY AS NEEDED FOR ANXIETY 90 tablet 1   diclofenac (VOLTAREN) 75 MG EC tablet Take 1 tablet by mouth daily 30 tablet 3   metoprolol succinate (TOPROL-XL) 25 MG 24 hr tablet Take 1 tablet (25 mg total) by mouth in the morning and at bedtime. 180 tablet 3   potassium chloride (KLOR-CON) 8 MEQ tablet TAKE 1 TABLET BY MOUTH EVERY DAY 90 tablet 2   triamterene-hydrochlorothiazide (MAXZIDE-25) 37.5-25 MG tablet Take 1 tablet by mouth daily. 90 tablet 1   zolpidem (AMBIEN) 5 MG tablet TAKE 1 TABLET BY MOUTH AT BEDTIME AS NEEDED FOR SLEEP 90 tablet 1   No facility-administered medications prior to visit.    ROS: Review of Systems  Constitutional:  Negative for activity change, appetite change, chills, fatigue and unexpected weight change.  HENT:  Negative for congestion, mouth sores and sinus pressure.   Eyes:  Negative for visual disturbance.  Respiratory:  Negative for cough and chest tightness.   Gastrointestinal:  Negative for abdominal pain and nausea.  Genitourinary:   Negative for difficulty urinating, frequency and vaginal pain.  Musculoskeletal:  Negative for back pain and gait problem.  Skin:  Negative for pallor and rash.  Neurological:  Negative for dizziness, tremors, weakness, numbness and headaches.  Psychiatric/Behavioral:  Negative for confusion, sleep disturbance and suicidal ideas.    Objective:  BP 138/82 (BP Location: Left Arm)   Pulse 60   Temp 98 F (36.7 C) (Oral)   Ht 5' 3.75" (1.619 m)   Wt 132 lb 3.2 oz (60 kg)   LMP  (LMP Unknown)   SpO2 97%   BMI 22.87 kg/m   BP Readings from Last 3 Encounters:  07/22/21 138/82  02/12/21 120/72  01/29/21 (!) 184/74    Wt Readings from Last 3 Encounters:  07/22/21 132 lb 3.2 oz (60 kg)  02/12/21 133 lb 8 oz (60.6 kg)  01/28/21 129 lb (58.5 kg)    Physical Exam Constitutional:      General: She is not in acute distress.    Appearance: She is well-developed.  HENT:     Head: Normocephalic.     Right Ear: External ear normal.     Left Ear: External ear normal.     Nose: Nose normal.  Eyes:     General:        Right eye: No discharge.        Left eye: No discharge.  Conjunctiva/sclera: Conjunctivae normal.     Pupils: Pupils are equal, round, and reactive to light.  Neck:     Thyroid: No thyromegaly.     Vascular: No JVD.     Trachea: No tracheal deviation.  Cardiovascular:     Rate and Rhythm: Normal rate and regular rhythm.     Heart sounds: Normal heart sounds.  Pulmonary:     Effort: No respiratory distress.     Breath sounds: No stridor. No wheezing.  Abdominal:     General: Bowel sounds are normal. There is no distension.     Palpations: Abdomen is soft. There is no mass.     Tenderness: There is no abdominal tenderness. There is no guarding or rebound.  Musculoskeletal:        General: No tenderness.     Cervical back: Normal range of motion and neck supple. No rigidity.  Lymphadenopathy:     Cervical: No cervical adenopathy.  Skin:    Findings: No  erythema or rash.  Neurological:     Mental Status: She is oriented to person, place, and time.     Cranial Nerves: No cranial nerve deficit.     Motor: No abnormal muscle tone.     Coordination: Coordination normal.     Deep Tendon Reflexes: Reflexes normal.  Psychiatric:        Behavior: Behavior normal.        Thought Content: Thought content normal.        Judgment: Judgment normal.    Lab Results  Component Value Date   WBC 6.2 07/16/2021   HGB 13.9 07/16/2021   HCT 42.0 07/16/2021   PLT 250.0 07/16/2021   GLUCOSE 88 07/16/2021   CHOL 221 (H) 07/16/2021   TRIG 67.0 07/16/2021   HDL 72.50 07/16/2021   LDLDIRECT 118.2 07/19/2013   LDLCALC 135 (H) 07/16/2021   ALT 20 07/16/2021   AST 19 07/16/2021   NA 139 07/16/2021   K 3.5 07/16/2021   CL 101 07/16/2021   CREATININE 0.75 07/16/2021   BUN 20 07/16/2021   CO2 30 07/16/2021   TSH 2.24 07/16/2021   INR 1.0 12/19/2020    No results found.  Assessment & Plan:   Problem List Items Addressed This Visit     Anxiety    Stable.  Alprazolam as needed-infrequent use  Potential benefits of a long term benzodiazepines  use as well as potential risks  and complications were explained to the patient and were aknowledged.      Relevant Medications   ALPRAZolam (XANAX) 0.25 MG tablet   Aortic atherosclerosis (HCC)    Coronary calcium CT score of 57 Intolerant  of fish oil, ASA - bruising Declined statins      Relevant Medications   metoprolol succinate (TOPROL-XL) 25 MG 24 hr tablet   triamterene-hydrochlorothiazide (MAXZIDE-25) 37.5-25 MG tablet   Dyslipidemia    Coronary calcium CT score of 57 A  cardiac CT scan for calcium scoring offered 2020: Coronary calcium score of 57 Intolerant  of fish oil, ASA - bruising Declined statins      History of colon polyps    Colonoscopy is pending      HTN (hypertension)    Coronary calcium CT score of 57 A  cardiac CT scan for calcium scoring offered 2020: Coronary  calcium score of 57 Intolerant  of fish oil, ASA - bruising Declined statins      Relevant Medications   metoprolol succinate (TOPROL-XL) 25 MG 24 hr  tablet   triamterene-hydrochlorothiazide (MAXZIDE-25) 37.5-25 MG tablet      Meds ordered this encounter  Medications   ALPRAZolam (XANAX) 0.25 MG tablet    Sig: TAKE 1 TABLET BY MOUTH THREE TIMES A DAY AS NEEDED FOR ANXIETY    Dispense:  90 tablet    Refill:  1    This request is for a new prescription for a controlled substance as required by Federal/State law.   metoprolol succinate (TOPROL-XL) 25 MG 24 hr tablet    Sig: Take 1 tablet (25 mg total) by mouth in the morning and at bedtime.    Dispense:  180 tablet    Refill:  3   potassium chloride (KLOR-CON) 8 MEQ tablet    Sig: Take 1 tablet (8 mEq total) by mouth daily.    Dispense:  90 tablet    Refill:  3   triamterene-hydrochlorothiazide (MAXZIDE-25) 37.5-25 MG tablet    Sig: Take 1 tablet by mouth daily.    Dispense:  90 tablet    Refill:  3   zolpidem (AMBIEN) 5 MG tablet    Sig: TAKE 1 TABLET BY MOUTH AT BEDTIME AS NEEDED FOR SLEEP    Dispense:  90 tablet    Refill:  1    This request is for a new prescription for a controlled substance as required by Federal/State law..   diclofenac (VOLTAREN) 75 MG EC tablet    Sig: Take 1 tablet by mouth daily prn pc    Dispense:  90 tablet    Refill:  0      Follow-up: Return in about 1 year (around 07/22/2022) for a follow-up visit.  Walker Kehr, MD

## 2021-07-26 NOTE — Assessment & Plan Note (Signed)
Stable.  Alprazolam as needed-infrequent use  Potential benefits of a long term benzodiazepines  use as well as potential risks  and complications were explained to the patient and were aknowledged.

## 2021-07-29 ENCOUNTER — Other Ambulatory Visit: Payer: Self-pay | Admitting: Obstetrics & Gynecology

## 2021-07-29 DIAGNOSIS — Z1231 Encounter for screening mammogram for malignant neoplasm of breast: Secondary | ICD-10-CM

## 2021-08-04 DIAGNOSIS — D2362 Other benign neoplasm of skin of left upper limb, including shoulder: Secondary | ICD-10-CM | POA: Diagnosis not present

## 2021-08-04 DIAGNOSIS — D692 Other nonthrombocytopenic purpura: Secondary | ICD-10-CM | POA: Diagnosis not present

## 2021-08-04 DIAGNOSIS — Z85828 Personal history of other malignant neoplasm of skin: Secondary | ICD-10-CM | POA: Diagnosis not present

## 2021-08-04 DIAGNOSIS — D2361 Other benign neoplasm of skin of right upper limb, including shoulder: Secondary | ICD-10-CM | POA: Diagnosis not present

## 2021-08-04 DIAGNOSIS — D2272 Melanocytic nevi of left lower limb, including hip: Secondary | ICD-10-CM | POA: Diagnosis not present

## 2021-08-04 DIAGNOSIS — L821 Other seborrheic keratosis: Secondary | ICD-10-CM | POA: Diagnosis not present

## 2021-08-04 DIAGNOSIS — D225 Melanocytic nevi of trunk: Secondary | ICD-10-CM | POA: Diagnosis not present

## 2021-09-15 ENCOUNTER — Ambulatory Visit
Admission: RE | Admit: 2021-09-15 | Discharge: 2021-09-15 | Disposition: A | Discharge status: Home / Self Care | Payer: Medicare Other | Source: Ambulatory Visit | Attending: Obstetrics & Gynecology | Admitting: Obstetrics & Gynecology

## 2021-09-15 DIAGNOSIS — Z1231 Encounter for screening mammogram for malignant neoplasm of breast: Secondary | ICD-10-CM | POA: Diagnosis not present

## 2021-09-30 ENCOUNTER — Encounter: Payer: Self-pay | Admitting: Obstetrics & Gynecology

## 2021-09-30 ENCOUNTER — Other Ambulatory Visit: Payer: Self-pay

## 2021-09-30 ENCOUNTER — Ambulatory Visit (INDEPENDENT_AMBULATORY_CARE_PROVIDER_SITE_OTHER): Payer: Medicare Other | Admitting: Obstetrics & Gynecology

## 2021-09-30 VITALS — BP 118/76

## 2021-09-30 DIAGNOSIS — N9089 Other specified noninflammatory disorders of vulva and perineum: Secondary | ICD-10-CM | POA: Diagnosis not present

## 2021-09-30 LAB — WET PREP FOR TRICH, YEAST, CLUE

## 2021-09-30 MED ORDER — CLOBETASOL PROPIONATE 0.05 % EX OINT: 1.0000 "application " | TOPICAL_OINTMENT | Freq: Two times a day (BID) | CUTANEOUS | 1 refills | Status: AC

## 2021-09-30 NOTE — Progress Notes (Signed)
° ° °  CHIRSTINA [name] 1951/07/03 [id number]        71 y.o.  G1P1L1  Single (Long term boyfriend died 3 yrs ago)  RP: Vulvo-vaginal irritation x a couple of weeks  HPI: Irritation at the entrance to the vagina x about 2 weeks.  No vaginal discharge.  Tried Estrogen cream, Neosporin... without improvement.  No PMB.  Abstinent.  No UTI Sx.  BMs normal.  No fever.   OB History  Gravida Para Term Preterm AB Living  1 1       1   SAB IAB Ectopic Multiple Live Births               # Outcome Date GA Lbr Len/2nd Weight Sex Delivery Anes PTL Lv  1 Para             Past medical history,surgical history, problem list, medications, allergies, family history and social history were all reviewed and documented in the EPIC chart.   Directed ROS with pertinent positives and negatives documented in the history of present illness/assessment and plan.  Exam:  Vitals:   09/30/21 1348  BP: 118/76   General appearance:  Normal  Gynecologic exam:  Vulva:  Erythematous patch at the Right introitus between the inner labia minora and the hymen.  Wet Prep:  Negative   Assessment/Plan:  71 y.o. G1P1   1. Vulvar irritation Irritation at the entrance to the vagina x about 2 weeks.  No vaginal discharge.  Tried Estrogen cream, Neosporin... without improvement.  No PMB.  Abstinent.  No UTI Sx.  BMs normal.  No fever.  On Gyn exam, the vulva showed an erythematous patch at the Right introitus between the inner labia minora and the hymen.  The Wet prep was Negative.  Probable contact dermatitis at the vulva/introitus.  Decision to treat with Clobetasol ointment.  No CI.  Usage reviewed and prescription sent to pharmacy. - WET PREP FOR [place], YEAST, CLUE  Other orders - clobetasol ointment (TEMOVATE) 0.05 %; Apply 1 application topically 2 (two) times daily for 14 days.   Princess Bruins MD, 2:04 PM 09/30/2021

## 2021-12-07 ENCOUNTER — Other Ambulatory Visit: Payer: Self-pay | Admitting: Internal Medicine

## 2021-12-08 NOTE — Telephone Encounter (Signed)
LOV: 07/22/21 ?

## 2021-12-09 DIAGNOSIS — U071 COVID-19: Secondary | ICD-10-CM | POA: Diagnosis not present

## 2021-12-11 ENCOUNTER — Ambulatory Visit (INDEPENDENT_AMBULATORY_CARE_PROVIDER_SITE_OTHER): Payer: Medicare Other

## 2021-12-11 DIAGNOSIS — Z0000 Encounter for general adult medical examination without abnormal findings: Secondary | ICD-10-CM | POA: Diagnosis not present

## 2021-12-11 NOTE — Progress Notes (Addendum)
? ?Subjective:  ? Sue [name] is a 71 y.o. female who presents for Medicare Annual (Subsequent) preventive examination. ? ? ?I connected with Nicki Reaper today by telephone and verified that I am speaking with the correct person using two identifiers. ?Location patient: home ?Location provider: work ?Persons participating in the virtual visit: patient, provider. ?  ?I discussed the limitations, risks, security and privacy concerns of performing an evaluation and management service by telephone and the availability of in person appointments. I also discussed with the patient that there may be a patient responsible charge related to this service. The patient expressed understanding and verbally consented to this telephonic visit.  ?  ?Interactive audio and video telecommunications were attempted between this provider and patient, however failed, due to patient having technical difficulties OR patient did not have access to video capability.  We continued and completed visit with audio only. ? ?  ?Review of Systems    ? ?Cardiac Risk Factors include: advanced age (>43mn, >>62women) ? ?   ?Objective:  ?  ?Today's Vitals  ? ?There is no height or weight on file to calculate BMI. ? ? ?  12/11/2021  ?  2:08 PM 01/29/2021  ?  9:15 AM 11/19/2020  ?  3:18 PM 07/31/2019  ?  3:43 PM 07/28/2016  ?  6:57 PM 07/22/2016  ? 11:22 AM 02/11/2016  ?  4:31 PM  ?Advanced Directives  ?Does Patient Have a Medical Advance Directive? Yes Yes Yes Yes Yes Yes No  ?Type of AParamed[place]of [place]olog[place] will Healthcare Power of Attorney Living will;Healthcare Power of Attorney Living will;Healthcare Power of [place]Living will [place] will   ?Does patient want to make changes to medical advance directive?   No - Patient declined No - Patient declined No - Patient declined    ?Copy of HPocahontasin Chart? No - copy requested Yes - validated  most recent copy scanned in chart (See row information) No - copy requested  No - copy requested No - copy requested   ? ? ?Current Medications (verified) ?Outpatient Encounter Medications as of 12/11/2021  ?Medication Sig  ? ALPRAZolam (XANAX) 0.25 MG tablet TAKE 1 TABLET BY MOUTH THREE TIMES A DAY AS NEEDED FOR ANXIETY  ? diclofenac (VOLTAREN) 75 MG EC tablet Take 1 tablet by mouth daily prn pc  ? Esterified Estrogens 0.3 MG tablet Take 0.5 tablets (0.15 mg total) by mouth daily.  ? ESTRACE VAGINAL 0.1 MG/GM vaginal cream Place 00.97Applicatorfuls vaginally once a week.  ? metoprolol succinate (TOPROL-XL) 25 MG 24 hr tablet Take 1 tablet (25 mg total) by mouth in the morning and at bedtime. (Patient taking differently: Take 25 mg by mouth daily.)  ? Multiple Vitamin (MULTIVITAMIN) capsule Take 1 capsule by mouth daily.  ? polycarbophil (FIBERCON) 625 MG tablet Take 1,250 mg by mouth daily.  ? potassium chloride (KLOR-CON) 8 MEQ tablet Take 1 tablet (8 mEq total) by mouth daily.  ? triamterene-hydrochlorothiazide (MAXZIDE-25) 37.5-25 MG tablet Take 1 tablet by mouth daily.  ? XIIDRA 5 % SOLN Place 1 drop into both eyes 2 (two) times daily.  ? zolpidem (AMBIEN) 5 MG tablet TAKE 1 TABLET BY MOUTH EVERY DAY AT BEDTIME AS NEEDED FOR SLEEP  ? ?No facility-administered encounter medications on file as of 12/11/2021.  ? ? ?Allergies (verified) ?Clindamycin, Aspirin, Fish oil, Latex, and Penicillins  ? ?History: ?Past Medical History:  ?Diagnosis Date  ?  Anxiety   ? Cataract   ? Diverticulitis   ? FRACTURE, ARM, LEFT ~ 1960  ? FRACTURE, FOOT ~ 1960  ? FRACTURE, INDEX FINGER, LEFT ~ 1960  ? Heart murmur   ? patient denies this dx on 01/28/21  ? HYPERTENSION 11/15/2007  ? INSOMNIA, CHRONIC 06/17/2009  ? MITRAL VALVE PROLAPSE, HX OF   ? OSTEOARTHRITIS 11/15/2007  ? Subdural hematoma 03/2015  ? S/P fall  ? ?Past Surgical History:  ?Procedure Laterality Date  ? CATARACT EXTRACTION W/ INTRAOCULAR LENS IMPLANT Left 01/2015  ? COLON  RESECTION N/A 07/28/2016  ? Procedure: LAPAROSCOPIC ASSISTED  COLOSTOMY TAKEDOWN WITH COLORECTAL ANASTOMOSIS;  Surgeon: Mickeal Skinner, MD;  Location: [facility];  Service: General;  Laterality: N/A;  ? COLON SURGERY    ? COLONOSCOPY    ? COLONOSCOPY WITH PROPOFOL N/A 01/29/2021  ? Procedure: COLONOSCOPY WITH PROPOFOL;  Surgeon: Mansouraty, Telford Nab., MD;  Location: [facility];  Service: Gastroenterology;  Laterality: N/A;  ? COLOSTOMY TAKEDOWN  07/28/2016  ? laparoscopic converted to open take down of end colostomy with colo-rectostomy/notes 07/28/2016  ? ENDOSCOPIC MUCOSAL RESECTION N/A 01/29/2021  ? Procedure: ENDOSCOPIC MUCOSAL RESECTION;  Surgeon: Rush Landmark Telford Nab., MD;  Location: [facility];  Service: Gastroenterology;  Laterality: N/A;  ? EYE SURGERY    ? HEMOSTASIS CLIP PLACEMENT  01/29/2021  ? Procedure: HEMOSTASIS CLIP PLACEMENT;  Surgeon: Irving Copas., MD;  Location: [facility];  Service: Gastroenterology;;  ? PARTIAL COLECTOMY N/A 02/11/2016  ? Procedure: PARTIAL COLECTOMY with colostomy;  Surgeon: Mickeal Skinner, MD;  Location: [facility];  Service: General;  Laterality: N/A;  ? POLYPECTOMY  01/29/2021  ? Procedure: POLYPECTOMY;  Surgeon: Mansouraty, Telford Nab., MD;  Location: [facility];  Service: Gastroenterology;;  ? [place] INJECTION  01/29/2021  ? Procedure: SUBMUCOSAL LIFTING INJECTION;  Surgeon: Irving Copas., MD;  Location: [facility];  Service: Gastroenterology;;  ? TONSILLECTOMY    ? ?Family History  ?Problem Relation Age of Onset  ? Breast cancer Mother   ? Hypertension Father   ? Cancer Father   ?     LUNG  ? Parkinsonism Other   ? Colon cancer Neg Hx   ? Esophageal cancer Neg Hx   ? Colon polyps Neg Hx   ? Stomach cancer Neg Hx   ? Inflammatory bowel disease Neg Hx   ? Liver disease Neg Hx   ? Pancreatic cancer Neg Hx   ? Rectal cancer Neg Hx   ? ?Social History  ? ?Socioeconomic History  ? Marital status: Divorced  ?  Spouse name: Not on file   ? Number of children: 2  ? Years of education: Not on file  ? Highest education level: Not on file  ?Occupational History  ? Occupation: Home maker  ?Tobacco Use  ? Smoking status: Never  ? Smokeless tobacco: Never  ?Vaping Use  ? Vaping Use: Never used  ?Substance and Sexual Activity  ? Alcohol use: Yes  ?  Alcohol/week: 1.0 standard drink  ?  Types: 1 Glasses of wine per week  ?  Comment: occasional wine  ? Drug use: No  ? Sexual activity: Not Currently  ?  Partners: Male  ?  Birth control/protection: Post-menopausal  ?Other Topics Concern  ? Not on file  ?Social History Narrative  ? [place], [place]  ? Married '72-17 yrs then divorced; remarried '90 - 4 yrs divorced. 1 boy - '75; 1 daughter '83. Single partner since 2001. Socially active, riding horses  not hunting but jumping little jumps. Lost her mother Spring '12 - doing OK with loss.  ? ?Social Determinants of Health  ? ?Financial Resource Strain: Low Risk   ? Difficulty of Paying Living Expenses: Not hard at all  ?Food Insecurity: No Food Insecurity  ? Worried About Charity fundraiser in the Last Year: Never true  ? Ran Out of Food in the Last Year: Never true  ?Transportation Needs: No Transportation Needs  ? Lack of Transportation (Medical): No  ? Lack of Transportation (Non-Medical): No  ?Physical Activity: Sufficiently Active  ? Days of Exercise per Week: 7 days  ? Minutes of Exercise per Session: 60 min  ?Stress: No Stress Concern Present  ? Feeling of Stress : Not at all  ?Social Connections: Moderately Integrated  ? Frequency of Communication with Friends and Family: Twice a week  ? Frequency of Social Gatherings with Friends and Family: Twice a week  ? Attends Religious Services: More than 4 times per year  ? Active Member of Clubs or Organizations: Yes  ? Attends Archivist Meetings: 1 to 4 times per year  ? Marital Status: Divorced  ? ? ?Tobacco Counseling ?Counseling given: Not Answered ? ? ?Clinical Intake: ? ?Pre-visit  preparation completed: Yes ? ?Pain : No/denies pain ? ?  ? ?Nutritional Risks: None ?Diabetes: No ? ?How often do you need to have someone help you when you read instructions, pamphlets, or other written materia

## 2021-12-11 NOTE — Patient Instructions (Signed)
Ms. Curto , ?Thank you for taking time to come for your Medicare Wellness Visit. I appreciate your ongoing commitment to your health goals. Please review the following plan we discussed and let me know if I can assist you in the future.  ? ?Screening recommendations/referrals: ?Colonoscopy: Cologuard 08/18/2020 ?Mammogram: 09/15/2021 ?Bone Density: 11/03/2016 ?Recommended yearly ophthalmology/optometry visit for glaucoma screening and checkup ?Recommended yearly dental visit for hygiene and checkup ? ?Vaccinations: ?Influenza vaccine: completed  ?Pneumococcal vaccine: due  ?Tdap vaccine: due  ?Shingles vaccine: will consider    ? ?Advanced directives: yes  ? ?Conditions/risks identified: none  ? ?Next appointment: none  ? ? ?Preventive Care 20 Years and Older, Female ?Preventive care refers to lifestyle choices and visits with your health care provider that can promote health and wellness. ?What does preventive care include? ?A yearly physical exam. This is also called an annual well check. ?Dental exams once or twice a year. ?Routine eye exams. Ask your health care provider how often you should have your eyes checked. ?Personal lifestyle choices, including: ?Daily care of your teeth and gums. ?Regular physical activity. ?Eating a healthy diet. ?Avoiding tobacco and drug use. ?Limiting alcohol use. ?Practicing safe sex. ?Taking low-dose aspirin every day. ?Taking vitamin and mineral supplements as recommended by your health care provider. ?What happens during an annual well check? ?The services and screenings done by your health care provider during your annual well check will depend on your age, overall health, lifestyle risk factors, and family history of disease. ?Counseling  ?Your health care provider may ask you questions about your: ?Alcohol use. ?Tobacco use. ?Drug use. ?Emotional well-being. ?Home and relationship well-being. ?Sexual activity. ?Eating habits. ?History of falls. ?Memory and ability to  understand (cognition). ?Work and work Statistician. ?Reproductive health. ?Screening  ?You may have the following tests or measurements: ?Height, weight, and BMI. ?Blood pressure. ?Lipid and cholesterol levels. These may be checked every 5 years, or more frequently if you are over 55 years old. ?Skin check. ?Lung cancer screening. You may have this screening every year starting at age 42 if you have a 30-pack-year history of smoking and currently smoke or have quit within the past 15 years. ?Fecal occult blood test (FOBT) of the stool. You may have this test every year starting at age 48. ?Flexible sigmoidoscopy or colonoscopy. You may have a sigmoidoscopy every 5 years or a colonoscopy every 10 years starting at age 65. ?Hepatitis C blood test. ?Hepatitis B blood test. ?Sexually transmitted disease (STD) testing. ?Diabetes screening. This is done by checking your blood sugar (glucose) after you have not eaten for a while (fasting). You may have this done every 1-3 years. ?Bone density scan. This is done to screen for osteoporosis. You may have this done starting at age 77. ?Mammogram. This may be done every 1-2 years. Talk to your health care provider about how often you should have regular mammograms. ?Talk with your health care provider about your test results, treatment options, and if necessary, the need for more tests. ?Vaccines  ?Your health care provider may recommend certain vaccines, such as: ?Influenza vaccine. This is recommended every year. ?Tetanus, diphtheria, and acellular pertussis (Tdap, Td) vaccine. You may need a Td booster every 10 years. ?Zoster vaccine. You may need this after age 40. ?Pneumococcal 13-valent conjugate (PCV13) vaccine. One dose is recommended after age 72. ?Pneumococcal polysaccharide (PPSV23) vaccine. One dose is recommended after age 83. ?Talk to your health care provider about which screenings and vaccines you need and  how often you need them. ?This information is not  intended to replace advice given to you by your health care provider. Make sure you discuss any questions you have with your health care provider. ?Document Released: 09/26/2015 Document Revised: 05/19/2016 Document Reviewed: 07/01/2015 ?Elsevier Interactive Patient Education ? 2017 [place]. ? ?Fall Prevention in the Home ?Falls can cause injuries. They can happen to people of all ages. There are many things you can do to make your home safe and to help prevent falls. ?What can I do on the outside of my home? ?Regularly fix the edges of walkways and driveways and fix any cracks. ?Remove anything that might make you trip as you walk through a door, such as a raised step or threshold. ?Trim any bushes or trees on the path to your home. ?Use bright outdoor lighting. ?Clear any walking paths of anything that might make someone trip, such as rocks or tools. ?Regularly check to see if handrails are loose or broken. Make sure that both sides of any steps have handrails. ?Any raised decks and porches should have guardrails on the edges. ?Have any leaves, snow, or ice cleared regularly. ?Use sand or salt on walking paths during winter. ?Clean up any spills in your garage right away. This includes oil or grease spills. ?What can I do in the bathroom? ?Use night lights. ?Install grab bars by the toilet and in the tub and shower. Do not use towel bars as grab bars. ?Use non-skid mats or decals in the tub or shower. ?If you need to sit down in the shower, use a plastic, non-slip stool. ?Keep the floor dry. Clean up any water that spills on the floor as soon as it happens. ?Remove soap buildup in the tub or shower regularly. ?Attach bath mats securely with double-sided non-slip rug tape. ?Do not have throw rugs and other things on the floor that can make you trip. ?What can I do in the bedroom? ?Use night lights. ?Make sure that you have a light by your bed that is easy to reach. ?Do not use any sheets or blankets that are  too big for your bed. They should not hang down onto the floor. ?Have a firm chair that has side arms. You can use this for support while you get dressed. ?Do not have throw rugs and other things on the floor that can make you trip. ?What can I do in the kitchen? ?Clean up any spills right away. ?Avoid walking on wet floors. ?Keep items that you use a lot in easy-to-reach places. ?If you need to reach something above you, use a strong step stool that has a grab bar. ?Keep electrical cords out of the way. ?Do not use floor polish or wax that makes floors slippery. If you must use wax, use non-skid floor wax. ?Do not have throw rugs and other things on the floor that can make you trip. ?What can I do with my stairs? ?Do not leave any items on the stairs. ?Make sure that there are handrails on both sides of the stairs and use them. Fix handrails that are broken or loose. Make sure that handrails are as long as the stairways. ?Check any carpeting to make sure that it is firmly attached to the stairs. Fix any carpet that is loose or worn. ?Avoid having throw rugs at the top or bottom of the stairs. If you do have throw rugs, attach them to the floor with carpet tape. ?Make sure that you have  a light switch at the top of the stairs and the bottom of the stairs. If you do not have them, ask someone to add them for you. ?What else can I do to help prevent falls? ?Wear shoes that: ?Do not have high heels. ?Have rubber bottoms. ?Are comfortable and fit you well. ?Are closed at the toe. Do not wear sandals. ?If you use a stepladder: ?Make sure that it is fully opened. Do not climb a closed stepladder. ?Make sure that both sides of the stepladder are locked into place. ?Ask someone to hold it for you, if possible. ?Clearly mark and make sure that you can see: ?Any grab bars or handrails. ?First and last steps. ?Where the edge of each step is. ?Use tools that help you move around (mobility aids) if they are needed. These  include: ?Canes. ?Walkers. ?Scooters. ?Crutches. ?Turn on the lights when you go into a dark area. Replace any light bulbs as soon as they burn out. ?Set up your furniture so you have a clear path. Avoid moving your fur

## 2022-01-26 ENCOUNTER — Ambulatory Visit (INDEPENDENT_AMBULATORY_CARE_PROVIDER_SITE_OTHER): Payer: Medicare Other | Admitting: Obstetrics & Gynecology

## 2022-01-26 ENCOUNTER — Encounter: Payer: Self-pay | Admitting: Obstetrics & Gynecology

## 2022-01-26 VITALS — BP 104/70 | HR 65 | Resp 16 | Ht 63.5 in | Wt 134.0 lb

## 2022-01-26 DIAGNOSIS — Z9189 Other specified personal risk factors, not elsewhere classified: Secondary | ICD-10-CM | POA: Diagnosis not present

## 2022-01-26 DIAGNOSIS — Z01419 Encounter for gynecological examination (general) (routine) without abnormal findings: Secondary | ICD-10-CM

## 2022-01-26 DIAGNOSIS — Z79890 Hormone replacement therapy (postmenopausal): Secondary | ICD-10-CM | POA: Diagnosis not present

## 2022-01-26 DIAGNOSIS — Z85828 Personal history of other malignant neoplasm of skin: Secondary | ICD-10-CM | POA: Diagnosis not present

## 2022-01-26 DIAGNOSIS — I788 Other diseases of capillaries: Secondary | ICD-10-CM | POA: Diagnosis not present

## 2022-01-26 DIAGNOSIS — L821 Other seborrheic keratosis: Secondary | ICD-10-CM | POA: Diagnosis not present

## 2022-01-26 DIAGNOSIS — D1801 Hemangioma of skin and subcutaneous tissue: Secondary | ICD-10-CM | POA: Diagnosis not present

## 2022-01-26 DIAGNOSIS — L812 Freckles: Secondary | ICD-10-CM | POA: Diagnosis not present

## 2022-01-26 MED ORDER — ESTRACE 0.1 MG/GM VA CREA: 0.2500 | TOPICAL_CREAM | VAGINAL | 2 refills | Status: DC

## 2022-01-26 MED ORDER — ESTERIFIED ESTROGENS 0.3 MG PO TABS: 0.1500 mg | ORAL_TABLET | Freq: Every day | ORAL | 4 refills | Status: DC

## 2022-01-26 NOTE — Progress Notes (Signed)
? ? ?Sue [name] [birth year] [id number] ? ? ?History:    71 y.o.  G1P1L2 Long standing boyfriend. ?  ?RP:  Established patient presenting for annual gyn exam  ?  ?HPI: Postmenopause, well on Menest 0.3 1/2 tab q2 days, refuses to d/c. Declined combining with Progesterone in the past.  Estrace cream once a week.  No PMB.  No pelvic pain.  Occasional vulvar irritation, responds very well to Clobetasol when needed.  Abstinent. H/O LEEP 2008.  Paps normal since then, last pap 12/2019 Negative.  Urine/BMs wnl.  Had a bowel resection because of perforation associated with diverticulitis in 2017. Breasts wnl. Mammo 09/2021 Negative.  BD normal in 2018. BMI 23.36. Good fitness with Pickle Ball and Pilates and healthy nutrition.  Colono 10/2020 with 3 Polyps removed. Health labs with Fam MD. ? ?Past medical history,surgical history, family history and social history were all reviewed and documented in the EPIC chart. ? ?Gynecologic History ?No LMP recorded (lmp unknown). Patient is postmenopausal. ? ?Obstetric History ?OB History  ?Gravida Para Term Preterm AB Living  ?'1 1       1  '$ ?SAB IAB Ectopic Multiple Live Births  ?           ?  ?# Outcome Date GA Lbr Len/2nd Weight Sex Delivery Anes PTL Lv  ?1 Para           ?  ?Obstetric Comments  ?1 sone + 1 adopted daughter  ? ? ? ?ROS: A ROS was performed and pertinent positives and negatives are included in the history. ? GENERAL: No fevers or chills. HEENT: No change in vision, no earache, sore throat or sinus congestion. NECK: No pain or stiffness. CARDIOVASCULAR: No chest pain or pressure. No palpitations. PULMONARY: No shortness of breath, cough or wheeze. GASTROINTESTINAL: No abdominal pain, nausea, vomiting or diarrhea, melena or bright red blood per rectum. GENITOURINARY: No urinary frequency, urgency, hesitancy or dysuria. MUSCULOSKELETAL: No joint or muscle pain, no back pain, no recent trauma. DERMATOLOGIC: No rash, no itching, no lesions. ENDOCRINE: No polyuria,  polydipsia, no heat or cold intolerance. No recent change in weight. HEMATOLOGICAL: No anemia or easy bruising or bleeding. NEUROLOGIC: No headache, seizures, numbness, tingling or weakness. PSYCHIATRIC: No depression, no loss of interest in normal activity or change in sleep pattern.  ?  ? ?Exam: ? ? ?BP 104/70   Pulse 65   Resp 16   Ht 5' 3.5" (1.613 m)   Wt 134 lb (60.8 kg)   LMP  (LMP Unknown)   BMI 23.36 kg/m?  ? ?Body mass index is 23.36 kg/m?. ? ?General appearance : Well developed well nourished female. No acute distress ?HEENT: Eyes: no retinal hemorrhage or exudates,  Neck supple, trachea midline, no carotid bruits, no thyroidmegaly ?Lungs: Clear to auscultation, no rhonchi or wheezes, or rib retractions  ?Heart: Regular rate and rhythm, no murmurs or gallops ?Breast:Examined in sitting and supine position were symmetrical in appearance, no palpable masses or tenderness,  no skin retraction, no nipple inversion, no nipple discharge, no skin discoloration, no axillary or supraclavicular lymphadenopathy ?Abdomen: no palpable masses or tenderness, no rebound or guarding ?Extremities: no edema or skin discoloration or tenderness ? ?Pelvic: Vulva: Normal ?            Vagina: No gross lesions or discharge ? Cervix: No gross lesions or discharge ? Uterus  AV, normal size, shape and consistency, non-tender and mobile ? Adnexa  Without masses or tenderness ? Anus: Normal ? ? ?  Assessment/Plan:  71 y.o. female for annual exam  ? ?1. Well female exam with routine gynecological exam ?Postmenopause, well on Menest 0.3 1/2 tab q2 days, refuses to d/c. Declined combining with Progesterone in the past.  Estrace cream once a week.  No PMB.  No pelvic pain.  Occasional vulvar irritation, responds very well to Clobetasol when needed.  Abstinent. H/O LEEP 2008.  Paps normal since then, last pap 12/2019 Negative.  Urine/BMs wnl.  Had a bowel resection because of perforation associated with diverticulitis in 2017. Breasts  wnl. Mammo 09/2021 Negative.  BD normal in 2018. BMI 23.36. Good fitness with Pickle Ball and Pilates and healthy nutrition.  Colono 10/2020 with 3 Polyps removed. Health labs with Fam MD. ? ?2. Postmenopausal hormone replacement therapy ?Postmenopause, well on Menest 0.3 1/2 tab q2 days, refuses to d/c. Declined combining with Progesterone in the past.  Estrace cream once a week.  No PMB.  No pelvic pain.  ? ?3. Other specified personal risk factors, not elsewhere classified ? ?Other orders ?- FINACEA 15 % gel; APPLY TO AFFECTED AREA TWICE A DAY ?- clobetasol ointment (TEMOVATE) 0.05 %; Apply 1 application. topically as needed. ?- Esterified Estrogens 0.3 MG tablet; Take 0.5 tablets (0.15 mg total) by mouth daily. ?- ESTRACE VAGINAL 0.1 MG/GM vaginal cream; Place 7.35 Applicatorfuls vaginally once a week.  ? ?Princess Bruins MD, 1:55 PM 01/26/2022 ? ?  ?

## 2022-02-16 ENCOUNTER — Telehealth: Payer: Self-pay | Admitting: Gastroenterology

## 2022-02-16 NOTE — Telephone Encounter (Signed)
FYI- patient would like to wait until November 2023 to have repeat colonoscopy.

## 2022-02-16 NOTE — Telephone Encounter (Signed)
Spoke with patient this afternoon. Pt requested to wait until November to have recall - colonoscopy schedule. Pt advised that she would need to contact office in August to schedule for Nov 2023. Pt voiced understanding.

## 2022-02-16 NOTE — Telephone Encounter (Signed)
Inbound call from patient stating she got a letter in the mail that it was time for her to schedule her colon procedure. Patient is required to have procedure at the hospital. Advised patient I would send a message over and have the CMA give her a call to schedule her. Patient stated she was in no rush to be scheduled that she will be out of town until the end October. Patient stated that she would wait for a call to either be put on a wait list for that time. Please advise.

## 2022-02-17 NOTE — Telephone Encounter (Signed)
Thank you for update. She certainly has the right and decision to decide when she is ready for procedure. Please put her recall for at least 1 month before November since we are always booking out so far in advance and I would like to get her done this year if possible. Thanks. GM

## 2022-05-05 DIAGNOSIS — Z961 Presence of intraocular lens: Secondary | ICD-10-CM | POA: Diagnosis not present

## 2022-05-05 DIAGNOSIS — H40013 Open angle with borderline findings, low risk, bilateral: Secondary | ICD-10-CM | POA: Diagnosis not present

## 2022-05-12 ENCOUNTER — Telehealth: Payer: Self-pay | Admitting: Gastroenterology

## 2022-05-12 NOTE — Telephone Encounter (Signed)
Inbound call from patient ready to schedule for hospital procedure. Please give a a call back to further advise.

## 2022-05-12 NOTE — Telephone Encounter (Signed)
The pt recall is for November and our schedule is not out that far.  She states she is in no hurry and will call back if she has not heard from this office by November.

## 2022-06-02 DIAGNOSIS — M25571 Pain in right ankle and joints of right foot: Secondary | ICD-10-CM | POA: Diagnosis not present

## 2022-06-17 ENCOUNTER — Other Ambulatory Visit: Payer: Self-pay | Admitting: Internal Medicine

## 2022-06-20 ENCOUNTER — Other Ambulatory Visit: Payer: Self-pay | Admitting: Internal Medicine

## 2022-06-30 DIAGNOSIS — M7661 Achilles tendinitis, right leg: Secondary | ICD-10-CM | POA: Diagnosis not present

## 2022-06-30 DIAGNOSIS — M25571 Pain in right ankle and joints of right foot: Secondary | ICD-10-CM | POA: Diagnosis not present

## 2022-06-30 DIAGNOSIS — M25671 Stiffness of right ankle, not elsewhere classified: Secondary | ICD-10-CM | POA: Diagnosis not present

## 2022-06-30 DIAGNOSIS — M62571 Muscle wasting and atrophy, not elsewhere classified, right ankle and foot: Secondary | ICD-10-CM | POA: Diagnosis not present

## 2022-06-30 DIAGNOSIS — R269 Unspecified abnormalities of gait and mobility: Secondary | ICD-10-CM | POA: Diagnosis not present

## 2022-07-03 ENCOUNTER — Other Ambulatory Visit: Payer: Self-pay | Admitting: Internal Medicine

## 2022-07-05 ENCOUNTER — Ambulatory Visit (INDEPENDENT_AMBULATORY_CARE_PROVIDER_SITE_OTHER): Payer: Medicare Other | Admitting: Podiatry

## 2022-07-05 ENCOUNTER — Encounter: Payer: Self-pay | Admitting: Podiatry

## 2022-07-05 DIAGNOSIS — L84 Corns and callosities: Secondary | ICD-10-CM

## 2022-07-05 DIAGNOSIS — M7661 Achilles tendinitis, right leg: Secondary | ICD-10-CM

## 2022-07-05 NOTE — Patient Instructions (Signed)

## 2022-07-05 NOTE — Progress Notes (Signed)
Subjective:   Patient ID: Sue [name], female   DOB: 71 y.o.   MRN: [id number]   HPI Pain patient presents with a painful lesion bottom of both feet and also concerned about her Achilles tendon right and states she [place]   ROS      Objective:  Physical Exam  Neurovascular status intact muscle strength adequate keratotic lesions submetatarsal bilateral lucent and painful left over right and inflammation at the muscle tendon junction Achilles right     Assessment:  Porokeratotic lesion bilateral along with Achilles tendinitis right     Plan:  Reviewed both conditions debrided lesions no iatrogenic bleeding and for the right recommended heat ice therapy stretching exercises heel lift which I dispensed and continuation of nitro patches and I did tell her to wear for shorter periods of time and see if she can accommodate them.  If the it continues we will need to see her back

## 2022-07-06 DIAGNOSIS — M7661 Achilles tendinitis, right leg: Secondary | ICD-10-CM | POA: Diagnosis not present

## 2022-07-06 DIAGNOSIS — M25671 Stiffness of right ankle, not elsewhere classified: Secondary | ICD-10-CM | POA: Diagnosis not present

## 2022-07-06 DIAGNOSIS — R269 Unspecified abnormalities of gait and mobility: Secondary | ICD-10-CM | POA: Diagnosis not present

## 2022-07-06 DIAGNOSIS — M25571 Pain in right ankle and joints of right foot: Secondary | ICD-10-CM | POA: Diagnosis not present

## 2022-07-06 DIAGNOSIS — M62571 Muscle wasting and atrophy, not elsewhere classified, right ankle and foot: Secondary | ICD-10-CM | POA: Diagnosis not present

## 2022-07-14 DIAGNOSIS — M545 Low back pain, unspecified: Secondary | ICD-10-CM | POA: Diagnosis not present

## 2022-07-14 DIAGNOSIS — R030 Elevated blood-pressure reading, without diagnosis of hypertension: Secondary | ICD-10-CM | POA: Diagnosis not present

## 2022-07-15 ENCOUNTER — Telehealth: Payer: Self-pay | Admitting: Internal Medicine

## 2022-07-15 DIAGNOSIS — I10 Essential (primary) hypertension: Secondary | ICD-10-CM

## 2022-07-15 DIAGNOSIS — E785 Hyperlipidemia, unspecified: Secondary | ICD-10-CM

## 2022-07-15 DIAGNOSIS — M5136 Other intervertebral disc degeneration, lumbar region: Secondary | ICD-10-CM | POA: Diagnosis not present

## 2022-07-15 DIAGNOSIS — M9903 Segmental and somatic dysfunction of lumbar region: Secondary | ICD-10-CM | POA: Diagnosis not present

## 2022-07-15 NOTE — Telephone Encounter (Signed)
Patient called in and rescheduled her yealry appointment from 07/27/22 to 08/02/22. Patient wants to know if she needs to come in for lab work prior. Please advise

## 2022-07-15 NOTE — Telephone Encounter (Signed)
PT HAS MEDICARE...Sue [name]

## 2022-07-16 DIAGNOSIS — M545 Low back pain, unspecified: Secondary | ICD-10-CM | POA: Diagnosis not present

## 2022-07-16 NOTE — Telephone Encounter (Signed)
Okay.  Labs ordered.  Thanks 

## 2022-07-16 NOTE — Telephone Encounter (Signed)
Notified pt w/MD response. Appt 07/29/22.Marland KitchenJohny Chess

## 2022-07-19 DIAGNOSIS — M5136 Other intervertebral disc degeneration, lumbar region: Secondary | ICD-10-CM | POA: Diagnosis not present

## 2022-07-19 DIAGNOSIS — M9903 Segmental and somatic dysfunction of lumbar region: Secondary | ICD-10-CM | POA: Diagnosis not present

## 2022-07-20 DIAGNOSIS — M545 Low back pain, unspecified: Secondary | ICD-10-CM | POA: Diagnosis not present

## 2022-07-21 DIAGNOSIS — M545 Low back pain, unspecified: Secondary | ICD-10-CM | POA: Diagnosis not present

## 2022-07-21 DIAGNOSIS — M5126 Other intervertebral disc displacement, lumbar region: Secondary | ICD-10-CM | POA: Diagnosis not present

## 2022-07-26 DIAGNOSIS — D1801 Hemangioma of skin and subcutaneous tissue: Secondary | ICD-10-CM | POA: Diagnosis not present

## 2022-07-26 DIAGNOSIS — Z85828 Personal history of other malignant neoplasm of skin: Secondary | ICD-10-CM | POA: Diagnosis not present

## 2022-07-26 DIAGNOSIS — L812 Freckles: Secondary | ICD-10-CM | POA: Diagnosis not present

## 2022-07-26 DIAGNOSIS — L570 Actinic keratosis: Secondary | ICD-10-CM | POA: Diagnosis not present

## 2022-07-26 DIAGNOSIS — M5136 Other intervertebral disc degeneration, lumbar region: Secondary | ICD-10-CM | POA: Diagnosis not present

## 2022-07-26 DIAGNOSIS — M9903 Segmental and somatic dysfunction of lumbar region: Secondary | ICD-10-CM | POA: Diagnosis not present

## 2022-07-26 DIAGNOSIS — L821 Other seborrheic keratosis: Secondary | ICD-10-CM | POA: Diagnosis not present

## 2022-07-27 ENCOUNTER — Ambulatory Visit: Payer: Medicare Other | Admitting: Internal Medicine

## 2022-07-27 DIAGNOSIS — M6281 Muscle weakness (generalized): Secondary | ICD-10-CM | POA: Diagnosis not present

## 2022-07-27 DIAGNOSIS — M5416 Radiculopathy, lumbar region: Secondary | ICD-10-CM | POA: Diagnosis not present

## 2022-07-29 ENCOUNTER — Other Ambulatory Visit (INDEPENDENT_AMBULATORY_CARE_PROVIDER_SITE_OTHER): Payer: Medicare Other

## 2022-07-29 DIAGNOSIS — M9903 Segmental and somatic dysfunction of lumbar region: Secondary | ICD-10-CM | POA: Diagnosis not present

## 2022-07-29 DIAGNOSIS — I10 Essential (primary) hypertension: Secondary | ICD-10-CM

## 2022-07-29 DIAGNOSIS — M6281 Muscle weakness (generalized): Secondary | ICD-10-CM | POA: Diagnosis not present

## 2022-07-29 DIAGNOSIS — E785 Hyperlipidemia, unspecified: Secondary | ICD-10-CM | POA: Diagnosis not present

## 2022-07-29 DIAGNOSIS — M5416 Radiculopathy, lumbar region: Secondary | ICD-10-CM | POA: Diagnosis not present

## 2022-07-29 DIAGNOSIS — M5136 Other intervertebral disc degeneration, lumbar region: Secondary | ICD-10-CM | POA: Diagnosis not present

## 2022-07-29 LAB — CBC WITH DIFFERENTIAL/PLATELET
Basophils Absolute: 0 10*3/uL (ref 0.0–0.1)
Basophils Relative: 0.7 % (ref 0.0–3.0)
Eosinophils Absolute: 0.3 10*3/uL (ref 0.0–0.7)
Eosinophils Relative: 3.4 % (ref 0.0–5.0)
HCT: 44.4 % (ref 36.0–46.0)
Hemoglobin: 14.9 g/dL (ref 12.0–15.0)
Lymphocytes Relative: 25.7 % (ref 12.0–46.0)
Lymphs Abs: 1.9 10*3/uL (ref 0.7–4.0)
MCHC: 33.5 g/dL (ref 30.0–36.0)
MCV: 92.4 fl (ref 78.0–100.0)
Monocytes Absolute: 0.7 10*3/uL (ref 0.1–1.0)
Monocytes Relative: 9.3 % (ref 3.0–12.0)
Neutro Abs: 4.6 10*3/uL (ref 1.4–7.7)
Neutrophils Relative %: 60.9 % (ref 43.0–77.0)
Platelets: 305 10*3/uL (ref 150.0–400.0)
RBC: 4.81 Mil/uL (ref 3.87–5.11)
RDW: 14.5 % (ref 11.5–15.5)
WBC: 7.5 10*3/uL (ref 4.0–10.5)

## 2022-07-29 LAB — URINALYSIS
Bilirubin Urine: NEGATIVE
Hgb urine dipstick: NEGATIVE
Ketones, ur: NEGATIVE
Leukocytes,Ua: NEGATIVE
Nitrite: NEGATIVE
Specific Gravity, Urine: 1.015 (ref 1.000–1.030)
Total Protein, Urine: NEGATIVE
Urine Glucose: NEGATIVE
Urobilinogen, UA: 0.2 (ref 0.0–1.0)
pH: 7.5 (ref 5.0–8.0)

## 2022-07-29 LAB — COMPREHENSIVE METABOLIC PANEL
ALT: 33 U/L (ref 0–35)
AST: 26 U/L (ref 0–37)
Albumin: 4.6 g/dL (ref 3.5–5.2)
Alkaline Phosphatase: 57 U/L (ref 39–117)
BUN: 19 mg/dL (ref 6–23)
CO2: 33 mEq/L — ABNORMAL HIGH (ref 19–32)
Calcium: 10.1 mg/dL (ref 8.4–10.5)
Chloride: 100 mEq/L (ref 96–112)
Creatinine, Ser: 0.74 mg/dL (ref 0.40–1.20)
GFR: 80.17 mL/min (ref >60.00)
Glucose, Bld: 95 mg/dL (ref 70–99)
Potassium: 4.2 mEq/L (ref 3.5–5.1)
Sodium: 138 mEq/L (ref 135–145)
Total Bilirubin: 0.5 mg/dL (ref 0.2–1.2)
Total Protein: 7.2 g/dL (ref 6.0–8.3)

## 2022-07-29 LAB — LIPID PANEL
Cholesterol: 230 mg/dL — ABNORMAL HIGH (ref 0–200)
HDL: 74.9 mg/dL (ref >39.00)
LDL Cholesterol: 144 mg/dL — ABNORMAL HIGH (ref 0–99)
NonHDL: 155.28
Total CHOL/HDL Ratio: 3
Triglycerides: 56 mg/dL (ref 0.0–149.0)
VLDL: 11.2 mg/dL (ref 0.0–40.0)

## 2022-07-29 LAB — TSH: TSH: 1.43 u[IU]/mL (ref 0.35–5.50)

## 2022-08-02 ENCOUNTER — Encounter: Payer: Self-pay | Admitting: Internal Medicine

## 2022-08-02 ENCOUNTER — Ambulatory Visit (INDEPENDENT_AMBULATORY_CARE_PROVIDER_SITE_OTHER): Payer: Medicare Other | Admitting: Internal Medicine

## 2022-08-02 VITALS — BP 144/82 | HR 60 | Temp 98.2°F | Ht 63.5 in | Wt 121.0 lb

## 2022-08-02 DIAGNOSIS — I2510 Atherosclerotic heart disease of native coronary artery without angina pectoris: Secondary | ICD-10-CM

## 2022-08-02 DIAGNOSIS — G4700 Insomnia, unspecified: Secondary | ICD-10-CM

## 2022-08-02 DIAGNOSIS — E785 Hyperlipidemia, unspecified: Secondary | ICD-10-CM

## 2022-08-02 DIAGNOSIS — I2583 Coronary atherosclerosis due to lipid rich plaque: Secondary | ICD-10-CM | POA: Diagnosis not present

## 2022-08-02 DIAGNOSIS — F419 Anxiety disorder, unspecified: Secondary | ICD-10-CM

## 2022-08-02 DIAGNOSIS — I700 Atherosclerosis of aorta: Secondary | ICD-10-CM | POA: Diagnosis not present

## 2022-08-02 DIAGNOSIS — I10 Essential (primary) hypertension: Secondary | ICD-10-CM | POA: Diagnosis not present

## 2022-08-02 MED ORDER — TRIAMTERENE-HCTZ 37.5-25 MG PO TABS: 1.0000 | ORAL_TABLET | Freq: Every day | ORAL | 3 refills | Status: DC

## 2022-08-02 MED ORDER — DICLOFENAC SODIUM 75 MG PO TBEC: DELAYED_RELEASE_TABLET | ORAL | 1 refills | Status: DC

## 2022-08-02 MED ORDER — ALPRAZOLAM 0.25 MG PO TABS: ORAL_TABLET | ORAL | 1 refills | Status: DC

## 2022-08-02 MED ORDER — POTASSIUM CHLORIDE ER 8 MEQ PO TBCR: 8.0000 meq | EXTENDED_RELEASE_TABLET | Freq: Every day | ORAL | 3 refills | Status: DC

## 2022-08-02 MED ORDER — METOPROLOL SUCCINATE ER 25 MG PO TB24: 25.0000 mg | ORAL_TABLET | Freq: Two times a day (BID) | ORAL | 3 refills | Status: DC

## 2022-08-02 NOTE — Assessment & Plan Note (Signed)
Look at Vit D3, K2, MK7 vitamins

## 2022-08-02 NOTE — Assessment & Plan Note (Signed)
  Zolpidem 5-10 mgas needed Not to take w/Xanax  Potential benefits of a long term benzodiazepines  use as well as potential risks  and complications were explained to the patient and were aknowledged.

## 2022-08-02 NOTE — Assessment & Plan Note (Addendum)
On Toprol, Maxzide BP was up due to LBP

## 2022-08-02 NOTE — Assessment & Plan Note (Signed)
On Alprazolam as needed-infrequent use  Potential benefits of a long term benzodiazepines  use as well as potential risks  and complications were explained to the patient and were aknowledged.

## 2022-08-02 NOTE — Progress Notes (Signed)
Subjective:  Patient ID: Sue [name], female    DOB: Mar 12, 1951  Age: 71 y.o. MRN: [id number]  CC: Annual Exam   HPI Sue [name] presents for a LBP on the R - Dr Tobi Bastos BP was up due to LBP F/u on insomnia, anxiety  Outpatient Medications Prior to Visit  Medication Sig Dispense Refill   baclofen (LIORESAL) 10 MG tablet Take 10 mg by mouth 3 (three) times daily.     clobetasol ointment (TEMOVATE) 5.10 % Apply 1 application. topically as needed.     diazepam (VALIUM) 5 MG tablet Take 5 mg by mouth every 6 (six) hours as needed.     Esterified Estrogens 0.3 MG tablet Take 0.5 tablets (0.15 mg total) by mouth daily. 45 tablet 4   ESTRACE VAGINAL 0.1 MG/GM vaginal cream Place 2.58 Applicatorfuls vaginally once a week. 42.5 g 2   FINACEA 15 % gel APPLY TO AFFECTED AREA TWICE A DAY     meloxicam (MOBIC) 7.5 MG tablet Take 7.5 mg by mouth 2 (two) times daily as needed.     methylPREDNISolone (MEDROL DOSEPAK) 4 MG TBPK tablet Take 4 mg by mouth daily.     Multiple Vitamin (MULTIVITAMIN) capsule Take 1 capsule by mouth daily.     nitroGLYCERIN (NITRODUR - DOSED IN MG/24 HR) 0.2 mg/hr patch Place 0.2 mg onto the skin daily.     polycarbophil (FIBERCON) 625 MG tablet Take 1,250 mg by mouth daily.     traMADol (ULTRAM) 50 MG tablet Take 50 mg by mouth 2 (two) times daily.     XIIDRA 5 % SOLN Place 1 drop into both eyes 2 (two) times daily.  2   zolpidem (AMBIEN) 5 MG tablet TAKE 1 TABLET BY MOUTH EVERY DAY AT BEDTIME AS NEEDED FOR SLEEP 90 tablet 1   ALPRAZolam (XANAX) 0.25 MG tablet TAKE 1 TABLET BY MOUTH THREE TIMES A DAY AS NEEDED FOR ANXIETY 90 tablet 0   diclofenac (VOLTAREN) 75 MG EC tablet TAKE 1 TABLET BY MOUTH EVERY DAY ANNUAL APPT DUE IN NOV MUST SEE PROVIDER FOR FUTURE REFILLS 30 tablet 0   metoprolol succinate (TOPROL-XL) 25 MG 24 hr tablet Take 1 tablet (25 mg total) by mouth in the morning and at bedtime. (Patient taking differently: Take 25 mg by mouth daily.) 180  tablet 3   potassium chloride (KLOR-CON) 8 MEQ tablet Take 1 tablet (8 mEq total) by mouth daily. 90 tablet 3   triamterene-hydrochlorothiazide (MAXZIDE-25) 37.5-25 MG tablet Take 1 tablet by mouth daily. 90 tablet 3   No facility-administered medications prior to visit.    ROS: Review of Systems  Objective:  BP (!) 144/82 (BP Location: Right Arm, Patient Position: Sitting, Cuff Size: Normal)   Pulse 60   Temp 98.2 F (36.8 C) (Oral)   Ht 5' 3.5" (1.613 m)   Wt 121 lb (54.9 kg)   LMP  (LMP Unknown)   SpO2 99%   BMI 21.10 kg/m   BP Readings from Last 3 Encounters:  08/02/22 (!) 144/82  01/26/22 104/70  09/30/21 118/76    Wt Readings from Last 3 Encounters:  08/02/22 121 lb (54.9 kg)  01/26/22 134 lb (60.8 kg)  07/22/21 132 lb 3.2 oz (60 kg)    Physical Exam  Lab Results  Component Value Date   WBC 7.5 07/29/2022   HGB 14.9 07/29/2022   HCT 44.4 07/29/2022   PLT 305.0 07/29/2022   GLUCOSE 95 07/29/2022   CHOL 230 (H) 07/29/2022  TRIG 56.0 07/29/2022   HDL 74.90 07/29/2022   LDLDIRECT 118.2 07/19/2013   LDLCALC 144 (H) 07/29/2022   ALT 33 07/29/2022   AST 26 07/29/2022   NA 138 07/29/2022   K 4.2 07/29/2022   CL 100 07/29/2022   CREATININE 0.74 07/29/2022   BUN 19 07/29/2022   CO2 33 (H) 07/29/2022   TSH 1.43 07/29/2022   INR 1.0 12/19/2020    MM 3D SCREEN BREAST BILATERAL  Result Date: 09/15/2021 CLINICAL DATA:  Screening. EXAM: DIGITAL SCREENING BILATERAL MAMMOGRAM WITH TOMOSYNTHESIS AND CAD TECHNIQUE: Bilateral screening digital craniocaudal and mediolateral oblique mammograms were obtained. Bilateral screening digital breast tomosynthesis was performed. The images were evaluated with computer-aided detection. COMPARISON:  Previous exam(s). ACR Breast Density Category d: The breast tissue is extremely dense, which lowers the sensitivity of mammography FINDINGS: There are no findings suspicious for malignancy. IMPRESSION: No mammographic evidence of  malignancy. A result letter of this screening mammogram will be mailed directly to the patient. RECOMMENDATION: Screening mammogram in one year. (Code:SM-B-01Y) BI-RADS CATEGORY  1: Negative. Electronically Signed   By: Abelardo Diesel M.D.   On: 09/15/2021 13:19    Assessment & Plan:   Problem List Items Addressed This Visit     INSOMNIA, CHRONIC     Zolpidem 5-10 mgas needed Not to take w/Xanax  Potential benefits of a long term benzodiazepines  use as well as potential risks  and complications were explained to the patient and were aknowledged.      HTN (hypertension) - Primary    On Toprol, Maxzide BP was up due to LBP      Relevant Medications   nitroGLYCERIN (NITRODUR - DOSED IN MG/24 HR) 0.2 mg/hr patch   metoprolol succinate (TOPROL-XL) 25 MG 24 hr tablet   triamterene-hydrochlorothiazide (MAXZIDE-25) 37.5-25 MG tablet   Anxiety    On Alprazolam as needed-infrequent use  Potential benefits of a long term benzodiazepines  use as well as potential risks  and complications were explained to the patient and were aknowledged.      Relevant Medications   diazepam (VALIUM) 5 MG tablet   ALPRAZolam (XANAX) 0.25 MG tablet   Dyslipidemia    Look at Vit D3, K2, MK7 vitamins      Coronary atherosclerosis    Look at Vit D3, K2, MK7 vitamins      Relevant Medications   nitroGLYCERIN (NITRODUR - DOSED IN MG/24 HR) 0.2 mg/hr patch   metoprolol succinate (TOPROL-XL) 25 MG 24 hr tablet   triamterene-hydrochlorothiazide (MAXZIDE-25) 37.5-25 MG tablet   Aortic atherosclerosis (HCC)    Look at Vit D3, K2, MK7 vitamins      Relevant Medications   nitroGLYCERIN (NITRODUR - DOSED IN MG/24 HR) 0.2 mg/hr patch   metoprolol succinate (TOPROL-XL) 25 MG 24 hr tablet   triamterene-hydrochlorothiazide (MAXZIDE-25) 37.5-25 MG tablet      Meds ordered this encounter  Medications   ALPRAZolam (XANAX) 0.25 MG tablet    Sig: TAKE 1 TABLET BY MOUTH THREE TIMES A DAY AS NEEDED FOR ANXIETY     Dispense:  90 tablet    Refill:  1   diclofenac (VOLTAREN) 75 MG EC tablet    Sig: TAKE 1 TABLET BY MOUTH EVERY DAY prn    Dispense:  30 tablet    Refill:  1   metoprolol succinate (TOPROL-XL) 25 MG 24 hr tablet    Sig: Take 1 tablet (25 mg total) by mouth in the morning and at bedtime.    Dispense:  180 tablet    Refill:  3   potassium chloride (KLOR-CON) 8 MEQ tablet    Sig: Take 1 tablet (8 mEq total) by mouth daily.    Dispense:  90 tablet    Refill:  3   triamterene-hydrochlorothiazide (MAXZIDE-25) 37.5-25 MG tablet    Sig: Take 1 tablet by mouth daily.    Dispense:  90 tablet    Refill:  3      Follow-up: Return in about 1 year (around 08/03/2023) for a follow-up visit.  Walker Kehr, MD

## 2022-08-02 NOTE — Patient Instructions (Signed)
Look at Vit D3, K2, MK7 vitamins

## 2022-08-03 DIAGNOSIS — M5136 Other intervertebral disc degeneration, lumbar region: Secondary | ICD-10-CM | POA: Diagnosis not present

## 2022-08-03 DIAGNOSIS — M5416 Radiculopathy, lumbar region: Secondary | ICD-10-CM | POA: Diagnosis not present

## 2022-08-03 DIAGNOSIS — M6281 Muscle weakness (generalized): Secondary | ICD-10-CM | POA: Diagnosis not present

## 2022-08-03 DIAGNOSIS — M9903 Segmental and somatic dysfunction of lumbar region: Secondary | ICD-10-CM | POA: Diagnosis not present

## 2022-08-10 DIAGNOSIS — M6281 Muscle weakness (generalized): Secondary | ICD-10-CM | POA: Diagnosis not present

## 2022-08-10 DIAGNOSIS — M5416 Radiculopathy, lumbar region: Secondary | ICD-10-CM | POA: Diagnosis not present

## 2022-08-13 DIAGNOSIS — M5416 Radiculopathy, lumbar region: Secondary | ICD-10-CM | POA: Diagnosis not present

## 2022-08-13 DIAGNOSIS — M6281 Muscle weakness (generalized): Secondary | ICD-10-CM | POA: Diagnosis not present

## 2022-08-16 DIAGNOSIS — M5416 Radiculopathy, lumbar region: Secondary | ICD-10-CM | POA: Diagnosis not present

## 2022-08-16 DIAGNOSIS — M6281 Muscle weakness (generalized): Secondary | ICD-10-CM | POA: Diagnosis not present

## 2022-08-20 DIAGNOSIS — M76821 Posterior tibial tendinitis, right leg: Secondary | ICD-10-CM | POA: Diagnosis not present

## 2022-08-20 DIAGNOSIS — M6281 Muscle weakness (generalized): Secondary | ICD-10-CM | POA: Diagnosis not present

## 2022-08-20 DIAGNOSIS — R269 Unspecified abnormalities of gait and mobility: Secondary | ICD-10-CM | POA: Diagnosis not present

## 2022-08-20 DIAGNOSIS — S86011D Strain of right Achilles tendon, subsequent encounter: Secondary | ICD-10-CM | POA: Diagnosis not present

## 2022-08-23 DIAGNOSIS — M76821 Posterior tibial tendinitis, right leg: Secondary | ICD-10-CM | POA: Diagnosis not present

## 2022-08-23 DIAGNOSIS — M5136 Other intervertebral disc degeneration, lumbar region: Secondary | ICD-10-CM | POA: Diagnosis not present

## 2022-08-23 DIAGNOSIS — M9903 Segmental and somatic dysfunction of lumbar region: Secondary | ICD-10-CM | POA: Diagnosis not present

## 2022-08-23 DIAGNOSIS — M6281 Muscle weakness (generalized): Secondary | ICD-10-CM | POA: Diagnosis not present

## 2022-08-23 DIAGNOSIS — R269 Unspecified abnormalities of gait and mobility: Secondary | ICD-10-CM | POA: Diagnosis not present

## 2022-08-23 DIAGNOSIS — S86011D Strain of right Achilles tendon, subsequent encounter: Secondary | ICD-10-CM | POA: Diagnosis not present

## 2022-08-25 DIAGNOSIS — S86011D Strain of right Achilles tendon, subsequent encounter: Secondary | ICD-10-CM | POA: Diagnosis not present

## 2022-08-25 DIAGNOSIS — M6281 Muscle weakness (generalized): Secondary | ICD-10-CM | POA: Diagnosis not present

## 2022-08-25 DIAGNOSIS — M76821 Posterior tibial tendinitis, right leg: Secondary | ICD-10-CM | POA: Diagnosis not present

## 2022-08-25 DIAGNOSIS — R269 Unspecified abnormalities of gait and mobility: Secondary | ICD-10-CM | POA: Diagnosis not present

## 2022-08-30 DIAGNOSIS — M6281 Muscle weakness (generalized): Secondary | ICD-10-CM | POA: Diagnosis not present

## 2022-08-30 DIAGNOSIS — M5416 Radiculopathy, lumbar region: Secondary | ICD-10-CM | POA: Diagnosis not present

## 2022-09-01 DIAGNOSIS — M76821 Posterior tibial tendinitis, right leg: Secondary | ICD-10-CM | POA: Diagnosis not present

## 2022-09-01 DIAGNOSIS — R269 Unspecified abnormalities of gait and mobility: Secondary | ICD-10-CM | POA: Diagnosis not present

## 2022-09-01 DIAGNOSIS — S86011D Strain of right Achilles tendon, subsequent encounter: Secondary | ICD-10-CM | POA: Diagnosis not present

## 2022-09-01 DIAGNOSIS — M6281 Muscle weakness (generalized): Secondary | ICD-10-CM | POA: Diagnosis not present

## 2022-09-15 ENCOUNTER — Other Ambulatory Visit: Payer: Self-pay

## 2022-09-15 DIAGNOSIS — Z86010 Personal history of colonic polyps: Secondary | ICD-10-CM

## 2022-09-15 MED ORDER — NA SULFATE-K SULFATE-MG SULF 17.5-3.13-1.6 GM/177ML PO SOLN: 1.0000 | Freq: Once | ORAL | 0 refills | Status: AC

## 2022-09-15 NOTE — Telephone Encounter (Signed)
Colon scheduled for 10/13/22 at 10 am at [facility] with GM   Pt has been advised and all information has been sent to the pt in the mail and My Chart

## 2022-09-15 NOTE — Telephone Encounter (Signed)
Inbound call from patient wanting to schedule for hospital procedure.  Please advise. 

## 2022-09-16 DIAGNOSIS — M6281 Muscle weakness (generalized): Secondary | ICD-10-CM | POA: Diagnosis not present

## 2022-09-16 DIAGNOSIS — S86011D Strain of right Achilles tendon, subsequent encounter: Secondary | ICD-10-CM | POA: Diagnosis not present

## 2022-09-16 DIAGNOSIS — M76821 Posterior tibial tendinitis, right leg: Secondary | ICD-10-CM | POA: Diagnosis not present

## 2022-09-16 DIAGNOSIS — R269 Unspecified abnormalities of gait and mobility: Secondary | ICD-10-CM | POA: Diagnosis not present

## 2022-09-20 DIAGNOSIS — R269 Unspecified abnormalities of gait and mobility: Secondary | ICD-10-CM | POA: Diagnosis not present

## 2022-09-20 DIAGNOSIS — S86011D Strain of right Achilles tendon, subsequent encounter: Secondary | ICD-10-CM | POA: Diagnosis not present

## 2022-09-20 DIAGNOSIS — M6281 Muscle weakness (generalized): Secondary | ICD-10-CM | POA: Diagnosis not present

## 2022-09-20 DIAGNOSIS — M76821 Posterior tibial tendinitis, right leg: Secondary | ICD-10-CM | POA: Diagnosis not present

## 2022-09-23 ENCOUNTER — Encounter: Payer: Self-pay | Admitting: Internal Medicine

## 2022-09-23 ENCOUNTER — Ambulatory Visit (INDEPENDENT_AMBULATORY_CARE_PROVIDER_SITE_OTHER): Payer: Medicare Other | Admitting: Internal Medicine

## 2022-09-23 ENCOUNTER — Ambulatory Visit (INDEPENDENT_AMBULATORY_CARE_PROVIDER_SITE_OTHER): Payer: Medicare Other

## 2022-09-23 VITALS — BP 122/80 | HR 60 | Temp 97.9°F | Ht 63.5 in | Wt 134.0 lb

## 2022-09-23 DIAGNOSIS — I499 Cardiac arrhythmia, unspecified: Secondary | ICD-10-CM | POA: Insufficient documentation

## 2022-09-23 DIAGNOSIS — R0609 Other forms of dyspnea: Secondary | ICD-10-CM

## 2022-09-23 DIAGNOSIS — R0602 Shortness of breath: Secondary | ICD-10-CM | POA: Diagnosis not present

## 2022-09-23 NOTE — Assessment & Plan Note (Signed)
?  extrasystole EKG is nl

## 2022-09-23 NOTE — Progress Notes (Signed)
Subjective:  Patient ID: Sue [name], female    DOB: 12-08-1950  Age: 72 y.o. MRN: [id number]  CC: Follow-up   HPI Sue [name] presents for URI/bronchitis sx's x 1 month, mild cough - resolved. C/o fatigue, SOB, palpitations - 80% better now. HR was ok. No CP, no LOC. Little exercise x 3 months due to LBP    Outpatient Medications Prior to Visit  Medication Sig Dispense Refill   ALPRAZolam (XANAX) 0.25 MG tablet TAKE 1 TABLET BY MOUTH THREE TIMES A DAY AS NEEDED FOR ANXIETY 90 tablet 1   baclofen (LIORESAL) 10 MG tablet Take 10 mg by mouth 3 (three) times daily.     clobetasol ointment (TEMOVATE) 3.76 % Apply 1 application. topically as needed.     diazepam (VALIUM) 5 MG tablet Take 5 mg by mouth every 6 (six) hours as needed.     diclofenac (VOLTAREN) 75 MG EC tablet TAKE 1 TABLET BY MOUTH EVERY DAY prn 30 tablet 1   Esterified Estrogens 0.3 MG tablet Take 0.5 tablets (0.15 mg total) by mouth daily. 45 tablet 4   ESTRACE VAGINAL 0.1 MG/GM vaginal cream Place 2.83 Applicatorfuls vaginally once a week. 42.5 g 2   FINACEA 15 % gel APPLY TO AFFECTED AREA TWICE A DAY     meloxicam (MOBIC) 7.5 MG tablet Take 7.5 mg by mouth 2 (two) times daily as needed.     methylPREDNISolone (MEDROL DOSEPAK) 4 MG TBPK tablet Take 4 mg by mouth daily.     metoprolol succinate (TOPROL-XL) 25 MG 24 hr tablet Take 1 tablet (25 mg total) by mouth in the morning and at bedtime. 180 tablet 3   Multiple Vitamin (MULTIVITAMIN) capsule Take 1 capsule by mouth daily.     Na Sulfate-K Sulfate-Mg Sulf 17.5-3.13-1.6 GM/177ML SOLN See admin instructions.     nitroGLYCERIN (NITRODUR - DOSED IN MG/24 HR) 0.2 mg/hr patch Place 0.2 mg onto the skin daily.     polycarbophil (FIBERCON) 625 MG tablet Take 1,250 mg by mouth daily.     potassium chloride (KLOR-CON) 8 MEQ tablet Take 1 tablet (8 mEq total) by mouth daily. 90 tablet 3   traMADol (ULTRAM) 50 MG tablet Take 50 mg by mouth 2 (two) times daily.      triamterene-hydrochlorothiazide (MAXZIDE-25) 37.5-25 MG tablet Take 1 tablet by mouth daily. 90 tablet 3   XIIDRA 5 % SOLN Place 1 drop into both eyes 2 (two) times daily.  2   zolpidem (AMBIEN) 5 MG tablet TAKE 1 TABLET BY MOUTH EVERY DAY AT BEDTIME AS NEEDED FOR SLEEP 90 tablet 1   No facility-administered medications prior to visit.    ROS: Review of Systems  Constitutional:  Positive for fatigue. Negative for activity change, appetite change, chills and unexpected weight change.  HENT:  Negative for congestion, mouth sores and sinus pressure.   Eyes:  Negative for visual disturbance.  Respiratory:  Positive for shortness of breath. Negative for cough, chest tightness and wheezing.   Gastrointestinal:  Negative for abdominal pain and nausea.  Genitourinary:  Negative for difficulty urinating, frequency and vaginal pain.  Musculoskeletal:  Negative for back pain and gait problem.  Skin:  Negative for pallor and rash.  Neurological:  Negative for dizziness, tremors, weakness, numbness and headaches.  Psychiatric/Behavioral:  Negative for confusion, dysphoric mood and sleep disturbance.     Objective:  BP 122/80 (BP Location: Right Arm, Patient Position: Sitting, Cuff Size: Normal)   Pulse 60   Temp 97.9  F (36.6 C) (Oral)   Ht 5' 3.5" (1.613 m)   Wt 134 lb (60.8 kg)   LMP  (LMP Unknown)   SpO2 98%   BMI 23.36 kg/m   BP Readings from Last 3 Encounters:  09/23/22 122/80  08/02/22 (!) 144/82  01/26/22 104/70    Wt Readings from Last 3 Encounters:  09/23/22 134 lb (60.8 kg)  08/02/22 121 lb (54.9 kg)  01/26/22 134 lb (60.8 kg)    Physical Exam Constitutional:      General: She is not in acute distress.    Appearance: Normal appearance. She is well-developed.  HENT:     Head: Normocephalic.     Right Ear: External ear normal.     Left Ear: External ear normal.     Nose: Nose normal.  Eyes:     General:        Right eye: No discharge.        Left eye: No discharge.      Conjunctiva/sclera: Conjunctivae normal.     Pupils: Pupils are equal, round, and reactive to light.  Neck:     Thyroid: No thyromegaly.     Vascular: No JVD.     Trachea: No tracheal deviation.  Cardiovascular:     Rate and Rhythm: Normal rate and regular rhythm.     Heart sounds: Normal heart sounds.  Pulmonary:     Effort: No respiratory distress.     Breath sounds: No stridor. No wheezing.  Abdominal:     General: Bowel sounds are normal. There is no distension.     Palpations: Abdomen is soft. There is no mass.     Tenderness: There is no abdominal tenderness. There is no guarding or rebound.  Musculoskeletal:        General: No tenderness.     Cervical back: Normal range of motion and neck supple. No rigidity.  Lymphadenopathy:     Cervical: No cervical adenopathy.  Skin:    Findings: No erythema or rash.  Neurological:     Cranial Nerves: No cranial nerve deficit.     Motor: No abnormal muscle tone.     Coordination: Coordination normal.     Deep Tendon Reflexes: Reflexes normal.  Psychiatric:        Behavior: Behavior normal.        Thought Content: Thought content normal.        Judgment: Judgment normal.      Procedure: EKG Indication: DOE Impression: S brady. HR 57. No acute changes.  Lab Results  Component Value Date   WBC 7.5 07/29/2022   HGB 14.9 07/29/2022   HCT 44.4 07/29/2022   PLT 305.0 07/29/2022   GLUCOSE 95 07/29/2022   CHOL 230 (H) 07/29/2022   TRIG 56.0 07/29/2022   HDL 74.90 07/29/2022   LDLDIRECT 118.2 07/19/2013   LDLCALC 144 (H) 07/29/2022   ALT 33 07/29/2022   AST 26 07/29/2022   NA 138 07/29/2022   K 4.2 07/29/2022   CL 100 07/29/2022   CREATININE 0.74 07/29/2022   BUN 19 07/29/2022   CO2 33 (H) 07/29/2022   TSH 1.43 07/29/2022   INR 1.0 12/19/2020    MM 3D SCREEN BREAST BILATERAL  Result Date: 09/15/2021 CLINICAL DATA:  Screening. EXAM: DIGITAL SCREENING BILATERAL MAMMOGRAM WITH TOMOSYNTHESIS AND CAD TECHNIQUE:  Bilateral screening digital craniocaudal and mediolateral oblique mammograms were obtained. Bilateral screening digital breast tomosynthesis was performed. The images were evaluated with computer-aided detection. COMPARISON:  Previous exam(s). ACR Breast  Density Category d: The breast tissue is extremely dense, which lowers the sensitivity of mammography FINDINGS: There are no findings suspicious for malignancy. IMPRESSION: No mammographic evidence of malignancy. A result letter of this screening mammogram will be mailed directly to the patient. RECOMMENDATION: Screening mammogram in one year. (Code:SM-B-01Y) BI-RADS CATEGORY  1: Negative. Electronically Signed   By: Abelardo Diesel M.D.   On: 09/15/2021 13:19    Assessment & Plan:   Problem List Items Addressed This Visit       Other   Irregular heart beats    ?extrasystole EKG is nl      Relevant Orders   EKG 12-Lead   ECHOCARDIOGRAM COMPLETE   Ambulatory referral to Cardiology   DOE (dyspnea on exertion) - Primary    Post-URI and deconditioning vs other Will get a CXR, cardiac ECHO Labs are up to date Cardiol ref      Relevant Orders   DG Chest 2 View   ECHOCARDIOGRAM COMPLETE   Ambulatory referral to Cardiology      No orders of the defined types were placed in this encounter.     Follow-up: Return in about 6 weeks (around 11/04/2022) for a follow-up visit.  Walker Kehr, MD

## 2022-09-23 NOTE — Assessment & Plan Note (Signed)
Post-URI and deconditioning vs other Will get a CXR, cardiac ECHO Labs are up to date Cardiol ref

## 2022-09-28 DIAGNOSIS — R269 Unspecified abnormalities of gait and mobility: Secondary | ICD-10-CM | POA: Diagnosis not present

## 2022-09-28 DIAGNOSIS — M76821 Posterior tibial tendinitis, right leg: Secondary | ICD-10-CM | POA: Diagnosis not present

## 2022-09-28 DIAGNOSIS — S86011D Strain of right Achilles tendon, subsequent encounter: Secondary | ICD-10-CM | POA: Diagnosis not present

## 2022-09-28 DIAGNOSIS — M6281 Muscle weakness (generalized): Secondary | ICD-10-CM | POA: Diagnosis not present

## 2022-09-30 DIAGNOSIS — M76821 Posterior tibial tendinitis, right leg: Secondary | ICD-10-CM | POA: Diagnosis not present

## 2022-09-30 DIAGNOSIS — M6281 Muscle weakness (generalized): Secondary | ICD-10-CM | POA: Diagnosis not present

## 2022-09-30 DIAGNOSIS — R269 Unspecified abnormalities of gait and mobility: Secondary | ICD-10-CM | POA: Diagnosis not present

## 2022-09-30 DIAGNOSIS — S86011D Strain of right Achilles tendon, subsequent encounter: Secondary | ICD-10-CM | POA: Diagnosis not present

## 2022-10-04 DIAGNOSIS — M76821 Posterior tibial tendinitis, right leg: Secondary | ICD-10-CM | POA: Diagnosis not present

## 2022-10-04 DIAGNOSIS — S86011D Strain of right Achilles tendon, subsequent encounter: Secondary | ICD-10-CM | POA: Diagnosis not present

## 2022-10-04 DIAGNOSIS — R269 Unspecified abnormalities of gait and mobility: Secondary | ICD-10-CM | POA: Diagnosis not present

## 2022-10-04 DIAGNOSIS — M6281 Muscle weakness (generalized): Secondary | ICD-10-CM | POA: Diagnosis not present

## 2022-10-06 ENCOUNTER — Encounter (HOSPITAL_COMMUNITY): Payer: Self-pay | Admitting: Gastroenterology

## 2022-10-06 DIAGNOSIS — R269 Unspecified abnormalities of gait and mobility: Secondary | ICD-10-CM | POA: Diagnosis not present

## 2022-10-06 DIAGNOSIS — S86011D Strain of right Achilles tendon, subsequent encounter: Secondary | ICD-10-CM | POA: Diagnosis not present

## 2022-10-06 DIAGNOSIS — M76821 Posterior tibial tendinitis, right leg: Secondary | ICD-10-CM | POA: Diagnosis not present

## 2022-10-06 DIAGNOSIS — M6281 Muscle weakness (generalized): Secondary | ICD-10-CM | POA: Diagnosis not present

## 2022-10-13 ENCOUNTER — Encounter (HOSPITAL_COMMUNITY)
Admission: RE | Disposition: A | Discharge status: Home / Self Care | Payer: Self-pay | Source: Home / Self Care | Attending: Gastroenterology

## 2022-10-13 ENCOUNTER — Other Ambulatory Visit: Payer: Self-pay

## 2022-10-13 ENCOUNTER — Ambulatory Visit (HOSPITAL_COMMUNITY)
Admission: RE | Admit: 2022-10-13 | Discharge: 2022-10-13 | Disposition: A | Discharge status: Home / Self Care | Payer: Medicare Other | Attending: Gastroenterology | Admitting: Gastroenterology

## 2022-10-13 ENCOUNTER — Ambulatory Visit (HOSPITAL_BASED_OUTPATIENT_CLINIC_OR_DEPARTMENT_OTHER): Payer: Medicare Other | Admitting: Anesthesiology

## 2022-10-13 ENCOUNTER — Encounter (HOSPITAL_COMMUNITY): Payer: Self-pay | Admitting: Gastroenterology

## 2022-10-13 ENCOUNTER — Ambulatory Visit (HOSPITAL_COMMUNITY): Payer: Medicare Other | Admitting: Anesthesiology

## 2022-10-13 DIAGNOSIS — K5730 Diverticulosis of large intestine without perforation or abscess without bleeding: Secondary | ICD-10-CM | POA: Insufficient documentation

## 2022-10-13 DIAGNOSIS — D126 Benign neoplasm of colon, unspecified: Secondary | ICD-10-CM | POA: Diagnosis not present

## 2022-10-13 DIAGNOSIS — K641 Second degree hemorrhoids: Secondary | ICD-10-CM | POA: Diagnosis not present

## 2022-10-13 DIAGNOSIS — D124 Benign neoplasm of descending colon: Secondary | ICD-10-CM | POA: Diagnosis not present

## 2022-10-13 DIAGNOSIS — Q438 Other specified congenital malformations of intestine: Secondary | ICD-10-CM | POA: Insufficient documentation

## 2022-10-13 DIAGNOSIS — D120 Benign neoplasm of cecum: Secondary | ICD-10-CM | POA: Diagnosis not present

## 2022-10-13 DIAGNOSIS — D123 Benign neoplasm of transverse colon: Secondary | ICD-10-CM

## 2022-10-13 DIAGNOSIS — K644 Residual hemorrhoidal skin tags: Secondary | ICD-10-CM

## 2022-10-13 DIAGNOSIS — Z09 Encounter for follow-up examination after completed treatment for conditions other than malignant neoplasm: Secondary | ICD-10-CM | POA: Diagnosis not present

## 2022-10-13 DIAGNOSIS — I10 Essential (primary) hypertension: Secondary | ICD-10-CM | POA: Diagnosis not present

## 2022-10-13 DIAGNOSIS — Z980 Intestinal bypass and anastomosis status: Secondary | ICD-10-CM | POA: Diagnosis not present

## 2022-10-13 DIAGNOSIS — D122 Benign neoplasm of ascending colon: Secondary | ICD-10-CM | POA: Diagnosis not present

## 2022-10-13 DIAGNOSIS — K635 Polyp of colon: Secondary | ICD-10-CM | POA: Diagnosis not present

## 2022-10-13 DIAGNOSIS — Z86010 Personal history of colonic polyps: Secondary | ICD-10-CM

## 2022-10-13 DIAGNOSIS — D125 Benign neoplasm of sigmoid colon: Secondary | ICD-10-CM

## 2022-10-13 DIAGNOSIS — I2510 Atherosclerotic heart disease of native coronary artery without angina pectoris: Secondary | ICD-10-CM | POA: Insufficient documentation

## 2022-10-13 DIAGNOSIS — F419 Anxiety disorder, unspecified: Secondary | ICD-10-CM | POA: Diagnosis not present

## 2022-10-13 HISTORY — PX: POLYPECTOMY: SHX5525

## 2022-10-13 HISTORY — PX: COLONOSCOPY WITH PROPOFOL: SHX5780

## 2022-10-13 SURGERY — COLONOSCOPY WITH PROPOFOL
Anesthesia: Monitor Anesthesia Care

## 2022-10-13 MED ORDER — PROPOFOL 500 MG/50ML IV EMUL
INTRAVENOUS | Status: DC | PRN
  Administered 2022-10-13: 100 ug/kg/min via INTRAVENOUS

## 2022-10-13 MED ORDER — PROPOFOL 10 MG/ML IV BOLUS
INTRAVENOUS | Status: DC | PRN
  Administered 2022-10-13: 30 mg via INTRAVENOUS
  Administered 2022-10-13: 20 mg via INTRAVENOUS
  Administered 2022-10-13: 50 mg via INTRAVENOUS

## 2022-10-13 MED ORDER — SODIUM CHLORIDE 0.9 % IV SOLN: INTRAVENOUS | Status: DC

## 2022-10-13 MED ORDER — PROPOFOL 1000 MG/100ML IV EMUL
INTRAVENOUS | Status: AC
  Filled 2022-10-13: qty 100

## 2022-10-13 MED ORDER — LACTATED RINGERS IV SOLN
INTRAVENOUS | Status: AC | PRN
  Administered 2022-10-13: 1000 mL via INTRAVENOUS

## 2022-10-13 SURGICAL SUPPLY — 22 items

## 2022-10-13 NOTE — Anesthesia Preprocedure Evaluation (Addendum)
Anesthesia Evaluation  Patient identified by MRN, date of birth, ID band Patient awake    Reviewed: Allergy & Precautions, NPO status , Patient's Chart, lab work & pertinent test results  Airway Mallampati: II  TM Distance: >3 FB     Dental  (+) Teeth Intact, Dental Advisory Given   Pulmonary neg pulmonary ROS   breath sounds clear to auscultation       Cardiovascular hypertension, + CAD and + DOE  + Valvular Problems/Murmurs  Rhythm:Regular Rate:Normal     Neuro/Psych   Anxiety     negative neurological ROS     GI/Hepatic Neg liver ROS,,,History noted CG   Endo/Other  negative endocrine ROS    Renal/GU negative Renal ROS     Musculoskeletal  (+) Arthritis ,    Abdominal   Peds  Hematology   Anesthesia Other Findings   Reproductive/Obstetrics                             Anesthesia Physical Anesthesia Plan  ASA: III  Anesthesia Plan: MAC   Post-op Pain Management:    Induction:   PONV Risk Score and Plan: 2 and Propofol infusion  Airway Management Planned: Nasal Cannula and Simple Face Mask  Additional Equipment:   Intra-op Plan:   Post-operative Plan:   Informed Consent: I have reviewed the patients History and Physical, chart, labs and discussed the procedure including the risks, benefits and alternatives for the proposed anesthesia with the patient or authorized representative who has indicated his/her understanding and acceptance.     Dental advisory given  Plan Discussed with: CRNA and Anesthesiologist  Anesthesia Plan Comments:         Anesthesia Quick Evaluation

## 2022-10-13 NOTE — Transfer of Care (Signed)
Immediate Anesthesia Transfer of Care Note  Patient: DYNASTY HOLQUIN  Procedure(s) Performed: Procedure(s): COLONOSCOPY WITH PROPOFOL (N/A) POLYPECTOMY  Patient Location: PACU  Anesthesia Type:MAC  Level of Consciousness: Patient easily awoken, sedated, comfortable, cooperative, following commands, responds to stimulation.   Airway & Oxygen Therapy: Patient spontaneously breathing, ventilating well, oxygen via simple oxygen mask.  Post-op Assessment: Report given to PACU RN, vital signs reviewed and stable, moving all extremities.   Post vital signs: Reviewed and stable.  Complications: No apparent anesthesia complications  Last Vitals:  Vitals Value Taken Time  BP 155/62 10/13/22 1000  Temp    Pulse 56 10/13/22 1001  Resp 15 10/13/22 1001  SpO2 100 % 10/13/22 1001  Vitals shown include unvalidated device data.  Last Pain:  Vitals:   10/13/22 1000  TempSrc:   PainSc: Asleep         Complications: No notable events documented.

## 2022-10-13 NOTE — Discharge Instructions (Signed)
YOU HAD AN ENDOSCOPIC PROCEDURE TODAY: Refer to the procedure report and other information in the discharge instructions given to you for any specific questions about what was found during the examination. If this information does not answer your questions, please call [place] office at [phone] to clarify.  ° °YOU SHOULD EXPECT: Some feelings of bloating in the abdomen. Passage of more gas than usual. Walking can help get rid of the air that was put into your GI tract during the procedure and reduce the bloating. If you had a lower endoscopy (such as a colonoscopy or flexible sigmoidoscopy) you may notice spotting of blood in your stool or on the toilet paper. Some abdominal soreness may be present for a day or two, also. ° °DIET: Your first meal following the procedure should be a light meal and then it is ok to progress to your normal diet. A half-sandwich or bowl of soup is an example of a good first meal. Heavy or fried foods are harder to digest and may make you feel nauseous or bloated. Drink plenty of fluids but you should avoid alcoholic beverages for 24 hours.  ° °ACTIVITY: Your care partner should take you home directly after the procedure. You should plan to take it easy, moving slowly for the rest of the day. You can resume normal activity the day after the procedure however YOU SHOULD NOT DRIVE, use power tools, machinery or perform tasks that involve climbing or major physical exertion for 24 hours (because of the sedation medicines used during the test).  ° °SYMPTOMS TO REPORT IMMEDIATELY: °A gastroenterologist can be reached at any hour. Please call [phone]  for any of the following symptoms:  °Following lower endoscopy (colonoscopy, flexible sigmoidoscopy) °Excessive amounts of blood in the stool  °Significant tenderness, worsening of abdominal pains  °Swelling of the abdomen that is new, acute  °Fever of 100° or higher  ° ° °FOLLOW UP:  °If any biopsies were taken you will be contacted by  phone or by letter within the next 1-3 weeks. Call [phone]  if you have not heard about the biopsies in 3 weeks.  °Please also call with any specific questions about appointments or follow up tests.  °

## 2022-10-13 NOTE — Anesthesia Postprocedure Evaluation (Signed)
Anesthesia Post Note  Patient: KARYN BRULL  Procedure(s) Performed: COLONOSCOPY WITH PROPOFOL POLYPECTOMY     Patient location during evaluation: PACU Anesthesia Type: MAC Level of consciousness: awake and alert Pain management: pain level controlled Vital Signs Assessment: post-procedure vital signs reviewed and stable Respiratory status: spontaneous breathing, nonlabored ventilation, respiratory function stable and patient connected to nasal cannula oxygen Cardiovascular status: stable and blood pressure returned to baseline Postop Assessment: no apparent nausea or vomiting Anesthetic complications: no   No notable events documented.  Last Vitals:  Vitals:   10/13/22 1016 10/13/22 1021  BP:  (!) 202/89  Pulse: 66 61  Resp: 19 18  Temp:    SpO2: 100% 100%    Last Pain:  Vitals:   10/13/22 1021  TempSrc:   PainSc: 0-No pain                 Marylan Glore

## 2022-10-13 NOTE — Anesthesia Procedure Notes (Signed)
Procedure Name: MAC Date/Time: 10/13/2022 9:30 AM  Performed by: Deliah Boston, CRNAPre-anesthesia Checklist: Patient identified, Emergency Drugs available, Suction available and Patient being monitored Patient Re-evaluated:Patient Re-evaluated prior to induction Oxygen Delivery Method: Simple face mask Preoxygenation: Pre-oxygenation with 100% oxygen Placement Confirmation: positive ETCO2 and breath sounds checked- equal and bilateral Dental Injury: Teeth and Oropharynx as per pre-operative assessment

## 2022-10-13 NOTE — Op Note (Signed)
[facility] Patient Name: Sue [name] Procedure Date: 10/13/2022 MRN: [id number] Attending MD: Justice Britain , MD, [id number] Date of Birth: [birth year] CSN: [id number] Age: 72 Admit Type: Outpatient Procedure:                Colonoscopy Indications:              Surveillance: Piecemeal removal of large sessile                            adenoma last colonoscopy (< 3 yrs) Providers:                Justice Britain, MD, Dulcy Fanny, Frazier Richards, Technician Referring MD:             Mauri Pole, MD, Evie Lacks Plotnikov MD, MD Medicines:                Monitored Anesthesia Care Complications:            No immediate complications. Estimated Blood Loss:     Estimated blood loss was minimal. Procedure:                Pre-Anesthesia Assessment:                           - Prior to the procedure, a History and Physical                            was performed, and patient medications and                            allergies were reviewed. The patient's tolerance of                            previous anesthesia was also reviewed. The risks                            and benefits of the procedure and the sedation                            options and risks were discussed with the patient.                            All questions were answered, and informed consent                            was obtained. Prior Anticoagulants: The patient has                            taken no anticoagulant or antiplatelet agents                            except for NSAID medication. ASA Grade Assessment:  III - A patient with severe systemic disease. After                            reviewing the risks and benefits, the patient was                            deemed in satisfactory condition to undergo the                            procedure.                           After obtaining informed consent, the  colonoscope                            was passed under direct vision. Throughout the                            procedure, the patient's blood pressure, pulse, and                            oxygen saturations were monitored continuously. The                            CF-HQ190L ([id number]) Olympus colonoscope was                            introduced through the anus and advanced to the 3                            cm into the ileum. The colonoscopy was performed                            without difficulty. The patient tolerated the                            procedure. The quality of the bowel preparation was                            adequate. The terminal ileum, ileocecal valve,                            appendiceal orifice, and rectum were photographed. Scope In: 9:34:15 AM Scope Out: 9:53:26 AM Scope Withdrawal Time: 0 hours 14 minutes 17 seconds  Total Procedure Duration: 0 hours 19 minutes 11 seconds  Findings:      Skin tags were found on perianal exam.      The digital rectal exam findings include hemorrhoids. Pertinent       negatives include no palpable rectal lesions.      The left colon was moderately tortuous.      The terminal ileum and ileocecal valve appeared normal.      Eight sessile polyps were found in the sigmoid colon (1), descending       colon (1), transverse colon (1), ascending colon (3) and cecum (2).  The       polyps were 1 to 3 mm in size. These polyps were removed with a cold       snare. Resection and retrieval were complete.      A few small-mouthed diverticula were found in the sigmoid colon.      There was evidence of a prior end-to-side colo-colonic anastomosis in       the proximal rectum (at 10 cm). This was patent and was characterized by       healthy appearing mucosa. The anastomosis was traversed.      Normal mucosa was found in the entire colon otherwise.      Non-bleeding non-thrombosed external and internal hemorrhoids were found        during retroflexion, during perianal exam and during digital exam. The       hemorrhoids were Grade II (internal hemorrhoids that prolapse but reduce       spontaneously). Impression:               - Perianal skin tags found on perianal exam.                            Hemorrhoids found on digital rectal exam.                           - Tortuous left colon.                           - The examined portion of the ileum was normal.                           - Eight, 1 to 3 mm polyps in the sigmoid colon, in                            the descending colon, in the transverse colon, in                            the ascending colon and in the cecum, removed with                            a cold snare. Resected and retrieved.                           - Diverticulosis in the sigmoid colon.                           - Patent end-to-side colo-colonic anastomosis                            within the rectum (at 10 cm), characterized by                            healthy appearing mucosa.                           - Normal mucosa in the entire examined colon  otherwise.                           - Non-bleeding non-thrombosed external and internal                            hemorrhoids. Moderate Sedation:      Not Applicable - Patient had care per Anesthesia. Recommendation:           - The patient will be observed post-procedure,                            until all discharge criteria are met.                           - Discharge patient to home.                           - Patient has a contact number available for                            emergencies. The signs and symptoms of potential                            delayed complications were discussed with the                            patient. Return to normal activities tomorrow.                            Written discharge instructions were provided to the                            patient.                            - High fiber diet.                           - Use FiberCon 1-2 tablets PO daily.                           - Continue present medications.                           - Await pathology results.                           - Repeat colonoscopy in 3 years for surveillance.                           - The findings and recommendations were discussed                            with the patient.                           -  The findings and recommendations were discussed                            with the designated responsible adult. Procedure Code(s):        --- Professional ---                           [phone], Colonoscopy, flexible; with removal of                            tumor(s), polyp(s), or other lesion(s) by snare                            technique Diagnosis Code(s):        --- Professional ---                           Z86.010, Personal history of colonic polyps                           K64.1, Second degree hemorrhoids                           D12.5, Benign neoplasm of sigmoid colon                           D12.4, Benign neoplasm of descending colon                           D12.3, Benign neoplasm of transverse colon (hepatic                            flexure or splenic flexure)                           D12.2, Benign neoplasm of ascending colon                           D12.0, Benign neoplasm of cecum                           Z98.0, Intestinal bypass and anastomosis status                           K64.4, Residual hemorrhoidal skin tags                           K57.30, Diverticulosis of large intestine without                            perforation or abscess without bleeding                           Q43.8, Other specified congenital malformations of                            intestine CPT copyright 2022 [employer]. All rights reserved. The codes documented in this report are preliminary and upon coder review may  be revised to meet current  compliance requirements. Justice Britain, MD 10/13/2022 10:06:03 AM Number of Addenda: 0

## 2022-10-13 NOTE — H&P (Signed)
GASTROENTEROLOGY PROCEDURE H&P NOTE   Primary Care Physician: Cassandria Anger, MD  HPI: Sue [name] is a 72 y.o. female who presents for Colonoscopy for surveillance and history of previously resected transverse colon SSP in piecemeal in 2022.  Past Medical History:  Diagnosis Date   Anxiety    Cataract    Diverticulitis    FRACTURE, ARM, LEFT ~ [place], FOOT ~ [place], INDEX FINGER, LEFT ~ 1960   Heart murmur    patient denies this dx on 01/28/21   HYPERTENSION 11/15/2007   INSOMNIA, CHRONIC 06/17/2009   MITRAL VALVE PROLAPSE, HX OF    OSTEOARTHRITIS 11/15/2007   Subdural hematoma ([place]) 03/2015   S/P fall   Past Surgical History:  Procedure Laterality Date   CATARACT EXTRACTION W/ INTRAOCULAR LENS IMPLANT Left 01/2015   COLON RESECTION N/A 07/28/2016   Procedure: LAPAROSCOPIC ASSISTED  COLOSTOMY TAKEDOWN WITH COLORECTAL ANASTOMOSIS;  Surgeon: Mickeal Skinner, MD;  Location: [facility];  Service: General;  Laterality: N/A;   COLON SURGERY     COLONOSCOPY     COLONOSCOPY WITH PROPOFOL N/A 01/29/2021   Procedure: COLONOSCOPY WITH PROPOFOL;  Surgeon: Irving Copas., MD;  Location: [facility];  Service: Gastroenterology;  Laterality: N/A;   COLOSTOMY TAKEDOWN  07/28/2016   laparoscopic converted to open take down of end colostomy with colo-rectostomy/notes 07/28/2016   ENDOSCOPIC MUCOSAL RESECTION N/A 01/29/2021   Procedure: ENDOSCOPIC MUCOSAL RESECTION;  Surgeon: Irving Copas., MD;  Location: [facility] ENDOSCOPY;  Service: Gastroenterology;  Laterality: N/A;   EYE SURGERY     HEMOSTASIS CLIP PLACEMENT  01/29/2021   Procedure: HEMOSTASIS CLIP PLACEMENT;  Surgeon: Irving Copas., MD;  Location: [facility];  Service: Gastroenterology;;   PARTIAL COLECTOMY N/A 02/11/2016   Procedure: PARTIAL COLECTOMY with colostomy;  Surgeon: Mickeal Skinner, MD;  Location: [facility];  Service: General;  Laterality: N/A;   POLYPECTOMY  01/29/2021    Procedure: POLYPECTOMY;  Surgeon: Irving Copas., MD;  Location: [facility];  Service: Gastroenterology;;   SUBMUCOSAL LIFTING INJECTION  01/29/2021   Procedure: SUBMUCOSAL LIFTING INJECTION;  Surgeon: Irving Copas., MD;  Location[facility] ENDOSCOPY;  Service: Gastroenterology;;   TONSILLECTOMY     Current Facility-Administered Medications  Medication Dose Route Frequency Provider Last Rate Last Admin   0.9 %  sodium chloride infusion   Intravenous Continuous Mansouraty, Telford Nab., MD        Current Facility-Administered Medications:    0.9 %  sodium chloride infusion, , Intravenous, Continuous, Mansouraty, Telford Nab., MD Allergies  Allergen Reactions   Clindamycin Anaphylaxis   Aspirin     bruising   Fish Oil     bruising   Latex     REACTION: sores in mouth   Penicillins Hives    Has patient had a PCN reaction causing immediate rash, facial/tongue/throat swelling, SOB or lightheadedness with hypotension: Yes Has patient had a PCN reaction causing severe rash involving mucus membranes or skin necrosis: No Has patient had a PCN reaction that required hospitalization No Has patient had a PCN reaction occurring within the last 10 years: No If all of the above answers are "NO", then may proceed with Cephalosporin use.    Family History  Problem Relation Age of Onset   Breast cancer Mother    Hypertension Father    Cancer Father        LUNG   Parkinsonism Other    Colon cancer Neg Hx  Esophageal cancer Neg Hx    Colon polyps Neg Hx    Stomach cancer Neg Hx    Inflammatory bowel disease Neg Hx    Liver disease Neg Hx    Pancreatic cancer Neg Hx    Rectal cancer Neg Hx    Social History   Socioeconomic History   Marital status: Divorced    Spouse name: Not on file   Number of children: 2   Years of education: Not on file   Highest education level: Not on file  Occupational History   Occupation: Home maker  Tobacco Use   Smoking status: Never    Smokeless tobacco: Never  Vaping Use   Vaping Use: Never used  Substance and Sexual Activity   Alcohol use: Yes    Alcohol/week: 1.0 standard drink of alcohol    Types: 1 Glasses of wine per week    Comment: occasional wine   Drug use: No   Sexual activity: Not Currently    Partners: Male    Birth control/protection: Post-menopausal    Comment: older than 70, more than 5  Other Topics Concern   Not on file  Social History Narrative   [place], [place]   Married '72-17 yrs then divorced; remarried '90 - 4 yrs divorced. 1 boy - '75; 1 daughter '83. Single partner since 2001. Socially active, riding horses not hunting but jumping little jumps. Lost her mother Spring '12 - doing OK with loss.   Social Determinants of Health   Financial Resource Strain: Low Risk  (12/11/2021)   Overall Financial Resource Strain (CARDIA)    Difficulty of Paying Living Expenses: Not hard at all  Food Insecurity: No Food Insecurity (12/11/2021)   Hunger Vital Sign    Worried About Running Out of Food in the Last Year: Never true    Ran Out of Food in the Last Year: Never true  Transportation Needs: No Transportation Needs (12/11/2021)   PRAPARE - Hydrologist (Medical): No    Lack of Transportation (Non-Medical): No  Physical Activity: Sufficiently Active (12/11/2021)   Exercise Vital Sign    Days of Exercise per Week: 7 days    Minutes of Exercise per Session: 60 min  Stress: No Stress Concern Present (12/11/2021)   [place]    Feeling of Stress : Not at all  Social Connections: Moderately Integrated (12/11/2021)   Social Connection and Isolation Panel [NHANES]    Frequency of Communication with Friends and Family: Twice a week    Frequency of Social Gatherings with Friends and Family: Twice a week    Attends Religious Services: More than 4 times per year    Active Member of [employer] or Organizations:  Yes    Attends Archivist Meetings: 1 to 4 times per year    Marital Status: Divorced  Intimate Partner Violence: Not At Risk (12/11/2021)   Humiliation, Afraid, Rape, and Kick questionnaire    Fear of Current or Ex-Partner: No    Emotionally Abused: No    Physically Abused: No    Sexually Abused: No    Physical Exam: There were no vitals filed for this visit. There is no height or weight on file to calculate BMI. GEN: NAD EYE: Sclerae anicteric ENT: MMM CV: Non-tachycardic GI: Soft, NT/ND NEURO:  Alert & Oriented x 3  Lab Results: No results for input(s): "WBC", "HGB", "HCT", "PLT" in the last 72 hours. BMET No  results for input(s): "NA", "K", "CL", "CO2", "GLUCOSE", "BUN", "CREATININE", "CALCIUM" in the last 72 hours. LFT No results for input(s): "PROT", "ALBUMIN", "AST", "ALT", "ALKPHOS", "BILITOT", "BILIDIR", "IBILI" in the last 72 hours. PT/INR No results for input(s): "LABPROT", "INR" in the last 72 hours.   Impression / Plan: This is a 72 y.o.female who presents for Colonoscopy for surveillance and history of previously resected transverse colon SSP in piecemeal in 2022.  The risks and benefits of endoscopic evaluation/treatment were discussed with the patient and/or family; these include but are not limited to the risk of perforation, infection, bleeding, missed lesions, lack of diagnosis, severe illness requiring hospitalization, as well as anesthesia and sedation related illnesses.  The patient's history has been reviewed, patient examined, no change in status, and deemed stable for procedure.  The patient and/or family is agreeable to proceed.    Justice Britain, MD [facility] Office # [phone]

## 2022-10-14 ENCOUNTER — Encounter (HOSPITAL_COMMUNITY): Payer: Self-pay | Admitting: Gastroenterology

## 2022-10-14 ENCOUNTER — Encounter: Payer: Self-pay | Admitting: Gastroenterology

## 2022-10-14 LAB — SURGICAL PATHOLOGY

## 2022-10-15 ENCOUNTER — Ambulatory Visit (HOSPITAL_COMMUNITY): Payer: Medicare Other | Attending: Internal Medicine

## 2022-10-15 DIAGNOSIS — I499 Cardiac arrhythmia, unspecified: Secondary | ICD-10-CM | POA: Insufficient documentation

## 2022-10-15 DIAGNOSIS — R0609 Other forms of dyspnea: Secondary | ICD-10-CM | POA: Insufficient documentation

## 2022-10-15 LAB — ECHOCARDIOGRAM COMPLETE
Area-P 1/2: 3.21 cm2
S' Lateral: 2.6 cm

## 2022-10-19 ENCOUNTER — Other Ambulatory Visit: Payer: Self-pay | Admitting: Obstetrics & Gynecology

## 2022-10-19 DIAGNOSIS — Z1231 Encounter for screening mammogram for malignant neoplasm of breast: Secondary | ICD-10-CM

## 2022-10-22 ENCOUNTER — Ambulatory Visit
Admission: RE | Admit: 2022-10-22 | Discharge: 2022-10-22 | Disposition: A | Discharge status: Home / Self Care | Payer: Medicare Other | Source: Ambulatory Visit | Attending: Obstetrics & Gynecology | Admitting: Obstetrics & Gynecology

## 2022-10-22 DIAGNOSIS — Z1231 Encounter for screening mammogram for malignant neoplasm of breast: Secondary | ICD-10-CM

## 2022-11-08 DIAGNOSIS — H40013 Open angle with borderline findings, low risk, bilateral: Secondary | ICD-10-CM | POA: Diagnosis not present

## 2022-11-10 DIAGNOSIS — Z85828 Personal history of other malignant neoplasm of skin: Secondary | ICD-10-CM | POA: Diagnosis not present

## 2022-11-10 DIAGNOSIS — L570 Actinic keratosis: Secondary | ICD-10-CM | POA: Diagnosis not present

## 2022-11-10 DIAGNOSIS — L718 Other rosacea: Secondary | ICD-10-CM | POA: Diagnosis not present

## 2022-12-13 ENCOUNTER — Ambulatory Visit (INDEPENDENT_AMBULATORY_CARE_PROVIDER_SITE_OTHER): Payer: Medicare Other

## 2022-12-13 VITALS — Ht 63.5 in | Wt 131.0 lb

## 2022-12-13 DIAGNOSIS — Z0000 Encounter for general adult medical examination without abnormal findings: Secondary | ICD-10-CM | POA: Diagnosis not present

## 2022-12-13 NOTE — Patient Instructions (Addendum)
Sue [name] , Thank you for taking time to come for your Medicare Wellness Visit. I appreciate your ongoing commitment to your health goals. Please review the following plan we discussed and let me know if I can assist you in the future.   These are the goals we discussed:  Goals      My goal for 2024 is to continue to do the things I love to do.        This is a list of the screening recommended for you and due dates:  Health Maintenance  Topic Date Due   Zoster (Shingles) Vaccine (1 of 2) Never done   Pneumonia Vaccine (1 of 1 - PCV) Never done   Cologuard (Stool DNA test)  08/19/2023   Mammogram  10/23/2023   Medicare Annual Wellness Visit  12/13/2023   DTaP/Tdap/Td vaccine (3 - Td or Tdap) 06/15/2029   DEXA scan (bone density measurement)  Completed   Hepatitis C Screening: USPSTF Recommendation to screen - Ages 63-79 yo.  Completed   HPV Vaccine  Aged Out   Flu Shot  Discontinued   COVID-19 Vaccine  Discontinued    Advanced directives: Yes  Conditions/risks identified: Yes  Next appointment: Follow up in one year for your annual wellness visit.   Preventive Care 75 Years and Older, Female Preventive care refers to lifestyle choices and visits with your health care provider that can promote health and wellness. What does preventive care include? A yearly physical exam. This is also called an annual well check. Dental exams once or twice a year. Routine eye exams. Ask your health care provider how often you should have your eyes checked. Personal lifestyle choices, including: Daily care of your teeth and gums. Regular physical activity. Eating a healthy diet. Avoiding tobacco and drug use. Limiting alcohol use. Practicing safe sex. Taking low-dose aspirin every day. Taking vitamin and mineral supplements as recommended by your health care provider. What happens during an annual well check? The services and screenings done by your health care provider during your  annual well check will depend on your age, overall health, lifestyle risk factors, and family history of disease. Counseling  Your health care provider may ask you questions about your: Alcohol use. Tobacco use. Drug use. Emotional well-being. Home and relationship well-being. Sexual activity. Eating habits. History of falls. Memory and ability to understand (cognition). Work and work Statistician. Reproductive health. Screening  You may have the following tests or measurements: Height, weight, and BMI. Blood pressure. Lipid and cholesterol levels. These may be checked every 5 years, or more frequently if you are over 77 years old. Skin check. Lung cancer screening. You may have this screening every year starting at age 37 if you have a 30-pack-year history of smoking and currently smoke or have quit within the past 15 years. Fecal occult blood test (FOBT) of the stool. You may have this test every year starting at age 74. Flexible sigmoidoscopy or colonoscopy. You may have a sigmoidoscopy every 5 years or a colonoscopy every 10 years starting at age 38. Hepatitis C blood test. Hepatitis B blood test. Sexually transmitted disease (STD) testing. Diabetes screening. This is done by checking your blood sugar (glucose) after you have not eaten for a while (fasting). You may have this done every 1-3 years. Bone density scan. This is done to screen for osteoporosis. You may have this done starting at age 47. Mammogram. This may be done every 1-2 years. Talk to your health care provider  about how often you should have regular mammograms. Talk with your health care provider about your test results, treatment options, and if necessary, the need for more tests. Vaccines  Your health care provider may recommend certain vaccines, such as: Influenza vaccine. This is recommended every year. Tetanus, diphtheria, and acellular pertussis (Tdap, Td) vaccine. You may need a Td booster every 10  years. Zoster vaccine. You may need this after age 7. Pneumococcal 13-valent conjugate (PCV13) vaccine. One dose is recommended after age 14. Pneumococcal polysaccharide (PPSV23) vaccine. One dose is recommended after age 62. Talk to your health care provider about which screenings and vaccines you need and how often you need them. This information is not intended to replace advice given to you by your health care provider. Make sure you discuss any questions you have with your health care provider. Document Released: 09/26/2015 Document Revised: 05/19/2016 Document Reviewed: 07/01/2015 Elsevier Interactive Patient Education  2017 [place] Prevention in the Home Falls can cause injuries. They can happen to people of all ages. There are many things you can do to make your home safe and to help prevent falls. What can I do on the outside of my home? Regularly fix the edges of walkways and driveways and fix any cracks. Remove anything that might make you trip as you walk through a door, such as a raised step or threshold. Trim any bushes or trees on the path to your home. Use bright outdoor lighting. Clear any walking paths of anything that might make someone trip, such as rocks or tools. Regularly check to see if handrails are loose or broken. Make sure that both sides of any steps have handrails. Any raised decks and porches should have guardrails on the edges. Have any leaves, snow, or ice cleared regularly. Use sand or salt on walking paths during winter. Clean up any spills in your garage right away. This includes oil or grease spills. What can I do in the bathroom? Use night lights. Install grab bars by the toilet and in the tub and shower. Do not use towel bars as grab bars. Use non-skid mats or decals in the tub or shower. If you need to sit down in the shower, use a plastic, non-slip stool. Keep the floor dry. Clean up any water that spills on the floor as soon as it  happens. Remove soap buildup in the tub or shower regularly. Attach bath mats securely with double-sided non-slip rug tape. Do not have throw rugs and other things on the floor that can make you trip. What can I do in the bedroom? Use night lights. Make sure that you have a light by your bed that is easy to reach. Do not use any sheets or blankets that are too big for your bed. They should not hang down onto the floor. Have a firm chair that has side arms. You can use this for support while you get dressed. Do not have throw rugs and other things on the floor that can make you trip. What can I do in the kitchen? Clean up any spills right away. Avoid walking on wet floors. Keep items that you use a lot in easy-to-reach places. If you need to reach something above you, use a strong step stool that has a grab bar. Keep electrical cords out of the way. Do not use floor polish or wax that makes floors slippery. If you must use wax, use non-skid floor wax. Do not have throw rugs and  other things on the floor that can make you trip. What can I do with my stairs? Do not leave any items on the stairs. Make sure that there are handrails on both sides of the stairs and use them. Fix handrails that are broken or loose. Make sure that handrails are as long as the stairways. Check any carpeting to make sure that it is firmly attached to the stairs. Fix any carpet that is loose or worn. Avoid having throw rugs at the top or bottom of the stairs. If you do have throw rugs, attach them to the floor with carpet tape. Make sure that you have Schw[place] light switch at the top of the stairs and the bottom of the stairs. If you do not have them, ask someone to add them for you. What else can I do to help prevent falls? Wear shoes that: Do not have high heels. Have rubber bottoms. Are comfortable and fit you well. Are closed at the toe. Do not wear sandals. If you use [place]witz stepladder: Make sure that it is fully opened.  Do not climb [place] closed stepladder. Make sure that both sides of the stepladder are locked into place. Ask someone to hold it for you, if possible. Clearly mark and make sure that you can see: Any grab bars or handrails. First and last steps. Where the edge of each step is. Use tools that help you move around (mobility aids) if they are needed. These include: Canes. Walkers. Scooters. Crutches. Turn on the lights when you go into [place] dark area. Replace any light bulbs as soon as they burn out. Set up your furniture so you have [place] clear path. Avoid moving your furniture around. If any of your floors are uneven, fix them. If there are any pets around you, be aware of where they are. Review your medicines with your doctor. Some medicines can make you feel dizzy. This can increase your chance of falling. Ask your doctor what other things that you can do to help prevent falls. This information is not intended to replace advice given to you by your health care provider. Make sure you discuss any questions you have with your health care provider. Document Released: 06/26/2009 Document Revised: 02/05/2016 Document Reviewed: 10/04/2014 Elsevier Interactive Patient Education  2017 [employer].

## 2022-12-13 NOTE — Progress Notes (Addendum)
I connected with  [name] on 12/13/22 by a audio enabled telemedicine application and verified that I am speaking with the correct person using two identifiers.  Patient Location: Home  Provider Location: Office/Clinic  I discussed the limitations of evaluation and management by telemedicine. The patient expressed understanding and agreed to proceed.  Subjective:   Sue [name] is a 72 y.o. female who presents for Medicare Annual (Subsequent) preventive examination.  Review of Systems     Cardiac Risk Factors include: advanced age (>33men, >57 women);dyslipidemia;family history of premature cardiovascular disease;hypertension     Objective:    Today's Vitals   12/13/22 0850  Weight: 131 lb (59.4 kg)  Height: 5' 3.5" (1.613 m)  PainSc: 0-No pain   Body mass index is 22.84 kg/m.     12/13/2022    8:52 AM 10/13/2022    9:06 AM 12/11/2021    2:08 PM 01/29/2021    9:15 AM 11/19/2020    3:18 PM 07/31/2019    3:43 PM 07/28/2016    6:57 PM  Advanced Directives  Does Patient Have a Medical Advance Directive? Yes No Yes Yes Yes Yes Yes  Type of Paramedic of [place];Living will  [place];Living will Healthcare Power of Attorney Living will;Healthcare Power of Attorney Living will;Healthcare Power of [place];Living will  Does patient want to make changes to medical advance directive?     No - Patient declined No - Patient declined No - Patient declined  Copy of [place] in Chart? No - copy requested  No - copy requested Yes - validated most recent copy scanned in chart (See row information) No - copy requested  No - copy requested  Would patient like information on creating a medical advance directive?  No - Patient declined         Current Medications (verified) Outpatient Encounter Medications as of 12/13/2022  Medication Sig   ALPRAZolam (XANAX) 0.25 MG tablet TAKE 1  TABLET BY MOUTH THREE TIMES A DAY AS NEEDED FOR ANXIETY   clobetasol ointment (TEMOVATE) [id number] % Apply 1 application  topically daily as needed (Rash).   diclofenac (VOLTAREN) 75 MG EC tablet TAKE 1 TABLET BY MOUTH EVERY DAY prn   Esterified Estrogens 0.3 MG tablet Take 0.5 tablets (0.15 mg total) by mouth daily. (Patient taking differently: Take 0.15 mg by mouth every other day.)   ESTRACE VAGINAL 0.1 MG/GM vaginal cream Place [id number] Applicatorfuls vaginally once a week. (Patient taking differently: Place [id number] Applicatorfuls vaginally every 30 (thirty) days.)   FINACEA 15 % gel Apply 1 application  topically daily as needed (Rosacea).   meloxicam (MOBIC) 7.5 MG tablet Take 7.5 mg by mouth 2 (two) times daily as needed for pain.   metoprolol succinate (TOPROL-XL) 25 MG 24 hr tablet Take 1 tablet (25 mg total) by mouth in the morning and at bedtime. (Patient taking differently: Take 25 mg by mouth every morning. Additional 25 mg if needed at bedtime)   Multiple Vitamin (MULTIVITAMIN) capsule Take 1 capsule by mouth daily.   Multiple Vitamins-Minerals (HAIR SKIN & NAILS) TABS Take 1 tablet by mouth daily. GNC   OVER THE COUNTER MEDICATION Take 1 tablet by mouth 2 (two) times daily. Triflex   OVER THE COUNTER MEDICATION Take 2 tablets by mouth daily. Hydroeye   OVER THE COUNTER MEDICATION Take 1 tablet by mouth daily. Calcimate plus   potassium chloride (KLOR-CON) 8 MEQ tablet Take 1 tablet (  8 mEq total) by mouth daily.   triamterene-hydrochlorothiazide (MAXZIDE-25) 37.5-25 MG tablet Take 1 tablet by mouth daily.   XIIDRA 5 % SOLN Place 1 drop into both eyes 2 (two) times daily.   zolpidem (AMBIEN) 5 MG tablet TAKE 1 TABLET BY MOUTH EVERY DAY AT BEDTIME AS NEEDED FOR SLEEP   No facility-administered encounter medications on file as of 12/13/2022.    Allergies (verified) Clindamycin, Aspirin, Fish oil, Latex, and Penicillins   History: Past Medical History:  Diagnosis Date   Anxiety    Cataract     Diverticulitis    FRACTURE, ARM, LEFT ~ [place], FOOT ~ [place], INDEX FINGER, LEFT ~ 1960   Heart murmur    patient denies this dx on 01/28/21   HYPERTENSION 11/15/2007   INSOMNIA, CHRONIC 06/17/2009   MITRAL VALVE PROLAPSE, HX OF    OSTEOARTHRITIS 11/15/2007   Subdural hematoma 03/2015   S/P fall   Past Surgical History:  Procedure Laterality Date   CATARACT EXTRACTION W/ INTRAOCULAR LENS IMPLANT Left 01/2015   COLON RESECTION N/A 07/28/2016   Procedure: LAPAROSCOPIC ASSISTED  COLOSTOMY TAKEDOWN WITH COLORECTAL ANASTOMOSIS;  Surgeon: Mickeal Skinner, MD;  Location: [facility];  Service: General;  Laterality: N/A;   COLON SURGERY     COLONOSCOPY     COLONOSCOPY WITH PROPOFOL N/A 01/29/2021   Procedure: COLONOSCOPY WITH PROPOFOL;  Surgeon: Irving Copas., MD;  Location: [facility];  Service: Gastroenterology;  Laterality: N/A;   COLONOSCOPY WITH PROPOFOL N/A 10/13/2022   Procedure: COLONOSCOPY WITH PROPOFOL;  Surgeon: Rush Landmark Telford Nab., MD;  Location: [facility];  Service: Gastroenterology;  Laterality: N/A;   COLOSTOMY TAKEDOWN  07/28/2016   laparoscopic converted to open take down of end colostomy with colo-rectostomy/notes 07/28/2016   ENDOSCOPIC MUCOSAL RESECTION N/A 01/29/2021   Procedure: ENDOSCOPIC MUCOSAL RESECTION;  Surgeon: Irving Copas., MD;  Location: [facility] END[facility];  Service: Gastroenterology;  Laterality: N/A;   EYE SURGERY     HEMOSTASIS CLIP PLACEMENT  01/29/2021   Procedure: HEMOSTASIS CLIP PLACEMENT;  Surgeon: Irving Copas., MD;  Location: [facility];  Service: Gastroenterology;;   PARTIAL COLECTOMY N/A 02/11/2016   Procedure: PARTIAL COLECTOMY with colostomy;  Surgeon: Mickeal Skinner, MD;  Location: [facility];  Service: General;  Laterality: N/A;   POLYPECTOMY  01/29/2021   Procedure: POLYPECTOMY;  Surgeon: Irving Copas., MD;  Location: [facility];  Service: Gastroenterology;;   POLYPECTOMY  10/13/2022    Procedure: POLYPECTOMY;  Surgeon: Irving Copas., MD;  Location: [facility];  Service: Gastroenterology;;   SUBMUCOSAL LIFTING INJECTION  01/29/2021   Procedure: SUBMUCOSAL LIFTING INJECTION;  Surgeon: Irving Copas., MD;  Location: [facility];  Service: Gastroenterology;;   TONSILLECTOMY     Family History  Problem Relation Age of Onset   Breast cancer Mother    Hypertension Father    Cancer Father        LUNG   Parkinsonism Other    Colon cancer Neg Hx    Esophageal cancer Neg Hx    Colon polyps Neg Hx    Stomach cancer Neg Hx    Inflammatory bowel disease Neg Hx    Liver disease Neg Hx    Pancreatic cancer Neg Hx    Rectal cancer Neg Hx    Social History   Socioeconomic History   Marital status: Divorced    Spouse name: Not on file   Number of children: 2   Years of education: Not on  file   Highest education level: Not on file  Occupational History   Occupation: Home maker  Tobacco Use   Smoking status: Never   Smokeless tobacco: Never  Vaping Use   Vaping Use: Never used  Substance and Sexual Activity   Alcohol use: Yes    Alcohol/week: 1.0 standard drink of alcohol    Types: 1 Glasses of wine per week    Comment: occasional wine   Drug use: No   Sexual activity: Not Currently    Partners: Male    Birth control/protection: Post-menopausal    Comment: older than 25, more than 5  Other Topics Concern   Not on file  Social History Narrative   [place], [place]   Married '72-17 yrs then divorced; remarried '90 - 4 yrs divorced. 1 boy - '75; 1 daughter '83. Single partner since 2001. Socially active, riding horses not hunting but jumping little jumps. Lost her mother Spring '12 - doing OK with loss.   Social Determinants of Health   Financial Resource Strain: Low Risk  (12/13/2022)   Overall Financial Resource Strain (CARDIA)    Difficulty of Paying Living Expenses: Not hard at all  Food Insecurity: No Food Insecurity (12/13/2022)    Hunger Vital Sign    Worried About Running Out of Food in the Last Year: Never true    Ran Out of Food in the Last Year: Never true  Transportation Needs: No Transportation Needs (12/13/2022)   PRAPARE - Hydrologist (Medical): No    Lack of Transportation (Non-Medical): No  Physical Activity: Sufficiently Active (12/13/2022)   Exercise Vital Sign    Days of Exercise per Week: 7 days    Minutes of Exercise per Session: 60 min  Stress: No Stress Concern Present (12/13/2022)   [place]    Feeling of Stress : Not at all  Social Connections: Moderately Integrated (12/13/2022)   Social Connection and Isolation Panel [NHANES]    Frequency of Communication with Friends and Family: Twice a week    Frequency of Social Gatherings with Friends and Family: Twice a week    Attends Religious Services: More than 4 times per year    Active Member of [employer] or Organizations: Yes    Attends Archivist Meetings: 1 to 4 times per year    Marital Status: Divorced    Tobacco Counseling Counseling given: Not Answered   Clinical Intake:  Pre-visit preparation completed: Yes  Pain : No/denies pain Pain Score: 0-No pain     BMI - recorded: 22.84 Nutritional Status: BMI of 19-24  Normal Nutritional Risks: None Diabetes: No  How often do you need to have someone help you when you read instructions, pamphlets, or other written materials from your doctor or pharmacy?: 1 - Never What is the last grade level you completed in school?: HSG  Diabetic? No  Interpreter Needed?: No  Information entered by :: Lisette Abu, LPN.   Activities of Daily Living    12/13/2022    8:55 AM  In your present state of health, do you have any difficulty performing the following activities:  Hearing? 0  Vision? 0  Difficulty concentrating or making decisions? 0  Walking or climbing stairs? 0  Dressing or  bathing? 0  Doing errands, shopping? 0  Preparing Food and eating ? N  Using the Toilet? N  In the past six months, have you accidently leaked urine? N  Do you have problems with loss of bowel control? N  Managing your Medications? N  Managing your Finances? N  Housekeeping or managing your Housekeeping? N    Patient Care Team: Plotnikov, Evie Lacks, MD as PCP - General (Internal Medicine) Kinsinger, Arta Bruce, MD as Consulting Physician (General Surgery)  Indicate any recent Medical Services you may have received from other than [facility] providers in the past year (date may be approximate).     Assessment:   This is a routine wellness examination for Sue [name].  Hearing/Vision screen Hearing Screening - Comments:: Denies hearing difficulties   Vision Screening - Comments:: Wears rx glasses - up to date with routine eye exams with Gala Romney, MD.   Dietary issues and exercise activities discussed: Current Exercise Habits: Home exercise routine;Structured exercise class, Type of exercise: walking;treadmill;stretching;strength training/weights;exercise ball;calisthenics;Other - see comments (pickle ball, golf, tennis), Time (Minutes): 60, Frequency (Times/Week): 7, Weekly Exercise (Minutes/Week): 420, Intensity: Moderate, Exercise limited by: None identified   Goals Addressed   None   Depression Screen    12/13/2022    8:55 AM 09/23/2022   11:01 AM 08/02/2022   10:25 AM 12/11/2021    2:17 PM 12/11/2021    2:06 PM 11/19/2020    3:16 PM 10/23/2019   10:26 AM  PHQ 2/9 Scores  PHQ - 2 Score 0 0 0 0 0 0 0    Fall Risk    12/13/2022    8:53 AM 09/23/2022   11:01 AM 08/02/2022   10:25 AM 12/11/2021    2:08 PM 11/19/2020    3:18 PM  [place] in the past year? 0 0 0 0 0  Number falls in past yr: 0 0 0 0 0  Injury with Fall? 0 0 0 0 0  Risk for fall due to : No Fall Risks No Fall Risks No Fall Risks  No Fall Risks  Follow up Falls prevention discussed Falls evaluation completed  Falls evaluation completed Falls evaluation completed Falls evaluation completed    [place]:  Any stairs in or around the home? Yes  If so, are there any without handrails? No  Home free of loose throw rugs in walkways, pet beds, electrical cords, etc? Yes  Adequate lighting in your home to reduce risk of falls? Yes   ASSISTIVE DEVICES UTILIZED TO PREVENT FALLS:  Life alert? No  Use of a cane, walker or w/c? No  Grab bars in the bathroom? No  Shower chair or bench in shower? Yes  Elevated toilet seat or a handicapped toilet? Yes   TIMED UP AND GO:  Was the test performed? No . Telephonic Visit   Cognitive Function:        12/13/2022    8:55 AM  6CIT Screen  What Year? 0 points  What month? 0 points  What time? 0 points  Count back from 20 0 points  Months in reverse 0 points  Repeat phrase 0 points  Total Score 0 points    Immunizations Immunization History  Administered Date(s) Administered   Td 09/15/2006   Tdap 06/16/2019    TDAP status: Up to date  Flu Vaccine status: Declined, Education has been provided regarding the importance of this vaccine but patient still declined. Advised may receive this vaccine at local pharmacy or Health Dept. Aware to provide a copy of the vaccination record if obtained from local pharmacy or Health Dept. Verbalized acceptance and understanding.  Pneumococcal vaccine status:  Declined,  Education has been provided regarding the importance of this vaccine but patient still declined. Advised may receive this vaccine at local pharmacy or Health Dept. Aware to provide a copy of the vaccination record if obtained from local pharmacy or Health Dept. Verbalized acceptance and understanding.   Covid-19 vaccine status: Declined, Education has been provided regarding the importance of this vaccine but patient still declined. Advised may receive this vaccine at local pharmacy or Health Dept.or vaccine clinic.  Aware to provide a copy of the vaccination record if obtained from local pharmacy or Health Dept. Verbalized acceptance and understanding.  Qualifies for Shingles Vaccine? Yes   Zostavax completed No   Shingrix Completed?: No.    Education has been provided regarding the importance of this vaccine. Patient has been advised to call insurance company to determine out of pocket expense if they have not yet received this vaccine. Advised may also receive vaccine at local pharmacy or Health Dept. Verbalized acceptance and understanding.  Screening Tests Health Maintenance  Topic Date Due   Zoster Vaccines- Shingrix (1 of 2) Never done   Pneumonia Vaccine 79+ Years old (1 of 1 - PCV) Never done   Fecal DNA (Cologuard)  08/19/2023   MAMMOGRAM  10/23/2023   Medicare Annual Wellness (AWV)  12/13/2023   DTaP/Tdap/Td (3 - Td or Tdap) 06/15/2029   DEXA SCAN  Completed   Hepatitis C Screening  Completed   HPV VACCINES  Aged Out   INFLUENZA VACCINE  Discontinued   COVID-19 Vaccine  Discontinued    Health Maintenance  Health Maintenance Due  Topic Date Due   Zoster Vaccines- Shingrix (1 of 2) Never done   Pneumonia Vaccine 8+ Years old (1 of 1 - PCV) Never done    Colorectal cancer screening: Type of screening: Colonoscopy. Completed 10/13/2022. Repeat every 3 years  Mammogram status: Completed 10/22/2022. Repeat every year  Bone Density status: Completed 11/03/2016. Results reflect: Bone density results: NORMAL. Repeat every 5 years.  Lung Cancer Screening: (Low Dose CT Chest recommended if Age 66-80 years, 30 pack-year currently smoking OR have quit w/in 15years.) does not qualify.   Lung Cancer Screening Referral: no  Additional Screening:  Hepatitis C Screening: does qualify; Completed 12/03/2016  Vision Screening: Recommended annual ophthalmology exams for early detection of glaucoma and other disorders of the eye. Is the patient up to date with their annual eye exam?  Yes  Who is the  provider or what is the name of the office in which the patient attends annual eye exams? Gala Romney, MD. If pt is not established with a provider, would they like to be referred to a provider to establish care? No .   Dental Screening: Recommended annual dental exams for proper oral hygiene  Community Resource Referral / Chronic Care Management: CRR required this visit?  No   CCM required this visit?  No      Plan:     I have personally reviewed and noted the following in the patient's chart:   Medical and social history Use of alcohol, tobacco or illicit drugs  Current medications and supplements including opioid prescriptions. Patient is not currently taking opioid prescriptions. Functional ability and status Nutritional status Physical activity Advanced directives List of other physicians Hospitalizations, surgeries, and ER visits in previous 12 months Vitals Screenings to include cognitive, depression, and falls Referrals and appointments  In addition, I have reviewed and discussed with patient certain preventive protocols, quality metrics, and best practice recommendations. A written  personalized care plan for preventive services as well as general preventive health recommendations were provided to patient.     Sheral Flow, LPN   [id number]   Nurse Notes:  Normal cognitive status assessed by direct observation by phone visit with this Nurse Health Advisor. No abnormalities found.   Medical screening examination/treatment/procedure(s) were performed by non-physician practitioner and as supervising physician I was immediately available for consultation/collaboration.  I agree with above. Lew Dawes, MD

## 2022-12-27 DIAGNOSIS — M7661 Achilles tendinitis, right leg: Secondary | ICD-10-CM | POA: Diagnosis not present

## 2022-12-30 DIAGNOSIS — M7661 Achilles tendinitis, right leg: Secondary | ICD-10-CM | POA: Diagnosis not present

## 2023-01-03 DIAGNOSIS — S86011D Strain of right Achilles tendon, subsequent encounter: Secondary | ICD-10-CM | POA: Diagnosis not present

## 2023-01-03 DIAGNOSIS — R269 Unspecified abnormalities of gait and mobility: Secondary | ICD-10-CM | POA: Diagnosis not present

## 2023-01-03 DIAGNOSIS — M76821 Posterior tibial tendinitis, right leg: Secondary | ICD-10-CM | POA: Diagnosis not present

## 2023-01-03 DIAGNOSIS — M6281 Muscle weakness (generalized): Secondary | ICD-10-CM | POA: Diagnosis not present

## 2023-01-10 DIAGNOSIS — S86011D Strain of right Achilles tendon, subsequent encounter: Secondary | ICD-10-CM | POA: Diagnosis not present

## 2023-01-10 DIAGNOSIS — M76821 Posterior tibial tendinitis, right leg: Secondary | ICD-10-CM | POA: Diagnosis not present

## 2023-01-10 DIAGNOSIS — M6281 Muscle weakness (generalized): Secondary | ICD-10-CM | POA: Diagnosis not present

## 2023-01-10 DIAGNOSIS — R269 Unspecified abnormalities of gait and mobility: Secondary | ICD-10-CM | POA: Diagnosis not present

## 2023-01-17 DIAGNOSIS — S86011D Strain of right Achilles tendon, subsequent encounter: Secondary | ICD-10-CM | POA: Diagnosis not present

## 2023-01-17 DIAGNOSIS — R269 Unspecified abnormalities of gait and mobility: Secondary | ICD-10-CM | POA: Diagnosis not present

## 2023-01-17 DIAGNOSIS — M76821 Posterior tibial tendinitis, right leg: Secondary | ICD-10-CM | POA: Diagnosis not present

## 2023-01-17 DIAGNOSIS — M6281 Muscle weakness (generalized): Secondary | ICD-10-CM | POA: Diagnosis not present

## 2023-01-24 DIAGNOSIS — R269 Unspecified abnormalities of gait and mobility: Secondary | ICD-10-CM | POA: Diagnosis not present

## 2023-01-24 DIAGNOSIS — L638 Other alopecia areata: Secondary | ICD-10-CM | POA: Diagnosis not present

## 2023-01-24 DIAGNOSIS — S86011D Strain of right Achilles tendon, subsequent encounter: Secondary | ICD-10-CM | POA: Diagnosis not present

## 2023-01-24 DIAGNOSIS — L812 Freckles: Secondary | ICD-10-CM | POA: Diagnosis not present

## 2023-01-24 DIAGNOSIS — M76821 Posterior tibial tendinitis, right leg: Secondary | ICD-10-CM | POA: Diagnosis not present

## 2023-01-24 DIAGNOSIS — M6281 Muscle weakness (generalized): Secondary | ICD-10-CM | POA: Diagnosis not present

## 2023-01-24 DIAGNOSIS — L821 Other seborrheic keratosis: Secondary | ICD-10-CM | POA: Diagnosis not present

## 2023-01-24 DIAGNOSIS — D1801 Hemangioma of skin and subcutaneous tissue: Secondary | ICD-10-CM | POA: Diagnosis not present

## 2023-01-24 DIAGNOSIS — L308 Other specified dermatitis: Secondary | ICD-10-CM | POA: Diagnosis not present

## 2023-01-24 DIAGNOSIS — Z85828 Personal history of other malignant neoplasm of skin: Secondary | ICD-10-CM | POA: Diagnosis not present

## 2023-02-01 ENCOUNTER — Ambulatory Visit (INDEPENDENT_AMBULATORY_CARE_PROVIDER_SITE_OTHER): Payer: Medicare Other | Admitting: Obstetrics & Gynecology

## 2023-02-01 ENCOUNTER — Encounter: Payer: Self-pay | Admitting: Obstetrics & Gynecology

## 2023-02-01 VITALS — BP 116/74 | HR 57 | Wt 133.0 lb

## 2023-02-01 DIAGNOSIS — Z79890 Hormone replacement therapy (postmenopausal): Secondary | ICD-10-CM | POA: Diagnosis not present

## 2023-02-01 DIAGNOSIS — Z9189 Other specified personal risk factors, not elsewhere classified: Secondary | ICD-10-CM | POA: Diagnosis not present

## 2023-02-01 DIAGNOSIS — Z01419 Encounter for gynecological examination (general) (routine) without abnormal findings: Secondary | ICD-10-CM

## 2023-02-01 MED ORDER — ESTRACE 0.1 MG/GM VA CREA: 0.2500 | TOPICAL_CREAM | VAGINAL | 2 refills | Status: DC

## 2023-02-01 MED ORDER — ESTERIFIED ESTROGENS 0.3 MG PO TABS: 0.1500 mg | ORAL_TABLET | Freq: Every day | ORAL | 4 refills | Status: DC

## 2023-02-01 NOTE — Progress Notes (Unsigned)
Sue [name] [birth year] [id number]   History:    72 y.o. G1P1L2 Long standing boyfriend.   RP:  Established patient presenting for annual gyn exam    HPI: Postmenopause, well on Menest 0.3 1/2 tab q2 days, refuses to d/c. Declines combining with Progesterone, understands the risk of Endometrial Cancer.  Will repeat a Pelvic US to confirm a thin endometrial line when back from her annual mountain trip in 6 months.  Patient refuses to stop Estradiol HRT, understands the risk of Breast Ca and Stroke. Estrace cream once a week or less. No PMB.  No pelvic pain.  Occasional vulvar irritation, responds very well to Clobetasol when needed.  Abstinent. H/O LEEP 2008. Paps normal since then, last pap 12/2019 Negative.  No indication for a Pap at this time.  Urine/BMs wnl.  Had a bowel resection because of perforation associated with diverticulitis in 2017. Breasts wnl. Mammo 10/2022 Negative.  BD normal in 10/2016.  Will repeat at 72 yo. BMI 23.19. Good fitness with Pickle Ball and Pilates and healthy nutrition.  Colono 09/2022. Health labs with Fam MD.   Past medical history,surgical history, family history and social history were all reviewed and documented in the EPIC chart.  Gynecologic History No LMP recorded (lmp unknown). Patient is postmenopausal.  Obstetric History OB History  Gravida Para Term Preterm AB Living  1 1       1   SAB IAB Ectopic Multiple Live Births               # Outcome Date GA Lbr Len/2nd Weight Sex Delivery Anes PTL Lv  1 Para             Obstetric Comments  1 son+ 1 adopted daughter     ROS: A ROS was performed and pertinent positives and negatives are included in the history. GENERAL: No fevers or chills. HEENT: No change in vision, no earache, sore throat or sinus congestion. NECK: No pain or stiffness. CARDIOVASCULAR: No chest pain or pressure. No palpitations. PULMONARY: No shortness of breath, cough or wheeze. GASTROINTESTINAL: No abdominal pain, nausea,  vomiting or diarrhea, melena or bright red blood per rectum. GENITOURINARY: No urinary frequency, urgency, hesitancy or dysuria. MUSCULOSKELETAL: No joint or muscle pain, no back pain, no recent trauma. DERMATOLOGIC: No rash, no itching, no lesions. ENDOCRINE: No polyuria, polydipsia, no heat or cold intolerance. No recent change in weight. HEMATOLOGICAL: No anemia or easy bruising or bleeding. NEUROLOGIC: No headache, seizures, numbness, tingling or weakness. PSYCHIATRIC: No depression, no loss of interest in normal activity or change in sleep pattern.     Exam:   Pulse (!) 57   Wt 133 lb (60.3 kg)   LMP  (LMP Unknown) Comment: not sexually active  SpO2 98%   BMI 23.19 kg/m   Body mass index is 23.19 kg/m.  General appearance : Well developed well nourished female. No acute distress HEENT: Eyes: no retinal hemorrhage or exudates,  Neck supple, trachea midline, no carotid bruits, no thyroidmegaly Lungs: Clear to auscultation, no rhonchi or wheezes, or rib retractions  Heart: Regular rate and rhythm, no murmurs or gallops Breast:Examined in sitting and supine position were symmetrical in appearance, no palpable masses or tenderness,  no skin retraction, no nipple inversion, no nipple discharge, no skin discoloration, no axillary or supraclavicular lymphadenopathy Abdomen: no palpable masses or tenderness, no rebound or guarding Extremities: no edema or skin discoloration or tenderness  Pelvic: Vulva: Normal  Vagina: No gross lesions or discharge  Cervix: No gross lesions or discharge  Uterus  AV, normal size, shape and consistency, non-tender and mobile  Adnexa  Without masses or tenderness  Anus: Normal   Assessment/Plan:  72 y.o. female for annual exam   1. Well female exam with routine gynecological exam Postmenopause, well on Menest 0.3 1/2 tab q2 days, refuses to d/c. Declines combining with Progesterone, understands the risk of Endometrial Cancer.  Will repeat a  Pelvic US to confirm a thin endometrial line when back from her annual mountain trip in 6 months.  Patient refuses to stop Estradiol HRT, understands the risk of Breast Ca and Stroke. Estrace cream once a week or less. No PMB.  No pelvic pain.  Occasional vulvar irritation, responds very well to Clobetasol when needed.  Abstinent. H/O LEEP 2008. Paps normal since then, last pap 12/2019 Negative.  No indication for a Pap at this time.  Urine/BMs wnl.  Had a bowel resection because of perforation associated with diverticulitis in 2017. Breasts wnl. Mammo 10/2022 Negative.  BD normal in 10/2016.  Will repeat at 72 yo. BMI 23.19. Good fitness with Pickle Ball and Pilates and healthy nutrition.  Colono 09/2022. Health labs with Fam MD.  2. Postmenopausal hormone replacement therapy Postmenopause, well on Menest 0.3 1/2 tab q2 days, refuses to d/c. Declines combining with Progesterone, understands the risk of Endometrial Cancer.  Will repeat a Pelvic US to confirm a thin endometrial line when back from her annual mountain trip in 6 months.  Patient refuses to stop Estradiol HRT, understands the risk of Breast Ca and Stroke. Estrace cream once a week or less. No PMB.  No pelvic pain.  - US Transvaginal Non-OB; Future  Other orders - Dapsone 5 % topical gel; SMARTSIG:1 Sparingly Topical Daily - Esterified Estrogens 0.3 MG tablet; Take 0.5 tablets (0.15 mg total) by mouth daily. - ESTRACE VAGINAL 0.1 MG/GM vaginal cream; Place 0.25 Applicatorfuls vaginally once a week.   Genia Del MD, 4:05 PM

## 2023-02-02 ENCOUNTER — Encounter: Payer: Self-pay | Admitting: Obstetrics & Gynecology

## 2023-03-23 DIAGNOSIS — M7661 Achilles tendinitis, right leg: Secondary | ICD-10-CM | POA: Diagnosis not present

## 2023-03-25 DIAGNOSIS — M25671 Stiffness of right ankle, not elsewhere classified: Secondary | ICD-10-CM | POA: Diagnosis not present

## 2023-03-25 DIAGNOSIS — R2689 Other abnormalities of gait and mobility: Secondary | ICD-10-CM | POA: Diagnosis not present

## 2023-03-25 DIAGNOSIS — M7661 Achilles tendinitis, right leg: Secondary | ICD-10-CM | POA: Diagnosis not present

## 2023-03-25 DIAGNOSIS — M25571 Pain in right ankle and joints of right foot: Secondary | ICD-10-CM | POA: Diagnosis not present

## 2023-03-25 DIAGNOSIS — M62571 Muscle wasting and atrophy, not elsewhere classified, right ankle and foot: Secondary | ICD-10-CM | POA: Diagnosis not present

## 2023-03-28 DIAGNOSIS — M7661 Achilles tendinitis, right leg: Secondary | ICD-10-CM | POA: Diagnosis not present

## 2023-03-28 DIAGNOSIS — R2689 Other abnormalities of gait and mobility: Secondary | ICD-10-CM | POA: Diagnosis not present

## 2023-03-28 DIAGNOSIS — M25571 Pain in right ankle and joints of right foot: Secondary | ICD-10-CM | POA: Diagnosis not present

## 2023-03-28 DIAGNOSIS — M62571 Muscle wasting and atrophy, not elsewhere classified, right ankle and foot: Secondary | ICD-10-CM | POA: Diagnosis not present

## 2023-03-28 DIAGNOSIS — M25671 Stiffness of right ankle, not elsewhere classified: Secondary | ICD-10-CM | POA: Diagnosis not present

## 2023-04-01 DIAGNOSIS — M25571 Pain in right ankle and joints of right foot: Secondary | ICD-10-CM | POA: Diagnosis not present

## 2023-04-01 DIAGNOSIS — R2689 Other abnormalities of gait and mobility: Secondary | ICD-10-CM | POA: Diagnosis not present

## 2023-04-01 DIAGNOSIS — M7661 Achilles tendinitis, right leg: Secondary | ICD-10-CM | POA: Diagnosis not present

## 2023-04-01 DIAGNOSIS — M62571 Muscle wasting and atrophy, not elsewhere classified, right ankle and foot: Secondary | ICD-10-CM | POA: Diagnosis not present

## 2023-04-01 DIAGNOSIS — M25671 Stiffness of right ankle, not elsewhere classified: Secondary | ICD-10-CM | POA: Diagnosis not present

## 2023-04-04 DIAGNOSIS — M62571 Muscle wasting and atrophy, not elsewhere classified, right ankle and foot: Secondary | ICD-10-CM | POA: Diagnosis not present

## 2023-04-04 DIAGNOSIS — M25671 Stiffness of right ankle, not elsewhere classified: Secondary | ICD-10-CM | POA: Diagnosis not present

## 2023-04-04 DIAGNOSIS — R2689 Other abnormalities of gait and mobility: Secondary | ICD-10-CM | POA: Diagnosis not present

## 2023-04-04 DIAGNOSIS — M25571 Pain in right ankle and joints of right foot: Secondary | ICD-10-CM | POA: Diagnosis not present

## 2023-04-04 DIAGNOSIS — M7661 Achilles tendinitis, right leg: Secondary | ICD-10-CM | POA: Diagnosis not present

## 2023-04-11 ENCOUNTER — Other Ambulatory Visit: Payer: Self-pay | Admitting: *Deleted

## 2023-04-13 MED ORDER — ALPRAZOLAM 0.25 MG PO TABS: ORAL_TABLET | ORAL | 1 refills | Status: DC

## 2023-04-13 NOTE — Telephone Encounter (Signed)
Patient has call again need refill for alprazolam../lmb

## 2023-04-14 DIAGNOSIS — M7661 Achilles tendinitis, right leg: Secondary | ICD-10-CM | POA: Diagnosis not present

## 2023-05-11 ENCOUNTER — Ambulatory Visit: Payer: Medicare Other | Admitting: Gastroenterology

## 2023-05-26 DIAGNOSIS — M7661 Achilles tendinitis, right leg: Secondary | ICD-10-CM | POA: Diagnosis not present

## 2023-06-07 DIAGNOSIS — H9011 Conductive hearing loss, unilateral, right ear, with unrestricted hearing on the contralateral side: Secondary | ICD-10-CM | POA: Insufficient documentation

## 2023-06-07 DIAGNOSIS — H6121 Impacted cerumen, right ear: Secondary | ICD-10-CM | POA: Insufficient documentation

## 2023-06-14 DIAGNOSIS — M9903 Segmental and somatic dysfunction of lumbar region: Secondary | ICD-10-CM | POA: Diagnosis not present

## 2023-06-17 ENCOUNTER — Other Ambulatory Visit: Payer: Self-pay | Admitting: Internal Medicine

## 2023-06-20 ENCOUNTER — Other Ambulatory Visit: Payer: Self-pay | Admitting: Obstetrics & Gynecology

## 2023-06-20 NOTE — Telephone Encounter (Signed)
Med refill request: Menest  Last AEX: 02/01/2023-ML Next AEX: recall placed for 01/2024 Last MMG (if hormonal med): 10/22/2022-neg birads 1, Cat C Refill authorized: rx denied.  Rx sent by ML on 02/01/2023 for #45 with 4 refills. Routing to provider for final review and closing encounter.

## 2023-06-21 ENCOUNTER — Ambulatory Visit: Payer: Medicare Other | Admitting: Gastroenterology

## 2023-06-21 DIAGNOSIS — M9903 Segmental and somatic dysfunction of lumbar region: Secondary | ICD-10-CM | POA: Diagnosis not present

## 2023-06-28 DIAGNOSIS — M9903 Segmental and somatic dysfunction of lumbar region: Secondary | ICD-10-CM | POA: Diagnosis not present

## 2023-07-05 DIAGNOSIS — M9903 Segmental and somatic dysfunction of lumbar region: Secondary | ICD-10-CM | POA: Diagnosis not present

## 2023-07-06 ENCOUNTER — Telehealth: Payer: Self-pay

## 2023-07-06 NOTE — Telephone Encounter (Signed)
-----   Message from Cecil Cranker sent at 07/06/2023  1:26 PM EDT ----- Regarding: u/s wait list for nov [place] triage -  We called patient from our u/s "wait list" to schedule u/s & ov w/JC  Pt stated she was "clueless" as to why ML ordered this back in May........I informed patient clinic would review ML's notes and call patient to explain from clinic side, then she will decide if she wants to proceed w/u/s.  Thanks, Lupita Leash

## 2023-07-07 NOTE — Telephone Encounter (Signed)
Spoke w/ pt and advised her and reminded her per notes from her visit w/ ML in 01/2023: since she is on unopposed estrogen and the increased risk of uterine cancer, it was recommended that she have a pelvic US to check on her uterine lining.   Pt voiced understanding and recalled conversation, she just wanted to confirm how necessary it actually was for her to have test done being that she was on such a low dose of the estrogen? I advised her that I would inquire with a provider. However, it will likely be very highly recommended no matter what dose she is on since she is not taking the progesterone. Pt voiced understanding and is agreeable to schedule [place] at her earliest convenience.  Routing to provider for review but will keep in triage pool for f/u purposes.

## 2023-07-07 NOTE — Telephone Encounter (Signed)
Needs u/s. Also, I have not seen patient yet- please make patient aware I will not refill unopposed estrogen due to risks incase she would like to see another provider.

## 2023-07-15 DIAGNOSIS — M545 Low back pain, unspecified: Secondary | ICD-10-CM | POA: Diagnosis not present

## 2023-07-15 DIAGNOSIS — M546 Pain in thoracic spine: Secondary | ICD-10-CM | POA: Diagnosis not present

## 2023-07-18 DIAGNOSIS — Z961 Presence of intraocular lens: Secondary | ICD-10-CM | POA: Diagnosis not present

## 2023-07-18 DIAGNOSIS — H40013 Open angle with borderline findings, low risk, bilateral: Secondary | ICD-10-CM | POA: Diagnosis not present

## 2023-07-21 ENCOUNTER — Other Ambulatory Visit: Payer: Self-pay | Admitting: Internal Medicine

## 2023-07-27 DIAGNOSIS — L812 Freckles: Secondary | ICD-10-CM | POA: Diagnosis not present

## 2023-07-27 DIAGNOSIS — D1801 Hemangioma of skin and subcutaneous tissue: Secondary | ICD-10-CM | POA: Diagnosis not present

## 2023-07-27 DIAGNOSIS — Z85828 Personal history of other malignant neoplasm of skin: Secondary | ICD-10-CM | POA: Diagnosis not present

## 2023-07-27 DIAGNOSIS — D225 Melanocytic nevi of trunk: Secondary | ICD-10-CM | POA: Diagnosis not present

## 2023-07-27 DIAGNOSIS — L2089 Other atopic dermatitis: Secondary | ICD-10-CM | POA: Diagnosis not present

## 2023-07-27 DIAGNOSIS — D2271 Melanocytic nevi of right lower limb, including hip: Secondary | ICD-10-CM | POA: Diagnosis not present

## 2023-07-27 DIAGNOSIS — D2262 Melanocytic nevi of left upper limb, including shoulder: Secondary | ICD-10-CM | POA: Diagnosis not present

## 2023-07-27 DIAGNOSIS — L821 Other seborrheic keratosis: Secondary | ICD-10-CM | POA: Diagnosis not present

## 2023-08-02 ENCOUNTER — Other Ambulatory Visit: Payer: Self-pay | Admitting: Radiology

## 2023-08-02 DIAGNOSIS — Z79890 Hormone replacement therapy (postmenopausal): Secondary | ICD-10-CM

## 2023-08-04 ENCOUNTER — Other Ambulatory Visit: Payer: Medicare Other | Admitting: Radiology

## 2023-08-04 ENCOUNTER — Other Ambulatory Visit: Payer: Medicare Other

## 2023-08-05 NOTE — Telephone Encounter (Signed)
Pt notified and voiced understanding. Scheduled for OV and [place] w/ JC on 08/09/2023.  Routing to provider for final review and closing encounter.

## 2023-08-09 ENCOUNTER — Other Ambulatory Visit: Payer: Medicare Other | Admitting: Radiology

## 2023-08-09 ENCOUNTER — Ambulatory Visit: Payer: Medicare Other

## 2023-08-09 ENCOUNTER — Other Ambulatory Visit: Payer: Self-pay | Admitting: Obstetrics and Gynecology

## 2023-08-09 ENCOUNTER — Other Ambulatory Visit: Payer: Self-pay | Admitting: Radiology

## 2023-08-09 ENCOUNTER — Other Ambulatory Visit: Payer: Medicare Other

## 2023-08-09 DIAGNOSIS — Z79890 Hormone replacement therapy (postmenopausal): Secondary | ICD-10-CM

## 2023-08-10 ENCOUNTER — Encounter: Payer: Self-pay | Admitting: Internal Medicine

## 2023-08-10 ENCOUNTER — Ambulatory Visit (INDEPENDENT_AMBULATORY_CARE_PROVIDER_SITE_OTHER): Payer: Medicare Other | Admitting: Internal Medicine

## 2023-08-10 VITALS — BP 118/70 | HR 56 | Temp 98.6°F | Ht 63.5 in | Wt 136.0 lb

## 2023-08-10 DIAGNOSIS — D122 Benign neoplasm of ascending colon: Secondary | ICD-10-CM | POA: Diagnosis not present

## 2023-08-10 DIAGNOSIS — E785 Hyperlipidemia, unspecified: Secondary | ICD-10-CM | POA: Diagnosis not present

## 2023-08-10 DIAGNOSIS — F419 Anxiety disorder, unspecified: Secondary | ICD-10-CM | POA: Diagnosis not present

## 2023-08-10 DIAGNOSIS — I2583 Coronary atherosclerosis due to lipid rich plaque: Secondary | ICD-10-CM

## 2023-08-10 DIAGNOSIS — I10 Essential (primary) hypertension: Secondary | ICD-10-CM | POA: Diagnosis not present

## 2023-08-10 MED ORDER — METOPROLOL SUCCINATE ER 25 MG PO TB24: 25.0000 mg | ORAL_TABLET | Freq: Every morning | ORAL | 3 refills | Status: DC

## 2023-08-10 MED ORDER — MELOXICAM 7.5 MG PO TABS: 7.5000 mg | ORAL_TABLET | Freq: Every day | ORAL | 0 refills | Status: AC | PRN

## 2023-08-10 MED ORDER — ZOLPIDEM TARTRATE 5 MG PO TABS: ORAL_TABLET | ORAL | 1 refills | Status: DC

## 2023-08-10 MED ORDER — ALPRAZOLAM 0.25 MG PO TABS: ORAL_TABLET | ORAL | 1 refills | Status: DC

## 2023-08-10 MED ORDER — TRIAMTERENE-HCTZ 37.5-25 MG PO TABS: 1.0000 | ORAL_TABLET | Freq: Every day | ORAL | 3 refills | Status: DC

## 2023-08-10 NOTE — Assessment & Plan Note (Signed)
H/o large colon polyp - OK bx 18 mm - another colonoscopy is pending in May 2022 Dr. Meridee Score Las Colon in 2024, due in 2029

## 2023-08-10 NOTE — Progress Notes (Signed)
Subjective:  Patient ID: Sue [name], female    DOB: [birth year]  Age: 72 y.o. MRN: [id number]  CC: Annual Exam   HPI KENNY TIBERIO presents for HTN, anxiety, insomnia  Outpatient Medications Prior to Visit  Medication Sig Dispense Refill   clobetasol ointment (TEMOVATE) 0.05 % Apply 1 application  topically daily as needed (Rash).     Dapsone 5 % topical gel SMARTSIG:1 Sparingly Topical Daily     Esterified Estrogens 0.3 MG tablet Take 0.5 tablets (0.15 mg total) by mouth daily. 45 tablet 4   ESTRACE VAGINAL 0.1 MG/GM vaginal cream Place 0.25 Applicatorfuls vaginally once a week. 42.5 g 2   FINACEA 15 % gel Apply 1 application  topically daily as needed (Rosacea).     Multiple Vitamin (MULTIVITAMIN) capsule Take 1 capsule by mouth daily.     Multiple Vitamins-Minerals (HAIR SKIN & NAILS) TABS Take 1 tablet by mouth daily. GNC     OVER THE COUNTER MEDICATION Take 1 tablet by mouth 2 (two) times daily. Triflex     OVER THE COUNTER MEDICATION Take 2 tablets by mouth daily. Hydroeye     OVER THE COUNTER MEDICATION Take 1 tablet by mouth daily. Calcimate plus     potassium chloride (KLOR-CON) 8 MEQ tablet Take 1 tablet (8 mEq total) by mouth daily. 90 tablet 3   XIIDRA 5 % SOLN Place 1 drop into both eyes 2 (two) times daily.  2   ALPRAZolam (XANAX) 0.25 MG tablet TAKE 1 TABLET BY MOUTH THREE TIMES A DAY AS NEEDED FOR ANXIETY 90 tablet 1   diclofenac (VOLTAREN) 75 MG EC tablet TAKE 1 TABLET BY MOUTH EVERY DAY prn 30 tablet 1   meloxicam (MOBIC) 7.5 MG tablet Take 7.5 mg by mouth 2 (two) times daily as needed for pain.     metoprolol succinate (TOPROL-XL) 25 MG 24 hr tablet Take 1 tablet (25 mg total) by mouth in the morning and at bedtime. (Patient taking differently: Take 25 mg by mouth every morning. Additional 25 mg if needed at bedtime) 180 tablet 3   triamterene-hydrochlorothiazide (MAXZIDE-25) 37.5-25 MG tablet Take 1 tablet by mouth daily. 90 tablet 3   zolpidem (AMBIEN)  5 MG tablet TAKE 1 TABLET BY MOUTH EVERY DAY AT BEDTIME AS NEEDED FOR SLEEP 90 tablet 1   No facility-administered medications prior to visit.    ROS: Review of Systems  Constitutional:  Negative for activity change, appetite change, chills, fatigue and unexpected weight change.  HENT:  Negative for congestion, mouth sores and sinus pressure.   Eyes:  Negative for visual disturbance.  Respiratory:  Negative for cough and chest tightness.   Gastrointestinal:  Negative for abdominal pain and nausea.  Genitourinary:  Negative for difficulty urinating, frequency and vaginal pain.  Musculoskeletal:  Negative for back pain and gait problem.  Skin:  Negative for pallor and rash.  Neurological:  Negative for dizziness, tremors, weakness, numbness and headaches.  Psychiatric/Behavioral:  Negative for confusion, sleep disturbance and suicidal ideas.     Objective:  BP 118/70 (BP Location: Right Arm, Patient Position: Sitting, Cuff Size: Normal)   Pulse (!) 56   Temp 98.6 F (37 C) (Oral)   Ht 5' 3.5" (1.613 m)   Wt 136 lb (61.7 kg)   LMP  (LMP Unknown) Comment: not sexually active  SpO2 99%   BMI 23.71 kg/m   BP Readings from Last 3 Encounters:  08/10/23 118/70  02/01/23 116/74  10/13/22 (!) 202/89  Wt Readings from Last 3 Encounters:  08/10/23 136 lb (61.7 kg)  02/01/23 133 lb (60.3 kg)  12/13/22 131 lb (59.4 kg)    Physical Exam Constitutional:      General: She is not in acute distress.    Appearance: She is well-developed.  HENT:     Head: Normocephalic.     Right Ear: External ear normal.     Left Ear: External ear normal.     Nose: Nose normal.  Eyes:     General:        Right eye: No discharge.        Left eye: No discharge.     Conjunctiva/sclera: Conjunctivae normal.     Pupils: Pupils are equal, round, and reactive to light.  Neck:     Thyroid: No thyromegaly.     Vascular: No JVD.     Trachea: No tracheal deviation.  Cardiovascular:     Rate and  Rhythm: Normal rate and regular rhythm.     Heart sounds: Normal heart sounds.  Pulmonary:     Effort: No respiratory distress.     Breath sounds: No stridor. No wheezing.  Abdominal:     General: Bowel sounds are normal. There is no distension.     Palpations: Abdomen is soft. There is no mass.     Tenderness: There is no abdominal tenderness. There is no guarding or rebound.  Musculoskeletal:        General: No tenderness.     Cervical back: Normal range of motion and neck supple. No rigidity.  Lymphadenopathy:     Cervical: No cervical adenopathy.  Skin:    Findings: No erythema or rash.  Neurological:     Mental Status: She is oriented to person, place, and time.     Cranial Nerves: No cranial nerve deficit.     Motor: No abnormal muscle tone.     Coordination: Coordination normal.     Deep Tendon Reflexes: Reflexes normal.  Psychiatric:        Behavior: Behavior normal.        Thought Content: Thought content normal.        Judgment: Judgment normal.     Lab Results  Component Value Date   WBC 7.5 07/29/2022   HGB 14.9 07/29/2022   HCT 44.4 07/29/2022   PLT 305.0 07/29/2022   GLUCOSE 95 07/29/2022   CHOL 230 (H) 07/29/2022   TRIG 56.0 07/29/2022   HDL 74.90 07/29/2022   LDLDIRECT 118.2 07/19/2013   LDLCALC 144 (H) 07/29/2022   ALT 33 07/29/2022   AST 26 07/29/2022   NA 138 07/29/2022   K 4.2 07/29/2022   CL 100 07/29/2022   CREATININE 0.74 07/29/2022   BUN 19 07/29/2022   CO2 33 (H) 07/29/2022   TSH 1.43 07/29/2022   INR 1.0 12/19/2020    MM 3D SCREEN BREAST BILATERAL  Result Date: 10/26/2022 CLINICAL DATA:  Screening. EXAM: DIGITAL SCREENING BILATERAL MAMMOGRAM WITH TOMOSYNTHESIS AND CAD TECHNIQUE: Bilateral screening digital craniocaudal and mediolateral oblique mammograms were obtained. Bilateral screening digital breast tomosynthesis was performed. The images were evaluated with computer-aided detection. COMPARISON:  Previous exam(s). ACR Breast Density  Category c: The breasts are heterogeneously dense, which may obscure small masses. FINDINGS: There are no findings suspicious for malignancy. IMPRESSION: No mammographic evidence of malignancy. A result letter of this screening mammogram will be mailed directly to the patient. RECOMMENDATION: Screening mammogram in one year. (Code:SM-B-01Y) BI-RADS CATEGORY  1: Negative. Electronically Signed  By: Hulan Saas M.D.   On: 10/26/2022 16:23    Assessment & Plan:   Problem List Items Addressed This Visit     HTN (hypertension) - Primary    On Toprol, Maxzide BP has been good Obtain c-Met and other labs      Relevant Medications   metoprolol succinate (TOPROL-XL) 25 MG 24 hr tablet   triamterene-hydrochlorothiazide (MAXZIDE-25) 37.5-25 MG tablet   Other Relevant Orders   TSH   Urinalysis   CBC with Differential/Platelet   Lipid panel   Comprehensive metabolic panel   Anxiety    On Alprazolam as needed-infrequent use  Potential benefits of a long term benzodiazepines  use as well as potential risks  and complications were explained to the patient and were aknowledged.      Relevant Medications   ALPRAZolam (XANAX) 0.25 MG tablet   Other Relevant Orders   TSH   Urinalysis   CBC with Differential/Platelet   Lipid panel   Comprehensive metabolic panel   Dyslipidemia    Declined statins      Relevant Orders   TSH   Urinalysis   CBC with Differential/Platelet   Lipid panel   Comprehensive metabolic panel   Coronary atherosclerosis    Look at Vit D3, K2, MK7 vitamins      Relevant Medications   metoprolol succinate (TOPROL-XL) 25 MG 24 hr tablet   triamterene-hydrochlorothiazide (MAXZIDE-25) 37.5-25 MG tablet   Other Relevant Orders   TSH   Urinalysis   CBC with Differential/Platelet   Lipid panel   Comprehensive metabolic panel   Adenomatous polyp of ascending colon    H/o large colon polyp - OK bx 18 mm - another colonoscopy is pending in May 2022 Dr.  Meridee Score Las Colon in 2024, due in 2029        Below open [name] is a advised on.  Will go for Cipro does not off nipples or rectal hematoma or blood, normal.  Chest wall pain dysuria musculoskeletal.  Shows a mild cellulitis, advised however she is not able off possible oral show yeah so she is about it.   Meds ordered this encounter  Medications   ALPRAZolam (XANAX) 0.25 MG tablet    Sig: TAKE 1 TABLET BY MOUTH THREE TIMES A DAY AS NEEDED FOR ANXIETY    Dispense:  90 tablet    Refill:  1   metoprolol succinate (TOPROL-XL) 25 MG 24 hr tablet    Sig: Take 1 tablet (25 mg total) by mouth every morning. Additional 25 mg if needed at bedtime    Dispense:  180 tablet    Refill:  3   triamterene-hydrochlorothiazide (MAXZIDE-25) 37.5-25 MG tablet    Sig: Take 1 tablet by mouth daily.    Dispense:  90 tablet    Refill:  3   zolpidem (AMBIEN) 5 MG tablet    Sig: TAKE 1 TABLET BY MOUTH EVERY DAY AT BEDTIME AS NEEDED FOR SLEEP    Dispense:  90 tablet    Refill:  1    This request is for a new prescription for a controlled substance as required by Federal/State law.PATIENT NEEDS REFILLS PLEASE.   meloxicam (MOBIC) 7.5 MG tablet    Sig: Take 1 tablet (7.5 mg total) by mouth daily as needed for pain.    Dispense:  90 tablet    Refill:  0      Follow-up: Return in about 1 year (around 08/09/2024) for a follow-up visit.  Sonda Primes, MD

## 2023-08-10 NOTE — Assessment & Plan Note (Signed)
Look at Vit D3, K2, MK7 vitamins

## 2023-08-10 NOTE — Assessment & Plan Note (Signed)
On Alprazolam as needed-infrequent use  Potential benefits of a long term benzodiazepines  use as well as potential risks  and complications were explained to the patient and were aknowledged.

## 2023-08-10 NOTE — Assessment & Plan Note (Signed)
Declined statins

## 2023-08-10 NOTE — Assessment & Plan Note (Signed)
On Toprol, Maxzide BP has been good Obtain c-Met and other labs

## 2023-08-16 ENCOUNTER — Ambulatory Visit: Payer: Medicare Other | Admitting: Obstetrics and Gynecology

## 2023-08-16 ENCOUNTER — Ambulatory Visit (INDEPENDENT_AMBULATORY_CARE_PROVIDER_SITE_OTHER): Payer: No Typology Code available for payment source | Admitting: Obstetrics and Gynecology

## 2023-08-16 ENCOUNTER — Encounter: Payer: Self-pay | Admitting: Obstetrics and Gynecology

## 2023-08-16 VITALS — BP 132/78 | HR 71

## 2023-08-16 DIAGNOSIS — N83201 Unspecified ovarian cyst, right side: Secondary | ICD-10-CM | POA: Diagnosis not present

## 2023-08-16 NOTE — Progress Notes (Signed)
72 y.o. G1P1 female on HRT (declines progestin) here for [place] discussion. Long standing boyfriend.   Reports tapering estrogen. Last mammogram: 10/22/22  GYN HISTORY: H/O LEEP 2008   OB History  Gravida Para Term Preterm AB Living  1 1       1   SAB IAB Ectopic Multiple Live Births               # Outcome Date GA Lbr Len/2nd Weight Sex Type Anes PTL Lv  1 Para             Obstetric Comments  1 son+ 1 adopted daughter    Past Medical History:  Diagnosis Date   Anxiety    Cataract    Diverticulitis    FRACTURE, ARM, LEFT ~ 1960   FRACTURE, FOOT ~ 1960   FRACTURE, INDEX FINGER, LEFT ~ 1960   Heart murmur    patient denies this dx on 01/28/21   HYPERTENSION 11/15/2007   INSOMNIA, CHRONIC 06/17/2009   MITRAL VALVE PROLAPSE, HX OF    OSTEOARTHRITIS 11/15/2007   Subdural hematoma (HCC) 03/2015   S/P fall    Past Surgical History:  Procedure Laterality Date   CATARACT EXTRACTION W/ INTRAOCULAR LENS IMPLANT Left 01/2015   COLON RESECTION N/A 07/28/2016   Procedure: LAPAROSCOPIC ASSISTED  COLOSTOMY TAKEDOWN WITH COLORECTAL ANASTOMOSIS;  Surgeon: Rodman Pickle, MD;  Location: [facility] OR;  Service: General;  Laterality: N/A;   COLON SURGERY     COLONOSCOPY     COLONOSCOPY WITH PROPOFOL N/A 01/29/2021   Procedure: COLONOSCOPY WITH PROPOFOL;  Surgeon: Lemar Lofty., MD;  Location: [facility] ENDOSCOPY;  Service: Gastroenterology;  Laterality: N/A;   COLONOSCOPY WITH PROPOFOL N/A 10/13/2022   Procedure: COLONOSCOPY WITH PROPOFOL;  Surgeon: Meridee Score Netty Starring., MD;  Location: [facility] ENDOSCOPY;  Service: Gastroenterology;  Laterality: N/A;   COLOSTOMY TAKEDOWN  07/28/2016   laparoscopic converted to open take down of end colostomy with colo-rectostomy/notes 07/28/2016   ENDOSCOPIC MUCOSAL RESECTION N/A 01/29/2021   Procedure: ENDOSCOPIC MUCOSAL RESECTION;  Surgeon: Lemar Lofty., MD;  Location: [facility] ENDOSCOPY;  Service: Gastroenterology;  Laterality: N/A;   EYE SURGERY      HEMOSTASIS CLIP PLACEMENT  01/29/2021   Procedure: HEMOSTASIS CLIP PLACEMENT;  Surgeon: Lemar Lofty., MD;  Location: [facility] ENDOSCOPY;  Service: Gastroenterology;;   PARTIAL COLECTOMY N/A 02/11/2016   Procedure: PARTIAL COLECTOMY with colostomy;  Surgeon: Rodman Pickle, MD;  Location: [facility] OR;  Service: General;  Laterality: N/A;   POLYPECTOMY  01/29/2021   Procedure: POLYPECTOMY;  Surgeon: Lemar Lofty., MD;  Location: [facility] ENDOSCOPY;  Service: Gastroenterology;;   POLYPECTOMY  10/13/2022   Procedure: POLYPECTOMY;  Surgeon: Lemar Lofty., MD;  Location: [facility] ENDOSCOPY;  Service: Gastroenterology;;   SUBMUCOSAL LIFTING INJECTION  01/29/2021   Procedure: SUBMUCOSAL LIFTING INJECTION;  Surgeon: Lemar Lofty., MD;  Location: [facility] ENDOSCOPY;  Service: Gastroenterology;;   TONSILLECTOMY      Current Outpatient Medications on File Prior to Visit  Medication Sig Dispense Refill   ALPRAZolam (XANAX) 0.25 MG tablet TAKE 1 TABLET BY MOUTH THREE TIMES A DAY AS NEEDED FOR ANXIETY 90 tablet 1   clobetasol ointment (TEMOVATE) 0.05 % Apply 1 application  topically daily as needed (Rash).     Dapsone 5 % topical gel SMARTSIG:1 Sparingly Topical Daily     erythromycin base (E-MYCIN) 500 MG tablet SMARTSIG:4 Tablet(s) By Mouth     Esterified Estrogens 0.3 MG tablet Take 0.5 tablets (0.15 mg total) by  mouth daily. 45 tablet 4   ESTRACE VAGINAL 0.1 MG/GM vaginal cream Place 0.25 Applicatorfuls vaginally once a week. 42.5 g 2   FINACEA 15 % gel Apply 1 application  topically daily as needed (Rosacea).     meloxicam (MOBIC) 7.5 MG tablet Take 1 tablet (7.5 mg total) by mouth daily as needed for pain. 90 tablet 0   metoprolol succinate (TOPROL-XL) 25 MG 24 hr tablet Take 1 tablet (25 mg total) by mouth every morning. Additional 25 mg if needed at bedtime 180 tablet 3   Multiple Vitamin (MULTIVITAMIN) capsule Take 1 capsule by mouth daily.     Multiple Vitamins-Minerals (HAIR SKIN &  NAILS) TABS Take 1 tablet by mouth daily. GNC     OVER THE COUNTER MEDICATION Take 1 tablet by mouth 2 (two) times daily. Triflex     OVER THE COUNTER MEDICATION Take 2 tablets by mouth daily. Hydroeye     OVER THE COUNTER MEDICATION Take 1 tablet by mouth daily. Calcimate plus     potassium chloride (KLOR-CON) 8 MEQ tablet Take 1 tablet (8 mEq total) by mouth daily. 90 tablet 3   triamterene-hydrochlorothiazide (MAXZIDE-25) 37.5-25 MG tablet Take 1 tablet by mouth daily. 90 tablet 3   XIIDRA 5 % SOLN Place 1 drop into both eyes 2 (two) times daily.  2   zolpidem (AMBIEN) 5 MG tablet TAKE 1 TABLET BY MOUTH EVERY DAY AT BEDTIME AS NEEDED FOR SLEEP 90 tablet 1   No current facility-administered medications on file prior to visit.    Allergies  Allergen Reactions   Clindamycin Anaphylaxis   Aspirin     bruising   Fish Oil     bruising   Latex     REACTION: sores in mouth   Penicillins Hives    Has patient had a PCN reaction causing immediate rash, facial/tongue/throat swelling, SOB or lightheadedness with hypotension: Yes Has patient had a PCN reaction causing severe rash involving mucus membranes or skin necrosis: No Has patient had a PCN reaction that required hospitalization No Has patient had a PCN reaction occurring within the last 10 years: No If all of the above answers are "NO", then may proceed with Cephalosporin use.       PE Today's Vitals   08/16/23 1333  BP: 132/78  Pulse: 71  SpO2: 99%   There is no height or weight on file to calculate BMI.  Physical Exam Vitals reviewed.  Constitutional:      General: She is not in acute distress.    Appearance: Normal appearance.  HENT:     Head: Normocephalic and atraumatic.     Nose: Nose normal.  Eyes:     Extraocular Movements: Extraocular movements intact.     Conjunctiva/sclera: Conjunctivae normal.  Pulmonary:     Effort: Pulmonary effort is normal.  Musculoskeletal:        General: Normal range of motion.      Cervical back: Normal range of motion.  Neurological:     General: No focal deficit present.     Mental Status: She is alert.  Psychiatric:        Mood and Affect: Mood normal.        Behavior: Behavior normal.     08/16/23 TVUS:  Indications: Unopposed estrogen exposure  Findings:   Uterus: 4.9 x 4.1 x 3.3 cm, retroverted.  3 intramural fibroids noted, the largest measuring 1.6 cm. Endometrial thickness: 1.4 mm. Left ovary: 1.8 x 1.5 x 1.5 cm, normal-appearing  with small follicle. Right ovary: 5.8 x 5.2 x 3.8 cm, with a 5.7 cm simple cyst with a single thin septation. No free fluid in pelvis.  Impression:  Simple right ovarian cyst, ORADS-2.  Rosalyn Gess, MD    Assessment and Plan:        Right ovarian cyst -     CA 125; Future -     US PELVIS TRANSVAGINAL NON-OB (TV ONLY); Future  TVUS read and reviewed with patient.  Reviewed likely benign cyst, however atypical after menopause. Will check tumor marker and repeat US in 6 months All questions answered.   Rosalyn Gess, MD

## 2023-08-17 ENCOUNTER — Other Ambulatory Visit (INDEPENDENT_AMBULATORY_CARE_PROVIDER_SITE_OTHER): Payer: Medicare Other

## 2023-08-17 DIAGNOSIS — I2583 Coronary atherosclerosis due to lipid rich plaque: Secondary | ICD-10-CM

## 2023-08-17 DIAGNOSIS — N83201 Unspecified ovarian cyst, right side: Secondary | ICD-10-CM | POA: Diagnosis not present

## 2023-08-17 DIAGNOSIS — I10 Essential (primary) hypertension: Secondary | ICD-10-CM

## 2023-08-17 DIAGNOSIS — F419 Anxiety disorder, unspecified: Secondary | ICD-10-CM | POA: Diagnosis not present

## 2023-08-17 DIAGNOSIS — E785 Hyperlipidemia, unspecified: Secondary | ICD-10-CM

## 2023-08-17 LAB — COMPREHENSIVE METABOLIC PANEL
ALT: 25 U/L (ref 0–35)
AST: 22 U/L (ref 0–37)
Albumin: 4.4 g/dL (ref 3.5–5.2)
Alkaline Phosphatase: 59 U/L (ref 39–117)
BUN: 22 mg/dL (ref 6–23)
CO2: 31 meq/L (ref 19–32)
Calcium: 9.3 mg/dL (ref 8.4–10.5)
Chloride: 101 meq/L (ref 96–112)
Creatinine, Ser: 0.71 mg/dL (ref 0.40–1.20)
GFR: 83.64 mL/min (ref >60.00)
Glucose, Bld: 97 mg/dL (ref 70–99)
Potassium: 3.7 meq/L (ref 3.5–5.1)
Sodium: 139 meq/L (ref 135–145)
Total Bilirubin: 0.4 mg/dL (ref 0.2–1.2)
Total Protein: 7.2 g/dL (ref 6.0–8.3)

## 2023-08-17 LAB — CBC WITH DIFFERENTIAL/PLATELET
Basophils Absolute: 0 10*3/uL (ref 0.0–0.1)
Basophils Relative: 0.5 % (ref 0.0–3.0)
Eosinophils Absolute: 0.2 10*3/uL (ref 0.0–0.7)
Eosinophils Relative: 3.2 % (ref 0.0–5.0)
HCT: 42.7 % (ref 36.0–46.0)
Hemoglobin: 14 g/dL (ref 12.0–15.0)
Lymphocytes Relative: 37.8 % (ref 12.0–46.0)
Lymphs Abs: 2 10*3/uL (ref 0.7–4.0)
MCHC: 32.8 g/dL (ref 30.0–36.0)
MCV: 93 fL (ref 78.0–100.0)
Monocytes Absolute: 0.5 10*3/uL (ref 0.1–1.0)
Monocytes Relative: 10 % (ref 3.0–12.0)
Neutro Abs: 2.6 10*3/uL (ref 1.4–7.7)
Neutrophils Relative %: 48.5 % (ref 43.0–77.0)
Platelets: 286 10*3/uL (ref 150.0–400.0)
RBC: 4.59 Mil/uL (ref 3.87–5.11)
RDW: 13.9 % (ref 11.5–15.5)
WBC: 5.4 10*3/uL (ref 4.0–10.5)

## 2023-08-17 LAB — URINALYSIS
Bilirubin Urine: NEGATIVE
Hgb urine dipstick: NEGATIVE
Ketones, ur: NEGATIVE
Leukocytes,Ua: NEGATIVE
Nitrite: NEGATIVE
Specific Gravity, Urine: 1.015 (ref 1.000–1.030)
Total Protein, Urine: NEGATIVE
Urine Glucose: NEGATIVE
Urobilinogen, UA: 0.2 (ref 0.0–1.0)
pH: 7.5 (ref 5.0–8.0)

## 2023-08-17 LAB — LIPID PANEL
Cholesterol: 236 mg/dL — ABNORMAL HIGH (ref 0–200)
HDL: 69.4 mg/dL (ref >39.00)
LDL Cholesterol: 154 mg/dL — ABNORMAL HIGH (ref 0–99)
NonHDL: 166.88
Total CHOL/HDL Ratio: 3
Triglycerides: 64 mg/dL (ref 0.0–149.0)
VLDL: 12.8 mg/dL (ref 0.0–40.0)

## 2023-08-17 LAB — TSH: TSH: 2.26 u[IU]/mL (ref 0.35–5.50)

## 2023-08-18 LAB — CA 125: CA 125: 9 U/mL (ref <35)

## 2023-09-09 ENCOUNTER — Other Ambulatory Visit: Payer: Self-pay | Admitting: Internal Medicine

## 2023-09-09 DIAGNOSIS — Z1231 Encounter for screening mammogram for malignant neoplasm of breast: Secondary | ICD-10-CM

## 2023-09-15 ENCOUNTER — Other Ambulatory Visit: Payer: Self-pay | Admitting: Internal Medicine

## 2023-09-26 ENCOUNTER — Ambulatory Visit: Payer: Medicare Other | Admitting: Gastroenterology

## 2023-09-26 ENCOUNTER — Encounter: Payer: Self-pay | Admitting: Gastroenterology

## 2023-09-26 VITALS — BP 118/70 | HR 64 | Ht 63.5 in | Wt 138.0 lb

## 2023-09-26 DIAGNOSIS — K6289 Other specified diseases of anus and rectum: Secondary | ICD-10-CM | POA: Diagnosis not present

## 2023-09-26 DIAGNOSIS — Z860101 Personal history of adenomatous and serrated colon polyps: Secondary | ICD-10-CM

## 2023-09-26 DIAGNOSIS — K644 Residual hemorrhoidal skin tags: Secondary | ICD-10-CM

## 2023-09-26 DIAGNOSIS — K640 First degree hemorrhoids: Secondary | ICD-10-CM | POA: Diagnosis not present

## 2023-09-26 DIAGNOSIS — K602 Anal fissure, unspecified: Secondary | ICD-10-CM

## 2023-09-26 MED ORDER — AMBULATORY NON FORMULARY MEDICATION: 0 refills | Status: AC

## 2023-09-26 NOTE — Progress Notes (Signed)
 Sue BRANIFF    [id number]    05-Oct-1950  Primary Care Physician:Plotnikov, Karlynn GAILS, MD  Referring Physician: Garald Karlynn GAILS, MD [address] [place],  [place] [address]   Chief complaint: Hemorrhoids  Discussed the use of AI scribe software for clinical note transcription with the patient, who gave verbal consent to proceed.  History of Present Illness   The patient, with a history of hemorrhoids and precancerous polyps, presents for a recheck of her anal health. She reports no recent rectal bleeding, but notes occasional swelling and a sensation of a nodule in the anal area. The swelling is described as variable, sometimes feeling larger than at other times, but overall, the patient reports an improvement from previous symptoms. She denies any rectal pain or significant changes in bowel habits, maintaining a regular daily bowel movement pattern.  The patient also has a history of colonoscopies due to precancerous polyps. The most recent colonoscopy was performed in January 2024, during which multiple polyps were removed, one of which was a tubular adenoma, a precancerous but still benign growth. The patient is not due for another colonoscopy until 2027.  The patient also reports a small tear in the anal sphincter, which she describes as occasionally causing a burning sensation. She has been using estradiol  vaginal ointment to keep the skin moist and healthy. The patient has been advised to keep the perianal area as dry as possible to promote healing of the tear.          Outpatient Encounter Medications as of 09/26/2023  Medication Sig   ALPRAZolam  (XANAX ) 0.25 MG tablet TAKE 1 TABLET BY MOUTH THREE TIMES A DAY AS NEEDED FOR ANXIETY   AMBULATORY NON FORMULARY MEDICATION Medication Name: Diltiazem gel 2 % apply pea sized amount to your rectum twice a day for 4 weeks   clobetasol  ointment (TEMOVATE ) 0.05 % Apply 1 application  topically daily as needed  (Rash).   Dapsone 5 % topical gel SMARTSIG:1 Sparingly Topical Daily   erythromycin base (E-MYCIN) 500 MG tablet SMARTSIG:4 Tablet(s) By Mouth   Esterified Estrogens  0.3 MG tablet Take 0.5 tablets (0.15 mg total) by mouth daily.   ESTRACE  VAGINAL 0.1 MG/GM vaginal cream Place 0.25 Applicatorfuls vaginally once a week.   FINACEA 15 % gel Apply 1 application  topically daily as needed (Rosacea).   meloxicam  (MOBIC ) 7.5 MG tablet Take 1 tablet (7.5 mg total) by mouth daily as needed for pain.   metoprolol  succinate (TOPROL -XL) 25 MG 24 hr tablet Take 1 tablet (25 mg total) by mouth every morning. Additional 25 mg if needed at bedtime   Multiple Vitamin (MULTIVITAMIN) capsule Take 1 capsule by mouth daily.   Multiple Vitamins-Minerals (HAIR SKIN & NAILS) TABS Take 1 tablet by mouth daily. GNC   OVER THE COUNTER MEDICATION Take 1 tablet by mouth 2 (two) times daily. Triflex   OVER THE COUNTER MEDICATION Take 2 tablets by mouth daily. Hydroeye   OVER THE COUNTER MEDICATION Take 1 tablet by mouth daily. Calcimate plus   potassium chloride  (KLOR-CON ) 8 MEQ tablet TAKE 1 TABLET BY MOUTH EVERY DAY   triamterene -hydrochlorothiazide  (MAXZIDE -25) 37.5-25 MG tablet Take 1 tablet by mouth daily.   XIIDRA  5 % SOLN Place 1 drop into both eyes 2 (two) times daily.   zolpidem  (AMBIEN ) 5 MG tablet TAKE 1 TABLET BY MOUTH EVERY DAY AT BEDTIME AS NEEDED FOR SLEEP   No facility-administered encounter medications on file as  of 09/26/2023.    Allergies as of 09/26/2023 - Review Complete 09/26/2023  Allergen Reaction Noted   Clindamycin Anaphylaxis 08/22/2015   Aspirin  10/30/2019   Fish oil  10/30/2019   Latex  01/28/2009   Penicillins Hives     Past Medical History:  Diagnosis Date   Anxiety    Cataract    Diverticulitis    FRACTURE, ARM, LEFT ~ 1960   FRACTURE, FOOT ~ 1960   FRACTURE, INDEX FINGER, LEFT ~ 1960   Heart murmur    patient denies this dx on 01/28/21   HYPERTENSION 11/15/2007   INSOMNIA,  CHRONIC 06/17/2009   MITRAL VALVE PROLAPSE, HX OF    OSTEOARTHRITIS 11/15/2007   Subdural hematoma (HCC) 03/2015   S/P fall    Past Surgical History:  Procedure Laterality Date   CATARACT EXTRACTION W/ INTRAOCULAR LENS IMPLANT Left 01/2015   COLON RESECTION N/A 07/28/2016   Procedure: LAPAROSCOPIC ASSISTED  COLOSTOMY TAKEDOWN WITH COLORECTAL ANASTOMOSIS;  Surgeon: Herlene Beverley Bureau, MD;  Location: [facility] OR;  Service: General;  Laterality: N/A;   COLON SURGERY     COLONOSCOPY     COLONOSCOPY WITH PROPOFOL  N/A 01/29/2021   Procedure: COLONOSCOPY WITH PROPOFOL ;  Surgeon: Wilhelmenia Aloha Raddle., MD;  Location: [facility] ENDOSCOPY;  Service: Gastroenterology;  Laterality: N/A;   COLONOSCOPY WITH PROPOFOL  N/A 10/13/2022   Procedure: COLONOSCOPY WITH PROPOFOL ;  Surgeon: Mansouraty, Aloha Raddle., MD;  Location: [facility] ENDOSCOPY;  Service: Gastroenterology;  Laterality: N/A;   COLOSTOMY TAKEDOWN  07/28/2016   laparoscopic converted to open take down of end colostomy with colo-rectostomy/notes 07/28/2016   ENDOSCOPIC MUCOSAL RESECTION N/A 01/29/2021   Procedure: ENDOSCOPIC MUCOSAL RESECTION;  Surgeon: Wilhelmenia Aloha Raddle., MD;  Location: [facility] ENDOSCOPY;  Service: Gastroenterology;  Laterality: N/A;   EYE SURGERY     HEMOSTASIS CLIP PLACEMENT  01/29/2021   Procedure: HEMOSTASIS CLIP PLACEMENT;  Surgeon: Wilhelmenia Aloha Raddle., MD;  Location: [facility] ENDOSCOPY;  Service: Gastroenterology;;   PARTIAL COLECTOMY N/A 02/11/2016   Procedure: PARTIAL COLECTOMY with colostomy;  Surgeon: Herlene Beverley Bureau, MD;  Location: [facility] OR;  Service: General;  Laterality: N/A;   POLYPECTOMY  01/29/2021   Procedure: POLYPECTOMY;  Surgeon: Wilhelmenia Aloha Raddle., MD;  Location: [facility] ENDOSCOPY;  Service: Gastroenterology;;   POLYPECTOMY  10/13/2022   Procedure: POLYPECTOMY;  Surgeon: Wilhelmenia Aloha Raddle., MD;  Location: [facility] ENDOSCOPY;  Service: Gastroenterology;;   SUBMUCOSAL LIFTING INJECTION  01/29/2021   Procedure: SUBMUCOSAL LIFTING  INJECTION;  Surgeon: Wilhelmenia Aloha Raddle., MD;  Location: [facility] ENDOSCOPY;  Service: Gastroenterology;;   TONSILLECTOMY      Family History  Problem Relation Age of Onset   Breast cancer Mother    Hypertension Father    Cancer Father        LUNG   Parkinsonism Other    Colon cancer Neg Hx    Esophageal cancer Neg Hx    Colon polyps Neg Hx    Stomach cancer Neg Hx    Inflammatory bowel disease Neg Hx    Liver disease Neg Hx    Pancreatic cancer Neg Hx    Rectal cancer Neg Hx     Social History   Socioeconomic History   Marital status: Divorced    Spouse name: Not on file   Number of children: 2   Years of education: Not on file   Highest education level: Bachelor's degree (e.g., BA, AB, BS)  Occupational History   Occupation: Home maker  Tobacco Use   Smoking status: Never   Smokeless  tobacco: Never  Vaping Use   Vaping status: Never Used  Substance and Sexual Activity   Alcohol use: Not Currently    Comment: occasional wine   Drug use: No   Sexual activity: Not Currently    Partners: Male    Birth control/protection: Post-menopausal    Comment: older than 14 sexual debut, more than 5 lifetime partners  Other Topics Concern   Not on file  Social History Narrative   [employer], [place]    Married '72-17 yrs then divorced; remarried '90 - 4 yrs divorced. 1 boy - '75; 1 daughter '83. Single partner since 2001. Socially active, riding horses not hunting but jumping little jumps. Lost her mother Spring '12 - doing OK with loss.   Social Drivers of Corporate Investment Banker Strain: Low Risk  (08/06/2023)   Overall Financial Resource Strain (CARDIA)    Difficulty of Paying Living Expenses: Not hard at all  Food Insecurity: No Food Insecurity (08/06/2023)   Hunger Vital Sign    Worried About Running Out of Food in the Last Year: Never true    Ran Out of Food in the Last Year: Never true  Transportation Needs: No Transportation Needs (08/06/2023)   PRAPARE -  Administrator, Civil Service (Medical): No    Lack of Transportation (Non-Medical): No  Physical Activity: Sufficiently Active (08/06/2023)   Exercise Vital Sign    Days of Exercise per Week: 5 days    Minutes of Exercise per Session: 50 min  Stress: No Stress Concern Present (08/06/2023)   [employer] of Occupational Health - Occupational Stress Questionnaire    Feeling of Stress : Not at all  Social Connections: Moderately Integrated (08/06/2023)   Social Connection and Isolation Panel [NHANES]    Frequency of Communication with Friends and Family: Three times a week    Frequency of Social Gatherings with Friends and Family: Twice a week    Attends Religious Services: More than 4 times per year    Active Member of [employer] or Organizations: Yes    Attends Engineer, Structural: More than 4 times per year    Marital Status: Divorced  Intimate Partner Violence: Not At Risk (12/13/2022)   Humiliation, Afraid, Rape, and Kick questionnaire    Fear of Current or Ex-Partner: No    Emotionally Abused: No    Physically Abused: No    Sexually Abused: No      Review of systems: All other review of systems negative except as mentioned in the HPI.   Physical Exam: Vitals:   09/26/23 1417  BP: 118/70  Pulse: 64   Body mass index is 24.06 kg/m. Gen:      No acute distress HEENT:  sclera anicteric Abd:      soft, non-tender; no palpable masses, no distension Ext:    No edema Neuro: alert and oriented x 3 Psych: normal mood and affect Anoscopy: External skin tags, small anal fissure, grade I internal hemorrhoids  Data Reviewed:  Reviewed labs, radiology imaging, old records and pertinent past GI work up     Assessment and Plan    Anal Nodule and Hemorrhoids Patient reports intermittent swelling and discomfort, but no bleeding. On examination, a benign-appearing nodule was noted on the left side, along with skin tags. -Monitor nodule for any changes in  size or symptoms. -If changes occur, consider biopsy.  Anal Fissure Small tear noted in the anal sphincter, likely due to straining or constipation. -Prescribe Diltiazem cream  2%, apply small pea-sized amount per rectum twice daily for 4-6 weeks using a Q-tip. -Advise patient to keep the area dry and pat dry after using moist wipes.  Colon Polyps History of precancerous polyps removed during previous colonoscopies. Last colonoscopy in January 2024 was unremarkable. -Next colonoscopy due in 2027. -Reminder set for annual check.      The patient was provided an opportunity to ask questions and all were answered. The patient agreed with the plan and demonstrated an understanding of the instructions.  LOIS Wilkie Mcgee , MD    CC: Plotnikov, Karlynn GAILS, MD

## 2023-09-26 NOTE — Patient Instructions (Signed)
 Your provider has prescribed Diltizem gel  gel for you. Please follow the directions written on your prescription bottle or given to you specifically by your provider. Since this is a specialty medication and is not readily available at most local pharmacies, we have sent your prescription to:  [facility] information is below: Address: [address] [place], [place] [address]  [phone]  *Please DO NOT go directly from our office to pick up this medication! Give the pharmacy 1 day to process the prescription as this is compounded and takes time to make.   Anal Fissure, Adult  An anal fissure is a small tear or crack in the tissue near the opening of the butt (anus). In most cases, bleeding from the tear or crack stops on its own within a few minutes. You may have bleeding each time you poop until the tear or crack heals. What are the causes? Passing a large or hard poop (stool). Having trouble pooping (constipation). Having watery poops (diarrhea). An inflammatory bowel disease, like Crohn's disease or ulcerative colitis. Childbirth. Infections. Anal sex. What are the signs or symptoms? Bleeding from the butt. Small amounts of blood on your poop. The blood coats the outside of the poop. It is not mixed with the poop. Small amounts of blood on the toilet paper or in the toilet after you poop. Pain when you poop. Itching or irritation around your butt. How is this treated? Treatment may include: Making changes to what you eat and drink. This can help if you have trouble pooping. Taking fiber supplements. These can help make your poop soft. Taking warm water baths (sitz baths). These can help heal the tear. Using creams and ointments. If other treatments do not work, you may need: A shot near the tear or crack (botulinum injection). Surgery to fix the tear or crack. Follow these instructions at home: Medicines Take over-the-counter and prescription medicines  only as told by your doctor. This includes creams and ointments that have medicine in them. Use medicines to make your poop soft as told by your doctor. Treating constipation You may need to take these actions to prevent or treat trouble pooping: Drink enough fluid to keep your pee (urine) pale yellow. Eat foods that are high in fiber. These include beans, whole grains, and fresh fruits and vegetables. Stay away from unripe bananas. Ripe bananas are a good choice. Limit foods that are high in fat and sugar. These include fried or sweet foods. Avoid dairy products. This includes milk.  General instructions  Keep the butt area clean and dry. Take a warm water bath as told by your doctor. Do not use soap. Contact a doctor if: You have more bleeding. You have a fever. You have watery poop that is mixed with blood. Your pain does not go away. Your problems get worse. This information is not intended to replace advice given to you by your health care provider. Make sure you discuss any questions you have with your health care provider. Document Revised: 09/16/2022 Document Reviewed: 09/16/2022 Elsevier Patient Education  2024 [employer].   I appreciate the  opportunity to care for you  Thank You   Kavitha Nandigam , MD

## 2023-09-29 ENCOUNTER — Encounter: Payer: Self-pay | Admitting: Gastroenterology

## 2023-10-05 DIAGNOSIS — M25561 Pain in right knee: Secondary | ICD-10-CM | POA: Diagnosis not present

## 2023-10-24 ENCOUNTER — Ambulatory Visit
Admission: RE | Admit: 2023-10-24 | Discharge: 2023-10-24 | Disposition: A | Discharge status: Home / Self Care | Payer: Medicare Other | Source: Ambulatory Visit | Attending: Internal Medicine | Admitting: Internal Medicine

## 2023-10-24 DIAGNOSIS — Z1231 Encounter for screening mammogram for malignant neoplasm of breast: Secondary | ICD-10-CM | POA: Diagnosis not present

## 2023-11-07 ENCOUNTER — Other Ambulatory Visit: Payer: Self-pay | Admitting: Internal Medicine

## 2023-11-28 DIAGNOSIS — M9903 Segmental and somatic dysfunction of lumbar region: Secondary | ICD-10-CM | POA: Diagnosis not present

## 2023-12-06 DIAGNOSIS — M9903 Segmental and somatic dysfunction of lumbar region: Secondary | ICD-10-CM | POA: Diagnosis not present

## 2023-12-12 DIAGNOSIS — M9903 Segmental and somatic dysfunction of lumbar region: Secondary | ICD-10-CM | POA: Diagnosis not present

## 2023-12-14 ENCOUNTER — Ambulatory Visit: Payer: Medicare Other

## 2023-12-14 VITALS — Ht 64.0 in | Wt 131.0 lb

## 2023-12-14 DIAGNOSIS — R195 Other fecal abnormalities: Secondary | ICD-10-CM | POA: Diagnosis not present

## 2023-12-14 DIAGNOSIS — Z0000 Encounter for general adult medical examination without abnormal findings: Secondary | ICD-10-CM

## 2023-12-14 NOTE — Progress Notes (Addendum)
 Subjective:   Sue [name] is a 73 y.o. who presents for a Medicare Wellness preventive visit.  Visit Complete: Virtual I connected with  [name] on 12/14/23 by a audio enabled telemedicine application and verified that I am speaking with the correct person using two identifiers.  Patient Location: Home  Provider Location: Office/Clinic  I discussed the limitations of evaluation and management by telemedicine. The patient expressed understanding and agreed to proceed.  Vital Signs: Because this visit was a virtual/telehealth visit, some criteria may be missing or patient reported. Any vitals not documented were not able to be obtained and vitals that have been documented are patient reported.  VideoDeclined- This patient declined Librarian, academic. Therefore the visit was completed with audio only.  Persons Participating in Visit: Patient.  AWV Questionnaire: Yes: Patient Medicare AWV questionnaire was completed by the patient on 12/10/2023; I have confirmed that all information answered by patient is correct and no changes since this date.  Cardiac Risk Factors include: advanced age (>52men, >64 women);dyslipidemia;hypertension     Objective:    Today's Vitals   12/14/23 0856  Weight: 131 lb (59.4 kg)  Height: 5\' 4"  (1.626 m)   Body mass index is 22.49 kg/m.     12/14/2023    8:55 AM 12/13/2022    8:52 AM 10/13/2022    9:06 AM 12/11/2021    2:08 PM 01/29/2021    9:15 AM 11/19/2020    3:18 PM 07/31/2019    3:43 PM  Advanced Directives  Does Patient Have a Medical Advance Directive? Yes Yes No Yes Yes Yes Yes  Type of Estate agent of [place];Living will Healthcare Power of [place];Living will  Healthcare Power of [place];Living will Healthcare Power of Attorney Living will;Healthcare Power of Attorney Living will;Healthcare Power of Attorney  Does patient want to make changes to medical advance directive?       No - Patient declined No - Patient declined  Copy of Healthcare Power of Attorney in Chart? No - copy requested No - copy requested  No - copy requested Yes - validated most recent copy scanned in chart (See row information) No - copy requested   Would patient like information on creating a medical advance directive?   No - Patient declined        Current Medications (verified) Outpatient Encounter Medications as of 12/14/2023  Medication Sig   ALPRAZolam (XANAX) 0.25 MG tablet TAKE 1 TABLET BY MOUTH THREE TIMES A DAY AS NEEDED FOR ANXIETY   AMBULATORY NON FORMULARY MEDICATION Medication Name: Diltiazem gel 2 % apply pea sized amount to your rectum twice a day for 4 weeks   clobetasol ointment (TEMOVATE) 0.05 % Apply 1 application  topically daily as needed (Rash).   Dapsone 5 % topical gel SMARTSIG:1 Sparingly Topical Daily   erythromycin base (E-MYCIN) 500 MG tablet SMARTSIG:4 Tablet(s) By Mouth   Esterified Estrogens 0.3 MG tablet Take 0.5 tablets (0.15 mg total) by mouth daily.   ESTRACE VAGINAL 0.1 MG/GM vaginal cream Place 0.25 Applicatorfuls vaginally once a week.   FINACEA 15 % gel Apply 1 application  topically daily as needed (Rosacea).   meloxicam (MOBIC) 7.5 MG tablet Take 1 tablet (7.5 mg total) by mouth daily as needed for pain.   metoprolol succinate (TOPROL-XL) 25 MG 24 hr tablet Take 1 tablet (25 mg total) by mouth every morning. Additional 25 mg if needed at bedtime   Multiple Vitamin (MULTIVITAMIN) capsule Take 1 capsule  by mouth daily.   Multiple Vitamins-Minerals (HAIR SKIN & NAILS) TABS Take 1 tablet by mouth daily. GNC   OVER THE COUNTER MEDICATION Take 1 tablet by mouth 2 (two) times daily. Triflex   OVER THE COUNTER MEDICATION Take 2 tablets by mouth daily. Hydroeye   OVER THE COUNTER MEDICATION Take 1 tablet by mouth daily. Calcimate plus   potassium chloride (KLOR-CON) 8 MEQ tablet TAKE 1 TABLET BY MOUTH EVERY DAY   triamterene-hydrochlorothiazide (MAXZIDE-25)  37.5-25 MG tablet Take 1 tablet by mouth daily.   XIIDRA 5 % SOLN Place 1 drop into both eyes 2 (two) times daily.   zolpidem (AMBIEN) 5 MG tablet TAKE 1 TABLET BY MOUTH EVERY DAY AT BEDTIME AS NEEDED FOR SLEEP   No facility-administered encounter medications on file as of 12/14/2023.    Allergies (verified) Clindamycin, Aspirin, Fish oil, Latex, and Penicillins   History: Past Medical History:  Diagnosis Date   Anxiety    Cataract    Diverticulitis    FRACTURE, ARM, LEFT ~ 1960   FRACTURE, FOOT ~ 1960   FRACTURE, INDEX FINGER, LEFT ~ 1960   Heart murmur    patient denies this dx on 01/28/21   HYPERTENSION 11/15/2007   INSOMNIA, CHRONIC 06/17/2009   MITRAL VALVE PROLAPSE, HX OF    OSTEOARTHRITIS 11/15/2007   Subdural hematoma (HCC) 03/2015   S/P fall   Past Surgical History:  Procedure Laterality Date   CATARACT EXTRACTION W/ INTRAOCULAR LENS IMPLANT Left 01/2015   COLON RESECTION N/A 07/28/2016   Procedure: LAPAROSCOPIC ASSISTED  COLOSTOMY TAKEDOWN WITH COLORECTAL ANASTOMOSIS;  Surgeon: Rodman Pickle, MD;  Location: [facility] OR;  Service: General;  Laterality: N/A;   COLON SURGERY     COLONOSCOPY     COLONOSCOPY WITH PROPOFOL N/A 01/29/2021   Procedure: COLONOSCOPY WITH PROPOFOL;  Surgeon: Lemar Lofty., MD;  Location[facility] ENDOSCOPY;  Service: Gastroenterology;  Laterality: N/A;   COLONOSCOPY WITH PROPOFOL N/A 10/13/2022   Procedure: COLONOSCOPY WITH PROPOFOL;  Surgeon: Meridee Score Netty Starring., MD;  Location: [facility] ENDOSCOPY;  Service: Gastroenterology;  Laterality: N/A;   COLOSTOMY TAKEDOWN  07/28/2016   laparoscopic converted to open take down of end colostomy with colo-rectostomy/notes 07/28/2016   ENDOSCOPIC MUCOSAL RESECTION N/A 01/29/2021   Procedure: ENDOSCOPIC MUCOSAL RESECTION;  Surgeon: Lemar Lofty., MD;  Location: [facility] ENDOSCOPY;  Service: Gastroenterology;  Laterality: N/A;   EYE SURGERY     HEMOSTASIS CLIP PLACEMENT  01/29/2021   Procedure: HEMOSTASIS  CLIP PLACEMENT;  Surgeon: Lemar Lofty., MD;  Location: [facility] ENDOSCOPY;  Service: Gastroenterology;;   PARTIAL COLECTOMY N/A 02/11/2016   Procedure: PARTIAL COLECTOMY with colostomy;  Surgeon: Rodman Pickle, MD;  Location: [facility] OR;  Service: General;  Laterality: N/A;   POLYPECTOMY  01/29/2021   Procedure: POLYPECTOMY;  Surgeon: Lemar Lofty., MD;  Location: [facility] ENDOSCOPY;  Service: Gastroenterology;;   POLYPECTOMY  10/13/2022   Procedure: POLYPECTOMY;  Surgeon: Lemar Lofty., MD;  Location: [facility] ENDOSCOPY;  Service: Gastroenterology;;   SUBMUCOSAL LIFTING INJECTION  01/29/2021   Procedure: SUBMUCOSAL LIFTING INJECTION;  Surgeon: Lemar Lofty., MD;  Location: [facility] ENDOSCOPY;  Service: Gastroenterology;;   TONSILLECTOMY     Family History  Problem Relation Age of Onset   Breast cancer Mother    Hypertension Father    Cancer Father        LUNG   Parkinsonism Other    Colon cancer Neg Hx    Esophageal cancer Neg Hx    Colon polyps Neg  Hx    Stomach cancer Neg Hx    Inflammatory bowel disease Neg Hx    Liver disease Neg Hx    Pancreatic cancer Neg Hx    Rectal cancer Neg Hx    Social History   Socioeconomic History   Marital status: Divorced    Spouse name: Not on file   Number of children: 2   Years of education: Not on file   Highest education level: Bachelor's degree (e.g., BA, AB, BS)  Occupational History   Occupation: Home maker  Tobacco Use   Smoking status: Never    Passive exposure: Never   Smokeless tobacco: Never  Vaping Use   Vaping status: Never Used  Substance and Sexual Activity   Alcohol use: Not Currently    Comment: occasional wine   Drug use: No   Sexual activity: Not Currently    Partners: Male    Birth control/protection: Post-menopausal    Comment: older than 14 sexual debut, more than 5 lifetime partners  Other Topics Concern   Not on file  Social History Narrative   [place], [place]   Married '72-17  yrs then divorced; remarried '90 - 4 yrs divorced. 1 boy - '75; 1 daughter '83. Single partner since 2001. Socially active, riding horses not hunting but jumping little jumps. Lost her mother Spring '12 - doing OK with loss.   Social Drivers of Corporate investment banker Strain: Low Risk  (12/14/2023)   Overall Financial Resource Strain (CARDIA)    Difficulty of Paying Living Expenses: Not hard at all  Food Insecurity: No Food Insecurity (12/14/2023)   Hunger Vital Sign    Worried About Running Out of Food in the Last Year: Never true    Ran Out of Food in the Last Year: Never true  Transportation Needs: No Transportation Needs (12/14/2023)   PRAPARE - Administrator, Civil Service (Medical): No    Lack of Transportation (Non-Medical): No  Physical Activity: Sufficiently Active (12/14/2023)   Exercise Vital Sign    Days of Exercise per Week: 5 days    Minutes of Exercise per Session: 50 min  Stress: No Stress Concern Present (12/14/2023)   [employer] of Occupational Health - Occupational Stress Questionnaire    Feeling of Stress : Not at all  Social Connections: Moderately Integrated (12/14/2023)   Social Connection and Isolation Panel [NHANES]    Frequency of Communication with Friends and Family: More than three times a week    Frequency of Social Gatherings with Friends and Family: Three times a week    Attends Religious Services: More than 4 times per year    Active Member of Clubs or Organizations: Yes    Attends Engineer, structural: More than 4 times per year    Marital Status: Divorced    Tobacco Counseling Counseling given: No    Clinical Intake:  Pre-visit preparation completed: Yes  Pain : No/denies pain     BMI - recorded: 22.49 Nutritional Status: BMI of 19-24  Normal Nutritional Risks: None Diabetes: No  No results found for: "HGBA1C"   How often do you need to have someone help you when you read instructions, pamphlets, or other  written materials from your doctor or pharmacy?: 1 - Never  Interpreter Needed?: No  Information entered by :: Hassell Halim, CMA   Activities of Daily Living     12/14/2023    8:58 AM 12/10/2023   10:28 AM  In your present  state of health, do you have any difficulty performing the following activities:  Hearing? 0 0  Vision? 0 0  Difficulty concentrating or making decisions? 0 0  Walking or climbing stairs? 0 0  Dressing or bathing? 0 0  Doing errands, shopping? 0 0  Preparing Food and eating ? N N  Using the Toilet? N N  In the past six months, have you accidently leaked urine? N N  Do you have problems with loss of bowel control? N N  Managing your Medications? N N  Managing your Finances? N N  Housekeeping or managing your Housekeeping? N N    Patient Care Team: Plotnikov, Georgina Quint, MD as PCP - General (Internal Medicine) Kinsinger, De Blanch, MD as Consulting Physician (General Surgery) Mansouraty, Netty Starring., MD as Consulting Physician (Gastroenterology) Shon Millet, MD as Consulting Physician (Ophthalmology)  Indicate any recent Medical Services you may have received from other than [facility] providers in the past year (date may be approximate).     Assessment:   This is a routine wellness examination for Seana.  Hearing/Vision screen Hearing Screening - Comments:: Denies hearing difficulties   Vision Screening - Comments:: Wears rx glasses - up to date with routine eye exams with Dr Sherrine Maples   Goals Addressed               This Visit's Progress     Patient Stated (pt-stated)        Patient stated she plans to stay active.         Depression Screen     12/14/2023    9:02 AM 08/10/2023    3:42 PM 12/13/2022    8:55 AM 09/23/2022   11:01 AM 08/02/2022   10:25 AM 12/11/2021    2:17 PM 12/11/2021    2:06 PM  PHQ 2/9 Scores  PHQ - 2 Score 0 0 0 0 0 0 0  PHQ- 9 Score 0          Fall Risk     12/14/2023    8:59 AM 12/10/2023   10:28 AM 08/10/2023     3:42 PM 12/13/2022    8:53 AM 09/23/2022   11:01 AM  Fall Risk   Falls in the past year? 0 0 0 0 0  Number falls in past yr: 0  0 0 0  Injury with Fall? 0  0 0 0  Risk for fall due to : No Fall Risks  No Fall Risks No Fall Risks No Fall Risks  Follow up Falls prevention discussed;Falls evaluation completed  Falls evaluation completed Falls prevention discussed Falls evaluation completed    MEDICARE RISK AT HOME:  Medicare Risk at Home Any stairs in or around the home?: Yes If so, are there any without handrails?: No Home free of loose throw rugs in walkways, pet beds, electrical cords, etc?: Yes Adequate lighting in your home to reduce risk of falls?: Yes Life alert?: No Use of a cane, walker or w/c?: No Grab bars in the bathroom?: No Shower chair or bench in shower?: No Elevated toilet seat or a handicapped toilet?: Yes  TIMED UP AND GO:  Was the test performed?  No  Cognitive Function: 6CIT completed        12/14/2023    9:01 AM 12/13/2022    8:55 AM  6CIT Screen  What Year? 0 points 0 points  What month? 0 points 0 points  What time? 0 points 0 points  Count back from 20 0  points 0 points  Months in reverse 0 points 0 points  Repeat phrase 0 points 0 points  Total Score 0 points 0 points    Immunizations Immunization History  Administered Date(s) Administered   Td 09/15/2006   Tdap 06/16/2019    Screening Tests Health Maintenance  Topic Date Due   Pneumonia Vaccine 67+ Years old (1 of 2 - PCV) Never done   Zoster Vaccines- Shingrix (1 of 2) Never done   Fecal DNA (Cologuard)  08/19/2023   MAMMOGRAM  10/23/2024   Medicare Annual Wellness (AWV)  12/13/2024   DTaP/Tdap/Td (3 - Td or Tdap) 06/15/2029   DEXA SCAN  Completed   Hepatitis C Screening  Completed   HPV VACCINES  Aged Out   INFLUENZA VACCINE  Discontinued   COVID-19 Vaccine  Discontinued    Health Maintenance  Health Maintenance Due  Topic Date Due   Pneumonia Vaccine 64+ Years old (1 of 2 -  PCV) Never done   Zoster Vaccines- Shingrix (1 of 2) Never done   Fecal DNA (Cologuard)  08/19/2023   Health Maintenance Items Addressed: 12/14/2023  Cologuard status: Results were Positive in 2021.  Ordered a Colonoscopy w/Dr. Vicente Serene Mansouraty.  Additional Screening:  Vision Screening: Recommended annual ophthalmology exams for early detection of glaucoma and other disorders of the eye.  Dental Screening: Recommended annual dental exams for proper oral hygiene  Community Resource Referral / Chronic Care Management: CRR required this visit?  No   CCM required this visit?  No     Plan:     I have personally reviewed and noted the following in the patient's chart:   Medical and social history Use of alcohol, tobacco or illicit drugs  Current medications and supplements including opioid prescriptions. Patient is not currently taking opioid prescriptions. Functional ability and status Nutritional status Physical activity Advanced directives List of other physicians Hospitalizations, surgeries, and ER visits in previous 12 months Vitals Screenings to include cognitive, depression, and falls Referrals and appointments  In addition, I have reviewed and discussed with patient certain preventive protocols, quality metrics, and best practice recommendations. A written personalized care plan for preventive services as well as general preventive health recommendations were provided to patient.     Darreld Mclean, CMA   12/14/2023   After Visit Summary: (MyChart) Due to this being a telephonic visit, the after visit summary with patients personalized plan was offered to patient via MyChart   Notes: See Routing Note  Medical screening examination/treatment/procedure(s) were performed by non-physician practitioner and as supervising physician I was immediately available for consultation/collaboration.  I agree with above. Jacinta Shoe, MD

## 2023-12-14 NOTE — Patient Instructions (Addendum)
 Sue [name] , Thank you for taking time to come for your Medicare Wellness Visit. I appreciate your ongoing commitment to your health goals. Please review the following plan we discussed and let me know if I can assist you in the future.   Referrals/Orders/Follow-Ups/Clinician Recommendations: Aim for 30 minutes of exercise or brisk walking, 6-8 glasses of water, and 5 servings of fruits and vegetables each day. Educated and advised on the Pneumonia and Shingles vaccines.  Due for a Colonoscopy due to having an abnormal Cologuard in 2021.  This is a list of the screening recommended for you and due dates:  Health Maintenance  Topic Date Due   Pneumonia Vaccine (1 of 2 - PCV) Never done   Zoster (Shingles) Vaccine (1 of 2) Never done   Cologuard (Stool DNA test)  08/19/2023   Mammogram  10/23/2024   Medicare Annual Wellness Visit  12/13/2024   DTaP/Tdap/Td vaccine (3 - Td or Tdap) 06/15/2029   DEXA scan (bone density measurement)  Completed   Hepatitis C Screening  Completed   HPV Vaccine  Aged Out   Flu Shot  Discontinued   COVID-19 Vaccine  Discontinued    Advanced directives: (Copy Requested) Please bring a copy of your health care power of attorney and living will to the office to be added to your chart at your convenience. You can mail to [facility] [address] [place], [place] [address] or email to [email or web address]  Next Medicare Annual Wellness Visit scheduled for next year: Yes

## 2024-01-12 ENCOUNTER — Telehealth: Payer: Self-pay | Admitting: *Deleted

## 2024-01-12 NOTE — Telephone Encounter (Signed)
 Patient left message on triage line requesting call back. States she is due for imaging for f/u of cyst. Will be out of town end of May-Oct, asking if imaging is necessary or can be delayed until November?

## 2024-01-13 ENCOUNTER — Other Ambulatory Visit: Payer: Self-pay | Admitting: Internal Medicine

## 2024-01-13 NOTE — Telephone Encounter (Signed)
 Pt scheduled for 01/25/2024. Encounter routed to provider for final review and encounter closed.

## 2024-01-13 NOTE — Telephone Encounter (Signed)
 Per GH:  "I would recommend that she come in for sooner appt if available."

## 2024-01-13 NOTE — Telephone Encounter (Signed)
 Per review of EMR, pt to return for f/u PUS in 6 months after appt in 07/2023 for right simple ovarian cyst. CA125 in 08/2023-WNL per [facility].   Pls advise if desire to attempt to make sooner appt before the pt leaves town or if ok to wait.

## 2024-01-18 DIAGNOSIS — M9903 Segmental and somatic dysfunction of lumbar region: Secondary | ICD-10-CM | POA: Diagnosis not present

## 2024-01-24 ENCOUNTER — Telehealth: Payer: Self-pay | Admitting: Internal Medicine

## 2024-01-24 DIAGNOSIS — L812 Freckles: Secondary | ICD-10-CM | POA: Diagnosis not present

## 2024-01-24 DIAGNOSIS — D225 Melanocytic nevi of trunk: Secondary | ICD-10-CM | POA: Diagnosis not present

## 2024-01-24 DIAGNOSIS — L821 Other seborrheic keratosis: Secondary | ICD-10-CM | POA: Diagnosis not present

## 2024-01-24 DIAGNOSIS — Z85828 Personal history of other malignant neoplasm of skin: Secondary | ICD-10-CM | POA: Diagnosis not present

## 2024-01-24 DIAGNOSIS — D1801 Hemangioma of skin and subcutaneous tissue: Secondary | ICD-10-CM | POA: Diagnosis not present

## 2024-01-24 DIAGNOSIS — L308 Other specified dermatitis: Secondary | ICD-10-CM | POA: Diagnosis not present

## 2024-01-24 DIAGNOSIS — L820 Inflamed seborrheic keratosis: Secondary | ICD-10-CM | POA: Diagnosis not present

## 2024-01-24 NOTE — Telephone Encounter (Signed)
 Copied from CRM [id number]. Topic: Referral - Question >> Jan 24, 2024  9:27 AM Sue [name] wrote: Reason for CRM: Patient called in due to receiving a message on mychart regarding an appointment needing to be made advised gastroenterologist was giving a call to schedule an appointment patient then stated she's had one already and was told she wouldn't have to come back in 5 yrs. Reached out to [facility] for more information and was advised cologuard test came back irregular and they want to check if it's something important advised patient of information and she stated she has not taken a cologuard test recently and would like to speak with someone regarding referral. Please call [phone]

## 2024-01-25 ENCOUNTER — Ambulatory Visit (INDEPENDENT_AMBULATORY_CARE_PROVIDER_SITE_OTHER): Admitting: Obstetrics and Gynecology

## 2024-01-25 ENCOUNTER — Ambulatory Visit (INDEPENDENT_AMBULATORY_CARE_PROVIDER_SITE_OTHER)

## 2024-01-25 ENCOUNTER — Encounter: Payer: Self-pay | Admitting: Obstetrics and Gynecology

## 2024-01-25 VITALS — BP 142/72 | HR 59 | Ht 64.0 in | Wt 134.5 lb

## 2024-01-25 DIAGNOSIS — D3911 Neoplasm of uncertain behavior of right ovary: Secondary | ICD-10-CM

## 2024-01-25 DIAGNOSIS — N83201 Unspecified ovarian cyst, right side: Secondary | ICD-10-CM | POA: Diagnosis not present

## 2024-01-25 NOTE — Telephone Encounter (Signed)
 Spoke with the pt and was able to explain to her that she is needing a colonoscopy sooner than the 5 year mark due to the abnormal findings in 2022 of her cologuard test.

## 2024-01-25 NOTE — Progress Notes (Signed)
 73 y.o. G1P1 female on HRT (declines progestin), right ovarian cyst here for US  surveillance. Long standing boyfriend.   Reports stopping estrogen since last apointment.  Patient initially presented 08/09/2023 for surveillance of endometrium on estrogen only hormone replacement.  Patient has declined progesterone therapy.   08/09/2023 TVUS revealed 5.7 cm simple cyst with a single thin septation. ORADS 2. 08/17/2023 CA125 9 Patient remains asymptomatic today.  No additional bleeding or pelvic pain.  Last mammogram: 10/24/23 BI-RADS 1, density C  GYN HISTORY: H/O LEEP 2008   OB History  Gravida Para Term Preterm AB Living  1 1    1   SAB IAB Ectopic Multiple Live Births          # Outcome Date GA Lbr Len/2nd Weight Sex Type Anes PTL Lv  1 Para             Obstetric Comments  1 son+ 1 adopted daughter    Past Medical History:  Diagnosis Date   Anxiety    Cataract    Diverticulitis    FRACTURE, ARM, LEFT ~ 1960   FRACTURE, FOOT ~ 1960   FRACTURE, INDEX FINGER, LEFT ~ 1960   Heart murmur    patient denies this dx on 01/28/21   HYPERTENSION 11/15/2007   INSOMNIA, CHRONIC 06/17/2009   MITRAL VALVE PROLAPSE, HX OF    OSTEOARTHRITIS 11/15/2007   Subdural hematoma (HCC) 03/2015   S/P fall    Past Surgical History:  Procedure Laterality Date   CATARACT EXTRACTION W/ INTRAOCULAR LENS IMPLANT Left 01/2015   COLON RESECTION N/A 07/28/2016   Procedure: LAPAROSCOPIC ASSISTED  COLOSTOMY TAKEDOWN WITH COLORECTAL ANASTOMOSIS;  Surgeon: Derral Flick, MD;  Location: [facility] OR;  Service: General;  Laterality: N/A;   COLON SURGERY     COLONOSCOPY     COLONOSCOPY WITH PROPOFOL  N/A 01/29/2021   Procedure: COLONOSCOPY WITH PROPOFOL ;  Surgeon: Normie Becton., MD;  Location: [facility] ENDOSCOPY;  Service: Gastroenterology;  Laterality: N/A;   COLONOSCOPY WITH PROPOFOL  N/A 10/13/2022   Procedure: COLONOSCOPY WITH PROPOFOL ;  Surgeon: Brice Campi Albino Alu., MD;  Location: [facility] ENDOSCOPY;   Service: Gastroenterology;  Laterality: N/A;   COLOSTOMY TAKEDOWN  07/28/2016   laparoscopic converted to open take down of end colostomy with colo-rectostomy/notes 07/28/2016   ENDOSCOPIC MUCOSAL RESECTION N/A 01/29/2021   Procedure: ENDOSCOPIC MUCOSAL RESECTION;  Surgeon: Normie Becton., MD;  Location: [facility] ENDOSCOPY;  Service: Gastroenterology;  Laterality: N/A;   EYE SURGERY     HEMOSTASIS CLIP PLACEMENT  01/29/2021   Procedure: HEMOSTASIS CLIP PLACEMENT;  Surgeon: Normie Becton., MD;  Location: [facility] ENDOSCOPY;  Service: Gastroenterology;;   PARTIAL COLECTOMY N/A 02/11/2016   Procedure: PARTIAL COLECTOMY with colostomy;  Surgeon: Derral Flick, MD;  Location: [facility] OR;  Service: General;  Laterality: N/A;   POLYPECTOMY  01/29/2021   Procedure: POLYPECTOMY;  Surgeon: Normie Becton., MD;  Location: [facility] ENDOSCOPY;  Service: Gastroenterology;;   POLYPECTOMY  10/13/2022   Procedure: POLYPECTOMY;  Surgeon: Normie Becton., MD;  Location: [facility] ENDOSCOPY;  Service: Gastroenterology;;   SUBMUCOSAL LIFTING INJECTION  01/29/2021   Procedure: SUBMUCOSAL LIFTING INJECTION;  Surgeon: Normie Becton., MD;  Location: [facility] ENDOSCOPY;  Service: Gastroenterology;;   TONSILLECTOMY      Current Outpatient Medications on File Prior to Visit  Medication Sig Dispense Refill   ALPRAZolam  (XANAX ) 0.25 MG tablet TAKE 1 TABLET BY MOUTH THREE TIMES A DAY AS NEEDED FOR ANXIETY 90 tablet 1   AMBULATORY NON FORMULARY MEDICATION Medication  Name: Diltiazem gel 2 % apply pea sized amount to your rectum twice a day for 4 weeks 30 g 0   clobetasol  ointment (TEMOVATE ) 0.05 % Apply 1 application  topically daily as needed (Rash).     Dapsone 5 % topical gel SMARTSIG:1 Sparingly Topical Daily     diclofenac  (VOLTAREN ) 75 MG EC tablet Take 75 mg by mouth 2 (two) times daily as needed.     erythromycin base (E-MYCIN) 500 MG tablet SMARTSIG:4 Tablet(s) By Mouth     ESTRACE  VAGINAL 0.1 MG/GM  vaginal cream Place 0.25 Applicatorfuls vaginally once a week. 42.5 g 2   FINACEA 15 % gel Apply 1 application  topically daily as needed (Rosacea).     meloxicam  (MOBIC ) 7.5 MG tablet Take 1 tablet (7.5 mg total) by mouth daily as needed for pain. 90 tablet 0   metoprolol  succinate (TOPROL -XL) 25 MG 24 hr tablet Take 1 tablet (25 mg total) by mouth every morning. Additional 25 mg if needed at bedtime 180 tablet 3   Multiple Vitamin (MULTIVITAMIN) capsule Take 1 capsule by mouth daily.     Multiple Vitamins-Minerals (HAIR SKIN & NAILS) TABS Take 1 tablet by mouth daily. GNC     OVER THE COUNTER MEDICATION Take 1 tablet by mouth 2 (two) times daily. Triflex     OVER THE COUNTER MEDICATION Take 2 tablets by mouth daily. Hydroeye     OVER THE COUNTER MEDICATION Take 1 tablet by mouth daily. Calcimate plus     potassium chloride  (KLOR-CON ) 8 MEQ tablet TAKE 1 TABLET BY MOUTH EVERY DAY 90 tablet 3   triamterene -hydrochlorothiazide  (MAXZIDE -25) 37.5-25 MG tablet Take 1 tablet by mouth daily. 90 tablet 3   XIIDRA  5 % SOLN Place 1 drop into both eyes 2 (two) times daily.  2   zolpidem  (AMBIEN ) 5 MG tablet TAKE 1 TABLET BY MOUTH EVERY DAY AT BEDTIME AS NEEDED FOR SLEEP 90 tablet 1   No current facility-administered medications on file prior to visit.    Allergies  Allergen Reactions   Clindamycin Anaphylaxis   Aspirin     bruising   Fish Oil     bruising   Latex     REACTION: sores in mouth   Penicillins Hives    Has patient had a PCN reaction causing immediate rash, facial/tongue/throat swelling, SOB or lightheadedness with hypotension: Yes Has patient had a PCN reaction causing severe rash involving mucus membranes or skin necrosis: No Has patient had a PCN reaction that required hospitalization No Has patient had a PCN reaction occurring within the last 10 years: No If all of the above answers are "NO", then may proceed with Cephalosporin use.       PE Today's Vitals   01/25/24 1011   BP: (!) 142/72  Pulse: (!) 59  SpO2: 97%  Weight: 134 lb 8 oz (61 kg)  Height: 5\' 4"  (1.626 m)   Body mass index is 23.09 kg/m.  Physical Exam Vitals reviewed.  Constitutional:      General: She is not in acute distress.    Appearance: Normal appearance.  HENT:     Head: Normocephalic and atraumatic.     Nose: Nose normal.  Eyes:     Extraocular Movements: Extraocular movements intact.     Conjunctiva/sclera: Conjunctivae normal.  Pulmonary:     Effort: Pulmonary effort is normal.  Musculoskeletal:        General: Normal range of motion.     Cervical back: Normal range of  motion.  Neurological:     General: No focal deficit present.     Mental Status: She is alert.  Psychiatric:        Mood and Affect: Mood normal.        Behavior: Behavior normal.    01/25/24 TVUS: Indications: Right ovarian cyst Comparison: 08/16/2023 TVUS   Findings:    Uterus: 4.8 x 3.5 x 3.5 cm, retroverted uterus.  3 fibroids measured, the largest measuring 1.6 cm. Endometrial thickness: 2 mm. Left ovary: 1.5 x 0.9 x 0.8 cm.  Normal-appearing Right ovary: 5.6 x 5.3 x 4.3 cm.  Simple, avascular cyst of left ovary present measuring 5.8 x 3.4 cm. No free fluid.   Impression:  Stable right ovarian cyst, when compared to December 2024 TVUS. Stable uterine fibroids.   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  Stable 5 cm cyst of right ovary. Reviewed likely benign cyst, however atypical after menopause. Patient was on ERT.  Recommend TM again today and in 6 months. Declines repeat TM today, agreeable to repeat with annual labs in Nov 2025. Discussed 1 yr TVUS for Dec 2025 at that time.  Romaine Closs, MD

## 2024-04-04 ENCOUNTER — Other Ambulatory Visit: Payer: Self-pay | Admitting: Internal Medicine

## 2024-05-01 ENCOUNTER — Encounter: Payer: Self-pay | Admitting: Podiatry

## 2024-05-01 ENCOUNTER — Ambulatory Visit (INDEPENDENT_AMBULATORY_CARE_PROVIDER_SITE_OTHER): Admitting: Podiatry

## 2024-05-01 DIAGNOSIS — D2371 Other benign neoplasm of skin of right lower limb, including hip: Secondary | ICD-10-CM | POA: Diagnosis not present

## 2024-05-01 DIAGNOSIS — D2372 Other benign neoplasm of skin of left lower limb, including hip: Secondary | ICD-10-CM

## 2024-05-01 NOTE — Progress Notes (Signed)
 She presents today for a chief complaint of 2 small calluses on her left foot and 1 on her right foot.  Objective: Vitals are stable alert oriented x 3.  There is no erythema edema salines drainage noted she does have benign skin lesions which appear to be porokeratotic in nature.  Assessment: Benign skin lesions bilateral.  Plan: Debridement of benign skin lesions bilateral follow-up with us  on an as-needed basis.

## 2024-05-21 DIAGNOSIS — H04123 Dry eye syndrome of bilateral lacrimal glands: Secondary | ICD-10-CM | POA: Diagnosis not present

## 2024-05-21 DIAGNOSIS — Z961 Presence of intraocular lens: Secondary | ICD-10-CM | POA: Diagnosis not present

## 2024-05-21 DIAGNOSIS — H52203 Unspecified astigmatism, bilateral: Secondary | ICD-10-CM | POA: Diagnosis not present

## 2024-06-25 DIAGNOSIS — H10411 Chronic giant papillary conjunctivitis, right eye: Secondary | ICD-10-CM | POA: Diagnosis not present

## 2024-06-25 DIAGNOSIS — H04123 Dry eye syndrome of bilateral lacrimal glands: Secondary | ICD-10-CM | POA: Diagnosis not present

## 2024-07-16 DIAGNOSIS — M9903 Segmental and somatic dysfunction of lumbar region: Secondary | ICD-10-CM | POA: Diagnosis not present

## 2024-07-17 DIAGNOSIS — H02834 Dermatochalasis of left upper eyelid: Secondary | ICD-10-CM | POA: Diagnosis not present

## 2024-07-17 DIAGNOSIS — H02831 Dermatochalasis of right upper eyelid: Secondary | ICD-10-CM | POA: Diagnosis not present

## 2024-07-23 ENCOUNTER — Ambulatory Visit: Admitting: Obstetrics and Gynecology

## 2024-07-23 ENCOUNTER — Encounter: Payer: Self-pay | Admitting: Obstetrics and Gynecology

## 2024-07-23 VITALS — BP 160/70 | HR 66 | Ht 62.99 in | Wt 137.2 lb

## 2024-07-23 DIAGNOSIS — Z9189 Other specified personal risk factors, not elsewhere classified: Secondary | ICD-10-CM | POA: Diagnosis not present

## 2024-07-23 DIAGNOSIS — M9903 Segmental and somatic dysfunction of lumbar region: Secondary | ICD-10-CM | POA: Diagnosis not present

## 2024-07-23 DIAGNOSIS — N83299 Other ovarian cyst, unspecified side: Secondary | ICD-10-CM

## 2024-07-23 DIAGNOSIS — T07XXXA Unspecified multiple injuries, initial encounter: Secondary | ICD-10-CM

## 2024-07-23 DIAGNOSIS — Z01419 Encounter for gynecological examination (general) (routine) without abnormal findings: Secondary | ICD-10-CM

## 2024-07-23 DIAGNOSIS — E2839 Other primary ovarian failure: Secondary | ICD-10-CM

## 2024-07-23 MED ORDER — CLOBETASOL PROPIONATE 0.05 % EX OINT: 1.0000 | TOPICAL_OINTMENT | Freq: Every day | CUTANEOUS | 1 refills | Status: AC | PRN

## 2024-07-23 MED ORDER — ESTRADIOL 0.01 % VA CREA: 1.0000 | TOPICAL_CREAM | Freq: Every day | VAGINAL | 0 refills | Status: AC

## 2024-07-23 NOTE — Progress Notes (Signed)
 73 y.o. y.o. female here for annual medicare exam. No LMP recorded (lmp unknown). Patient is postmenopausal.    08/09/2023 TVUS revealed 5.7 cm simple cyst with a single thin septation. ORADS 2. 08/17/2023 CA125 9 Patient remains asymptomatic today.  No additional bleeding or pelvic pain.  Last mammogram: 10/24/23 BI-RADS 1, density C  GYN HISTORY: H/O LEEP 2008 normal since then Health Maintenance: Pap:  12/28/19 History of abnormal Pap:  no MMG:  10/24/23 BMD:   11/03/2016 h/o fractures Colonoscopy: 10/13/22 Body mass index is 24.31 kg/m.     Blood pressure (!) 160/70, pulse 66, height 5' 2.99 (1.6 m), weight 137 lb 3.2 oz (62.2 kg), SpO2 98%.  No results found for: DIAGPAP, HPVHIGH, ADEQPAP  GYN HISTORY: No results found for: DIAGPAP, HPVHIGH, ADEQPAP  OB History  Gravida Para Term Preterm AB Living  1 1    1   SAB IAB Ectopic Multiple Live Births          # Outcome Date GA Lbr Len/2nd Weight Sex Type Anes PTL Lv  1 Para             Obstetric Comments  1 son+ 1 adopted daughter    Past Medical History:  Diagnosis Date   Anxiety    Cataract    Diverticulitis    FRACTURE, ARM, LEFT ~ 1960   FRACTURE, FOOT ~ 1960   FRACTURE, INDEX FINGER, LEFT ~ 1960   Heart murmur    patient denies this dx on 01/28/21   HYPERTENSION 11/15/2007   INSOMNIA, CHRONIC 06/17/2009   MITRAL VALVE PROLAPSE, HX OF    OSTEOARTHRITIS 11/15/2007   Subdural hematoma (HCC) 03/2015   S/P fall    Past Surgical History:  Procedure Laterality Date   CATARACT EXTRACTION W/ INTRAOCULAR LENS IMPLANT Left 01/2015   COLON RESECTION N/A 07/28/2016   Procedure: LAPAROSCOPIC ASSISTED  COLOSTOMY TAKEDOWN WITH COLORECTAL ANASTOMOSIS;  Surgeon: Herlene Beverley Bureau, MD;  Location: [facility] OR;  Service: General;  Laterality: N/A;   COLON SURGERY     COLONOSCOPY     COLONOSCOPY WITH PROPOFOL  N/A 01/29/2021   Procedure: COLONOSCOPY WITH PROPOFOL ;  Surgeon: Wilhelmenia Aloha Raddle., MD;   Location: [facility] ENDOSCOPY;  Service: Gastroenterology;  Laterality: N/A;   COLONOSCOPY WITH PROPOFOL  N/A 10/13/2022   Procedure: COLONOSCOPY WITH PROPOFOL ;  Surgeon: Mansouraty, Aloha Raddle., MD;  Location: [facility] ENDOSCOPY;  Service: Gastroenterology;  Laterality: N/A;   COLOSTOMY TAKEDOWN  07/28/2016   laparoscopic converted to open take down of end colostomy with colo-rectostomy/notes 07/28/2016   ENDOSCOPIC MUCOSAL RESECTION N/A 01/29/2021   Procedure: ENDOSCOPIC MUCOSAL RESECTION;  Surgeon: Wilhelmenia Aloha Raddle., MD;  Location: [facility] ENDOSCOPY;  Service: Gastroenterology;  Laterality: N/A;   EYE SURGERY     HEMOSTASIS CLIP PLACEMENT  01/29/2021   Procedure: HEMOSTASIS CLIP PLACEMENT;  Surgeon: Wilhelmenia Aloha Raddle., MD;  Location: [facility] ENDOSCOPY;  Service: Gastroenterology;;   PARTIAL COLECTOMY N/A 02/11/2016   Procedure: PARTIAL COLECTOMY with colostomy;  Surgeon: Herlene Beverley Bureau, MD;  Location: [facility] OR;  Service: General;  Laterality: N/A;   POLYPECTOMY  01/29/2021   Procedure: POLYPECTOMY;  Surgeon: Wilhelmenia Aloha Raddle., MD;  Location: [facility] ENDOSCOPY;  Service: Gastroenterology;;   POLYPECTOMY  10/13/2022   Procedure: POLYPECTOMY;  Surgeon: Wilhelmenia Aloha Raddle., MD;  Location: [facility] ENDOSCOPY;  Service: Gastroenterology;;   SUBMUCOSAL LIFTING INJECTION  01/29/2021   Procedure: SUBMUCOSAL LIFTING INJECTION;  Surgeon: Wilhelmenia Aloha Raddle., MD;  Location: [facility] ENDOSCOPY;  Service: Gastroenterology;;   TONSILLECTOMY  Current Outpatient Medications on File Prior to Visit  Medication Sig Dispense Refill   ALPRAZolam  (XANAX ) 0.25 MG tablet TAKE 1 TABLET BY MOUTH THREE TIMES A DAY AS NEEDED FOR ANXIETY 90 tablet 1   AMBULATORY NON FORMULARY MEDICATION Medication Name: Diltiazem gel 2 % apply pea sized amount to your rectum twice a day for 4 weeks 30 g 0   clobetasol  ointment (TEMOVATE ) 0.05 % Apply 1 application  topically daily as needed (Rash).     Dapsone 5 % topical gel SMARTSIG:1 Sparingly  Topical Daily     diclofenac  (VOLTAREN ) 75 MG EC tablet Take 75 mg by mouth 2 (two) times daily as needed.     erythromycin base (E-MYCIN) 500 MG tablet SMARTSIG:4 Tablet(s) By Mouth     ESTRACE  VAGINAL 0.1 MG/GM vaginal cream Place 0.25 Applicatorfuls vaginally once a week. 42.5 g 2   FINACEA 15 % gel Apply 1 application  topically daily as needed (Rosacea).     meloxicam  (MOBIC ) 7.5 MG tablet Take 1 tablet (7.5 mg total) by mouth daily as needed for pain. 90 tablet 0   metoprolol  succinate (TOPROL -XL) 25 MG 24 hr tablet Take 1 tablet (25 mg total) by mouth every morning. Additional 25 mg if needed at bedtime 180 tablet 3   Multiple Vitamin (MULTIVITAMIN) capsule Take 1 capsule by mouth daily.     Multiple Vitamins-Minerals (HAIR SKIN & NAILS) TABS Take 1 tablet by mouth daily. GNC     OVER THE COUNTER MEDICATION Take 1 tablet by mouth 2 (two) times daily. Triflex     OVER THE COUNTER MEDICATION Take 2 tablets by mouth daily. Hydroeye     OVER THE COUNTER MEDICATION Take 1 tablet by mouth daily. Calcimate plus     potassium chloride  (KLOR-CON ) 8 MEQ tablet TAKE 1 TABLET BY MOUTH EVERY DAY 90 tablet 3   triamterene -hydrochlorothiazide  (MAXZIDE -25) 37.5-25 MG tablet Take 1 tablet by mouth daily. 90 tablet 3   XIIDRA  5 % SOLN Place 1 drop into both eyes 2 (two) times daily.  2   zolpidem  (AMBIEN ) 5 MG tablet TAKE 1 TABLET BY MOUTH EVERY DAY AT BEDTIME AS NEEDED FOR SLEEP 90 tablet 1   No current facility-administered medications on file prior to visit.    Social History   Socioeconomic History   Marital status: Divorced    Spouse name: Not on file   Number of children: 2   Years of education: Not on file   Highest education level: Bachelor's degree (e.g., BA, AB, BS)  Occupational History   Occupation: Home maker  Tobacco Use   Smoking status: Never    Passive exposure: Never   Smokeless tobacco: Never  Vaping Use   Vaping status: Never Used  Substance and Sexual Activity   Alcohol  use: Not Currently    Comment: occasional wine   Drug use: No   Sexual activity: Not Currently    Partners: Male    Birth control/protection: Post-menopausal    Comment: older than 14 sexual debut, more than 5 lifetime partners  Other Topics Concern   Not on file  Social History Narrative   [employer], [place]    Married '72-17 yrs then divorced; remarried '90 - 4 yrs divorced. 1 boy - '75; 1 daughter '83. Single partner since 2001. Socially active, riding horses not hunting but jumping little jumps. Lost her mother Spring '12 - doing OK with loss.   Social Drivers of Health   Financial Resource Strain: Low Risk  (12/14/2023)  Overall Financial Resource Strain (CARDIA)    Difficulty of Paying Living Expenses: Not hard at all  Food Insecurity: No Food Insecurity (12/14/2023)   Hunger Vital Sign    Worried About Running Out of Food in the Last Year: Never true    Ran Out of Food in the Last Year: Never true  Transportation Needs: No Transportation Needs (12/14/2023)   PRAPARE - Administrator, Civil Service (Medical): No    Lack of Transportation (Non-Medical): No  Physical Activity: Sufficiently Active (12/14/2023)   Exercise Vital Sign    Days of Exercise per Week: 5 days    Minutes of Exercise per Session: 50 min  Stress: No Stress Concern Present (12/14/2023)   [employer] of Occupational Health - Occupational Stress Questionnaire    Feeling of Stress : Not at all  Social Connections: Moderately Integrated (12/14/2023)   Social Connection and Isolation Panel    Frequency of Communication with Friends and Family: More than three times a week    Frequency of Social Gatherings with Friends and Family: Three times a week    Attends Religious Services: More than 4 times per year    Active Member of Clubs or Organizations: Yes    Attends Banker Meetings: More than 4 times per year    Marital Status: Divorced  Intimate Partner Violence: Not At Risk  (12/14/2023)   Humiliation, Afraid, Rape, and Kick questionnaire    Fear of Current or Ex-Partner: No    Emotionally Abused: No    Physically Abused: No    Sexually Abused: No    Family History  Problem Relation Age of Onset   Breast cancer Mother    Hypertension Father    Cancer Father        LUNG   Parkinsonism Other    Colon cancer Neg Hx    Esophageal cancer Neg Hx    Colon polyps Neg Hx    Stomach cancer Neg Hx    Inflammatory bowel disease Neg Hx    Liver disease Neg Hx    Pancreatic cancer Neg Hx    Rectal cancer Neg Hx      Allergies  Allergen Reactions   Clindamycin Anaphylaxis   Aspirin     bruising   Fish Oil     bruising   Latex     REACTION: sores in mouth   Penicillins Hives    Has patient had a PCN reaction causing immediate rash, facial/tongue/throat swelling, SOB or lightheadedness with hypotension: Yes Has patient had a PCN reaction causing severe rash involving mucus membranes or skin necrosis: No Has patient had a PCN reaction that required hospitalization No Has patient had a PCN reaction occurring within the last 10 years: No If all of the above answers are NO, then may proceed with Cephalosporin use.       Patient's last menstrual period was No LMP recorded (lmp unknown). Patient is postmenopausal..             Review of Systems Alls systems reviewed and are negative.     Physical Exam Constitutional:      Appearance: Normal appearance.  Genitourinary:     Vulva and urethral meatus normal.     No lesions in the vagina.     Right Labia: No rash, lesions or skin changes.    Left Labia: No lesions, skin changes or rash.    No vaginal discharge or tenderness.     No vaginal prolapse  present.    No vaginal atrophy present.     Right Adnexa: not tender, not palpable and no mass present.    Left Adnexa: not tender, not palpable and no mass present.    No cervical motion tenderness or discharge.     Uterus is not enlarged, tender or  irregular.  Breasts:    Right: Normal.     Left: Normal.  HENT:     Head: Normocephalic.  Neck:     Thyroid : No thyroid  mass, thyromegaly or thyroid  tenderness.  Cardiovascular:     Rate and Rhythm: Normal rate and regular rhythm.     Heart sounds: Normal heart sounds, S1 normal and S2 normal.  Pulmonary:     Effort: Pulmonary effort is normal.     Breath sounds: Normal breath sounds and air entry.  Abdominal:     General: There is no distension.     Palpations: Abdomen is soft. There is no mass.     Tenderness: There is no abdominal tenderness. There is no guarding or rebound.  Musculoskeletal:        General: Normal range of motion.     Cervical back: Full passive range of motion without pain, normal range of motion and neck supple. No tenderness.     Right lower leg: No edema.     Left lower leg: No edema.  Neurological:     Mental Status: She is alert.  Skin:    General: Skin is warm.  Psychiatric:        Mood and Affect: Mood normal.        Behavior: Behavior normal.        Thought Content: Thought content normal.  Vitals and nursing note reviewed. Exam conducted with a chaperone present.      Erica CMA was present for the exam  A:         [facility] GYN exam             Stable ovarian cyst                 P:        Pap smear not indicated Encouraged annual mammogram screening Colon cancer screening up-to-date DXA ordered today Labs and immunizations to do with PMD Discussed breast self exams Encouraged healthy lifestyle practices Encouraged Vit D and Calcium   No follow-ups on file.  Sue [name] Carpen

## 2024-07-26 DIAGNOSIS — L570 Actinic keratosis: Secondary | ICD-10-CM | POA: Diagnosis not present

## 2024-07-26 DIAGNOSIS — Z85828 Personal history of other malignant neoplasm of skin: Secondary | ICD-10-CM | POA: Diagnosis not present

## 2024-07-26 DIAGNOSIS — L812 Freckles: Secondary | ICD-10-CM | POA: Diagnosis not present

## 2024-07-26 DIAGNOSIS — L718 Other rosacea: Secondary | ICD-10-CM | POA: Diagnosis not present

## 2024-07-26 DIAGNOSIS — L821 Other seborrheic keratosis: Secondary | ICD-10-CM | POA: Diagnosis not present

## 2024-07-26 DIAGNOSIS — L820 Inflamed seborrheic keratosis: Secondary | ICD-10-CM | POA: Diagnosis not present

## 2024-07-26 DIAGNOSIS — D1801 Hemangioma of skin and subcutaneous tissue: Secondary | ICD-10-CM | POA: Diagnosis not present

## 2024-07-30 DIAGNOSIS — M9903 Segmental and somatic dysfunction of lumbar region: Secondary | ICD-10-CM | POA: Diagnosis not present

## 2024-08-03 ENCOUNTER — Other Ambulatory Visit: Payer: Self-pay | Admitting: Internal Medicine

## 2024-08-06 ENCOUNTER — Encounter: Payer: Self-pay | Admitting: Internal Medicine

## 2024-08-07 ENCOUNTER — Other Ambulatory Visit: Payer: Self-pay | Admitting: Internal Medicine

## 2024-08-07 DIAGNOSIS — Z0000 Encounter for general adult medical examination without abnormal findings: Secondary | ICD-10-CM

## 2024-08-07 DIAGNOSIS — D122 Benign neoplasm of ascending colon: Secondary | ICD-10-CM

## 2024-08-07 DIAGNOSIS — D374 Neoplasm of uncertain behavior of colon: Secondary | ICD-10-CM

## 2024-08-07 DIAGNOSIS — R195 Other fecal abnormalities: Secondary | ICD-10-CM

## 2024-08-07 DIAGNOSIS — E785 Hyperlipidemia, unspecified: Secondary | ICD-10-CM

## 2024-08-07 DIAGNOSIS — I2583 Coronary atherosclerosis due to lipid rich plaque: Secondary | ICD-10-CM

## 2024-08-07 DIAGNOSIS — N83209 Unspecified ovarian cyst, unspecified side: Secondary | ICD-10-CM

## 2024-08-07 DIAGNOSIS — D3911 Neoplasm of uncertain behavior of right ovary: Secondary | ICD-10-CM

## 2024-08-08 ENCOUNTER — Other Ambulatory Visit

## 2024-08-08 DIAGNOSIS — N83209 Unspecified ovarian cyst, unspecified side: Secondary | ICD-10-CM

## 2024-08-08 DIAGNOSIS — D3911 Neoplasm of uncertain behavior of right ovary: Secondary | ICD-10-CM

## 2024-08-08 DIAGNOSIS — D374 Neoplasm of uncertain behavior of colon: Secondary | ICD-10-CM

## 2024-08-08 DIAGNOSIS — D122 Benign neoplasm of ascending colon: Secondary | ICD-10-CM | POA: Diagnosis not present

## 2024-08-08 DIAGNOSIS — I2583 Coronary atherosclerosis due to lipid rich plaque: Secondary | ICD-10-CM

## 2024-08-08 DIAGNOSIS — E785 Hyperlipidemia, unspecified: Secondary | ICD-10-CM

## 2024-08-08 DIAGNOSIS — Z0000 Encounter for general adult medical examination without abnormal findings: Secondary | ICD-10-CM | POA: Diagnosis not present

## 2024-08-08 LAB — URINALYSIS, ROUTINE W REFLEX MICROSCOPIC
Bilirubin Urine: NEGATIVE
Hgb urine dipstick: NEGATIVE
Ketones, ur: NEGATIVE
Nitrite: NEGATIVE
Specific Gravity, Urine: 1.015 (ref 1.000–1.030)
Total Protein, Urine: NEGATIVE
Urine Glucose: NEGATIVE
Urobilinogen, UA: 0.2 (ref 0.0–1.0)
pH: 8 (ref 5.0–8.0)

## 2024-08-08 LAB — CBC WITH DIFFERENTIAL/PLATELET
Basophils Absolute: 0 K/uL (ref 0.0–0.1)
Basophils Relative: 0.4 % (ref 0.0–3.0)
Eosinophils Absolute: 0.2 K/uL (ref 0.0–0.7)
Eosinophils Relative: 3.6 % (ref 0.0–5.0)
HCT: 40.9 % (ref 36.0–46.0)
Hemoglobin: 13.7 g/dL (ref 12.0–15.0)
Lymphocytes Relative: 37.5 % (ref 12.0–46.0)
Lymphs Abs: 1.8 K/uL (ref 0.7–4.0)
MCHC: 33.6 g/dL (ref 30.0–36.0)
MCV: 91.3 fl (ref 78.0–100.0)
Monocytes Absolute: 0.6 K/uL (ref 0.1–1.0)
Monocytes Relative: 11.4 % (ref 3.0–12.0)
Neutro Abs: 2.3 K/uL (ref 1.4–7.7)
Neutrophils Relative %: 47.1 % (ref 43.0–77.0)
Platelets: 285 K/uL (ref 150.0–400.0)
RBC: 4.48 Mil/uL (ref 3.87–5.11)
RDW: 13.7 % (ref 11.5–15.5)
WBC: 4.9 K/uL (ref 4.0–10.5)

## 2024-08-08 LAB — COMPREHENSIVE METABOLIC PANEL WITH GFR
ALT: 17 U/L (ref 0–35)
AST: 18 U/L (ref 0–37)
Albumin: 4.4 g/dL (ref 3.5–5.2)
Alkaline Phosphatase: 58 U/L (ref 39–117)
BUN: 21 mg/dL (ref 6–23)
CO2: 29 meq/L (ref 19–32)
Calcium: 9.3 mg/dL (ref 8.4–10.5)
Chloride: 101 meq/L (ref 96–112)
Creatinine, Ser: 0.75 mg/dL (ref 0.40–1.20)
GFR: 77.78 mL/min (ref >60.00)
Glucose, Bld: 91 mg/dL (ref 70–99)
Potassium: 3.8 meq/L (ref 3.5–5.1)
Sodium: 139 meq/L (ref 135–145)
Total Bilirubin: 0.5 mg/dL (ref 0.2–1.2)
Total Protein: 7 g/dL (ref 6.0–8.3)

## 2024-08-08 LAB — LIPID PANEL
Cholesterol: 210 mg/dL — ABNORMAL HIGH (ref 0–200)
HDL: 69.8 mg/dL (ref >39.00)
LDL Cholesterol: 124 mg/dL — ABNORMAL HIGH (ref 0–99)
NonHDL: 140.26
Total CHOL/HDL Ratio: 3
Triglycerides: 81 mg/dL (ref 0.0–149.0)
VLDL: 16.2 mg/dL (ref 0.0–40.0)

## 2024-08-08 LAB — TSH: TSH: 2.03 u[IU]/mL (ref 0.35–5.50)

## 2024-08-09 ENCOUNTER — Ambulatory Visit: Payer: Self-pay | Admitting: Internal Medicine

## 2024-08-10 LAB — CEA: CEA: 2.4 ng/mL

## 2024-08-13 ENCOUNTER — Ambulatory Visit: Payer: Medicare Other | Admitting: Internal Medicine

## 2024-08-13 ENCOUNTER — Encounter: Payer: Self-pay | Admitting: Internal Medicine

## 2024-08-13 VITALS — BP 164/86 | HR 64 | Ht 63.5 in | Wt 136.4 lb

## 2024-08-13 DIAGNOSIS — Z634 Disappearance and death of family member: Secondary | ICD-10-CM

## 2024-08-13 DIAGNOSIS — I2583 Coronary atherosclerosis due to lipid rich plaque: Secondary | ICD-10-CM | POA: Diagnosis not present

## 2024-08-13 DIAGNOSIS — I10 Essential (primary) hypertension: Secondary | ICD-10-CM | POA: Diagnosis not present

## 2024-08-13 DIAGNOSIS — Z0000 Encounter for general adult medical examination without abnormal findings: Secondary | ICD-10-CM

## 2024-08-13 DIAGNOSIS — F4321 Adjustment disorder with depressed mood: Secondary | ICD-10-CM

## 2024-08-13 DIAGNOSIS — M9903 Segmental and somatic dysfunction of lumbar region: Secondary | ICD-10-CM | POA: Diagnosis not present

## 2024-08-13 MED ORDER — TRAZODONE HCL 50 MG PO TABS: 25.0000 mg | ORAL_TABLET | Freq: Every evening | ORAL | 1 refills | Status: AC | PRN

## 2024-08-13 MED ORDER — OLMESARTAN MEDOXOMIL 20 MG PO TABS: 20.0000 mg | ORAL_TABLET | Freq: Every day | ORAL | 3 refills | Status: AC

## 2024-08-13 MED ORDER — METOPROLOL SUCCINATE ER 25 MG PO TB24: 25.0000 mg | ORAL_TABLET | Freq: Two times a day (BID) | ORAL | 3 refills | Status: AC

## 2024-08-13 MED ORDER — TRIAMTERENE-HCTZ 37.5-25 MG PO TABS: 1.0000 | ORAL_TABLET | Freq: Every day | ORAL | 3 refills | Status: AC

## 2024-08-13 NOTE — Assessment & Plan Note (Addendum)
 Cont w/Maxzide  every day, Toprol  25 mg bid Added olmesartan 20-40 mg/d

## 2024-08-13 NOTE — Assessment & Plan Note (Signed)
 Vit D3, K2, MK7 vitamins She has not been interested in taking a statin, etc.

## 2024-08-13 NOTE — Progress Notes (Signed)
 Subjective:  Patient ID: Sue [name], female    DOB: Dec 05, 1950  Age: 73 y.o. MRN: [id number]  CC: Annual Exam (Annual Exam)   HPI CARELI LUZADER presents for a well exam C/o elevated BP Son died at 30 in [place] - [place]  Outpatient Medications Prior to Visit  Medication Sig Dispense Refill   ALPRAZolam  (XANAX ) 0.25 MG tablet TAKE 1 TABLET BY MOUTH THREE TIMES A DAY AS NEEDED FOR ANXIETY 90 tablet 1   AMBULATORY NON FORMULARY MEDICATION Medication Name: Diltiazem gel 2 % apply pea sized amount to your rectum twice a day for 4 weeks 30 g 0   clobetasol  ointment (TEMOVATE ) 0.05 % Apply 1 Application topically daily as needed (Rash). 30 g 1   Dapsone 5 % topical gel SMARTSIG:1 Sparingly Topical Daily     diclofenac  (VOLTAREN ) 75 MG EC tablet Take 75 mg by mouth 2 (two) times daily as needed.     erythromycin base (E-MYCIN) 500 MG tablet SMARTSIG:4 Tablet(s) By Mouth     estradiol  (ESTRACE ) 0.01 % CREA vaginal cream Place 1 Applicatorful vaginally at bedtime. Use a dime size amount in the vagina and outer labia 3 times a week 42.5 g 0   FINACEA 15 % gel Apply 1 application  topically daily as needed (Rosacea).     meloxicam  (MOBIC ) 7.5 MG tablet Take 1 tablet (7.5 mg total) by mouth daily as needed for pain. 90 tablet 0   Multiple Vitamin (MULTIVITAMIN) capsule Take 1 capsule by mouth daily.     Multiple Vitamins-Minerals (HAIR SKIN & NAILS) TABS Take 1 tablet by mouth daily. GNC     OVER THE COUNTER MEDICATION Take 1 tablet by mouth 2 (two) times daily. Triflex     OVER THE COUNTER MEDICATION Take 2 tablets by mouth daily. Hydroeye     OVER THE COUNTER MEDICATION Take 1 tablet by mouth daily. Calcimate plus     potassium chloride  (KLOR-CON ) 8 MEQ tablet TAKE 1 TABLET BY MOUTH EVERY DAY 90 tablet 3   XIIDRA  5 % SOLN Place 1 drop into both eyes 2 (two) times daily.  2   metoprolol  succinate (TOPROL -XL) 25 MG 24 hr tablet Take 1 tablet (25 mg total) by mouth every morning. Additional 25  mg if needed at bedtime 180 tablet 3   triamterene -hydrochlorothiazide  (MAXZIDE -25) 37.5-25 MG tablet TAKE 1 TABLET BY MOUTH EVERY DAY 90 tablet 1   zolpidem  (AMBIEN ) 5 MG tablet TAKE 1 TABLET BY MOUTH EVERY DAY AT BEDTIME AS NEEDED FOR SLEEP 90 tablet 1   No facility-administered medications prior to visit.    ROS: Review of Systems  Constitutional:  Negative for activity change, appetite change, chills, fatigue and unexpected weight change.  HENT:  Negative for congestion, mouth sores and sinus pressure.   Eyes:  Negative for visual disturbance.  Respiratory:  Negative for cough and chest tightness.   Gastrointestinal:  Negative for abdominal pain and nausea.  Genitourinary:  Negative for difficulty urinating, frequency and vaginal pain.  Musculoskeletal:  Negative for back pain and gait problem.  Skin:  Negative for pallor and rash.  Neurological:  Negative for dizziness, tremors, weakness, numbness and headaches.  Psychiatric/Behavioral:  Negative for confusion and sleep disturbance.     Objective:  BP (!) 164/86   Pulse 64   Ht 5' 3.5 (1.613 m)   Wt 136 lb 6.4 oz (61.9 kg)   LMP  (LMP Unknown) Comment: not sexually active  SpO2 99%   BMI 23.78  kg/m   BP Readings from Last 3 Encounters:  08/13/24 (!) 164/86  07/23/24 (!) 160/70  01/25/24 (!) 142/72    Wt Readings from Last 3 Encounters:  08/13/24 136 lb 6.4 oz (61.9 kg)  07/23/24 137 lb 3.2 oz (62.2 kg)  01/25/24 134 lb 8 oz (61 kg)    Physical Exam Constitutional:      General: She is not in acute distress.    Appearance: She is well-developed.  HENT:     Head: Normocephalic.     Right Ear: External ear normal.     Left Ear: External ear normal.     Nose: Nose normal.  Eyes:     General:        Right eye: No discharge.        Left eye: No discharge.     Conjunctiva/sclera: Conjunctivae normal.     Pupils: Pupils are equal, round, and reactive to light.  Neck:     Thyroid : No thyromegaly.     Vascular:  No JVD.     Trachea: No tracheal deviation.  Cardiovascular:     Rate and Rhythm: Normal rate and regular rhythm.     Heart sounds: Normal heart sounds.  Pulmonary:     Effort: No respiratory distress.     Breath sounds: No stridor. No wheezing.  Abdominal:     General: Bowel sounds are normal. There is no distension.     Palpations: Abdomen is soft. There is no mass.     Tenderness: There is no abdominal tenderness. There is no guarding or rebound.  Musculoskeletal:        General: No tenderness.     Cervical back: Normal range of motion and neck supple. No rigidity.  Lymphadenopathy:     Cervical: No cervical adenopathy.  Skin:    Findings: No erythema or rash.  Neurological:     Cranial Nerves: No cranial nerve deficit.     Motor: No abnormal muscle tone.     Coordination: Coordination normal.     Deep Tendon Reflexes: Reflexes normal.  Psychiatric:        Behavior: Behavior normal.        Thought Content: Thought content normal.        Judgment: Judgment normal.     Lab Results  Component Value Date   WBC 4.9 08/08/2024   HGB 13.7 08/08/2024   HCT 40.9 08/08/2024   PLT 285.0 08/08/2024   GLUCOSE 91 08/08/2024   CHOL 210 (H) 08/08/2024   TRIG 81.0 08/08/2024   HDL 69.80 08/08/2024   LDLDIRECT 118.2 07/19/2013   LDLCALC 124 (H) 08/08/2024   ALT 17 08/08/2024   AST 18 08/08/2024   NA 139 08/08/2024   K 3.8 08/08/2024   CL 101 08/08/2024   CREATININE 0.75 08/08/2024   BUN 21 08/08/2024   CO2 29 08/08/2024   TSH 2.03 08/08/2024   INR 1.0 12/19/2020    MM 3D SCREENING MAMMOGRAM BILATERAL BREAST Result Date: 10/26/2023 CLINICAL DATA:  Screening. EXAM: DIGITAL SCREENING BILATERAL MAMMOGRAM WITH TOMOSYNTHESIS AND CAD TECHNIQUE: Bilateral screening digital craniocaudal and mediolateral oblique mammograms were obtained. Bilateral screening digital breast tomosynthesis was performed. The images were evaluated with computer-aided detection. COMPARISON:  Previous  exam(s). ACR Breast Density Category c: The breasts are heterogeneously dense, which may obscure small masses. FINDINGS: There are no findings suspicious for malignancy. IMPRESSION: No mammographic evidence of malignancy. A result letter of this screening mammogram will be mailed directly to the patient.  RECOMMENDATION: Screening mammogram in one year. (Code:SM-B-01Y) BI-RADS CATEGORY  1: Negative. Electronically Signed   By: Dina  Arceo M.D.   On: 10/26/2023 13:06    Assessment & Plan:   Problem List Items Addressed This Visit     Coronary atherosclerosis   Vit D3, K2, MK7 vitamins She has not been interested in taking a statin, etc.      Relevant Medications   olmesartan (BENICAR) 20 MG tablet   metoprolol  succinate (TOPROL -XL) 25 MG 24 hr tablet   triamterene -hydrochlorothiazide  (MAXZIDE -25) 37.5-25 MG tablet   Grief at loss of child   Son died at 8 in [place]      HTN (hypertension)   Maxzide  every day, Toprol  25 mg bid Added olmesartan 20-40 mg/d      Relevant Medications   olmesartan (BENICAR) 20 MG tablet   metoprolol  succinate (TOPROL -XL) 25 MG 24 hr tablet   triamterene -hydrochlorothiazide  (MAXZIDE -25) 37.5-25 MG tablet   Well adult exam - Primary     We discussed age appropriate health related issues, including available/recomended screening tests and vaccinations. Labs were ordered to be later reviewed . All questions were answered. We discussed one or more of the following - seat belt use, use of sunscreen/sun exposure exercise, fall risk reduction, second hand smoke exposure, firearm use and storage, seat belt use, a need for adhering to healthy diet and exercise. Labs were ordered.  All questions were answered.  Cologuard 2019,  last colon in 2024 - Dr Wilhelmenia A  cardiac CT scan for calcium score 57  in 2020 Not COVID vaccinated         Meds ordered this encounter  Medications   olmesartan (BENICAR) 20 MG tablet    Sig: Take 1 tablet (20 mg total) by  mouth daily.    Dispense:  90 tablet    Refill:  3   traZODone (DESYREL) 50 MG tablet    Sig: Take 0.5-1 tablets (25-50 mg total) by mouth at bedtime as needed for sleep.    Dispense:  90 tablet    Refill:  1   metoprolol  succinate (TOPROL -XL) 25 MG 24 hr tablet    Sig: Take 1 tablet (25 mg total) by mouth 2 (two) times daily.    Dispense:  180 tablet    Refill:  3   triamterene -hydrochlorothiazide  (MAXZIDE -25) 37.5-25 MG tablet    Sig: Take 1 tablet by mouth daily.    Dispense:  90 tablet    Refill:  3      Follow-up: Return in about 3 months (around 11/11/2024) for a follow-up visit.  Marolyn Noel, MD

## 2024-08-13 NOTE — Assessment & Plan Note (Addendum)
   We discussed age appropriate health related issues, including available/recomended screening tests and vaccinations. Labs were ordered to be later reviewed . All questions were answered. We discussed one or more of the following - seat belt use, use of sunscreen/sun exposure exercise, fall risk reduction, second hand smoke exposure, firearm use and storage, seat belt use, a need for adhering to healthy diet and exercise. Labs were ordered.  All questions were answered.  Cologuard 2019,  last colon in 2024 - Dr Wilhelmenia A  cardiac CT scan for calcium score 57  in 2020 Not COVID vaccinated

## 2024-08-13 NOTE — Assessment & Plan Note (Signed)
 Son died at 18 in [place]

## 2024-09-20 ENCOUNTER — Ambulatory Visit (HOSPITAL_BASED_OUTPATIENT_CLINIC_OR_DEPARTMENT_OTHER)
Admission: RE | Admit: 2024-09-20 | Discharge: 2024-09-20 | Disposition: A | Discharge status: Home / Self Care | Source: Ambulatory Visit | Attending: Obstetrics and Gynecology | Admitting: Obstetrics and Gynecology

## 2024-09-20 ENCOUNTER — Other Ambulatory Visit: Payer: Self-pay | Admitting: Internal Medicine

## 2024-09-20 ENCOUNTER — Ambulatory Visit: Payer: Self-pay | Admitting: Obstetrics and Gynecology

## 2024-09-20 DIAGNOSIS — Z1231 Encounter for screening mammogram for malignant neoplasm of breast: Secondary | ICD-10-CM

## 2024-09-20 DIAGNOSIS — T07XXXA Unspecified multiple injuries, initial encounter: Secondary | ICD-10-CM | POA: Insufficient documentation

## 2024-09-20 DIAGNOSIS — E2839 Other primary ovarian failure: Secondary | ICD-10-CM | POA: Insufficient documentation

## 2024-09-23 ENCOUNTER — Other Ambulatory Visit: Payer: Self-pay | Admitting: Internal Medicine

## 2024-10-24 ENCOUNTER — Ambulatory Visit

## 2024-11-12 ENCOUNTER — Ambulatory Visit: Admitting: Internal Medicine

## 2024-12-14 ENCOUNTER — Ambulatory Visit
